# Patient Record
Sex: Female | Born: 1955
Health system: Southern US, Community
[De-identification: ages and names within clinical notes are randomized; demographics above are authoritative.]

## PROBLEM LIST (undated history)

## (undated) DIAGNOSIS — I10 Essential (primary) hypertension: Secondary | ICD-10-CM

## (undated) DIAGNOSIS — J029 Acute pharyngitis, unspecified: Secondary | ICD-10-CM

## (undated) DIAGNOSIS — M810 Age-related osteoporosis without current pathological fracture: Secondary | ICD-10-CM

## (undated) DIAGNOSIS — E049 Nontoxic goiter, unspecified: Secondary | ICD-10-CM

## (undated) DIAGNOSIS — R350 Frequency of micturition: Secondary | ICD-10-CM

## (undated) DIAGNOSIS — K922 Gastrointestinal hemorrhage, unspecified: Secondary | ICD-10-CM

## (undated) DIAGNOSIS — R5383 Other fatigue: Secondary | ICD-10-CM

## (undated) DIAGNOSIS — Z5189 Encounter for other specified aftercare: Secondary | ICD-10-CM

## (undated) DIAGNOSIS — N952 Postmenopausal atrophic vaginitis: Principal | ICD-10-CM

## (undated) DIAGNOSIS — M1990 Unspecified osteoarthritis, unspecified site: Secondary | ICD-10-CM

## (undated) DIAGNOSIS — N760 Acute vaginitis: Secondary | ICD-10-CM

## (undated) DIAGNOSIS — K5790 Diverticulosis of intestine, part unspecified, without perforation or abscess without bleeding: Secondary | ICD-10-CM

## (undated) DIAGNOSIS — K8020 Calculus of gallbladder without cholecystitis without obstruction: Secondary | ICD-10-CM

## (undated) DIAGNOSIS — R5381 Other malaise: Secondary | ICD-10-CM

## (undated) DIAGNOSIS — B019 Varicella without complication: Secondary | ICD-10-CM

## (undated) DIAGNOSIS — K469 Unspecified abdominal hernia without obstruction or gangrene: Secondary | ICD-10-CM

## (undated) DIAGNOSIS — N8110 Cystocele, unspecified: Secondary | ICD-10-CM

## (undated) DIAGNOSIS — R011 Cardiac murmur, unspecified: Secondary | ICD-10-CM

## (undated) DIAGNOSIS — E785 Hyperlipidemia, unspecified: Principal | ICD-10-CM

## (undated) DIAGNOSIS — F529 Unspecified sexual dysfunction not due to a substance or known physiological condition: Secondary | ICD-10-CM

## (undated) DIAGNOSIS — T7840XA Allergy, unspecified, initial encounter: Secondary | ICD-10-CM

## (undated) DIAGNOSIS — S32040A Wedge compression fracture of fourth lumbar vertebra, initial encounter for closed fracture: Secondary | ICD-10-CM

## (undated) DIAGNOSIS — R05 Cough: Secondary | ICD-10-CM

## (undated) HISTORY — DX: Calculus of gallbladder without cholecystitis without obstruction: K80.20

## (undated) HISTORY — DX: Varicella without complication: B01.9

## (undated) HISTORY — DX: Acute vaginitis: N76.0

## (undated) HISTORY — DX: Diverticulosis of intestine, part unspecified, without perforation or abscess without bleeding: K57.90

## (undated) HISTORY — DX: Unspecified osteoarthritis, unspecified site: M19.90

## (undated) HISTORY — DX: Unspecified sexual dysfunction not due to a substance or known physiological condition: F52.9

## (undated) HISTORY — DX: Acute pharyngitis, unspecified: J02.9

## (undated) HISTORY — DX: Other fatigue: R53.83

## (undated) HISTORY — PX: OTHER SURGICAL HISTORY: SHX169

## (undated) HISTORY — DX: Essential (primary) hypertension: I10

## (undated) HISTORY — DX: Cardiac murmur, unspecified: R01.1

## (undated) HISTORY — DX: Gastrointestinal hemorrhage, unspecified: K92.2

## (undated) HISTORY — PX: HERNIA REPAIR: SHX51

## (undated) HISTORY — DX: Hyperlipidemia, unspecified: E78.5

## (undated) HISTORY — PX: VARICOSE VEIN SURGERY: SHX832

## (undated) HISTORY — DX: Nontoxic goiter, unspecified: E04.9

## (undated) HISTORY — PX: COLONOSCOPY: SHX174

## (undated) HISTORY — PX: BREAST EXCISIONAL BIOPSY: SUR124

## (undated) HISTORY — DX: Cough: R05

## (undated) HISTORY — DX: Other malaise: R53.81

## (undated) HISTORY — PX: BIOPSY THYROID: PRO38

## (undated) HISTORY — DX: Allergy, unspecified, initial encounter: T78.40XA

## (undated) HISTORY — PX: BREAST BIOPSY: SHX20

## (undated) HISTORY — PX: TONSILLECTOMY: SHX5217

## (undated) HISTORY — DX: Unspecified abdominal hernia without obstruction or gangrene: K46.9

## (undated) HISTORY — DX: Cystocele, unspecified: N81.10

## (undated) HISTORY — DX: Encounter for other specified aftercare: Z51.89

## (undated) HISTORY — DX: Wedge compression fracture of fourth lumbar vertebra, initial encounter for closed fracture: S32.040A

## (undated) HISTORY — DX: Age-related osteoporosis without current pathological fracture: M81.0

## (undated) HISTORY — DX: Frequency of micturition: R35.0

## (undated) HISTORY — DX: Postmenopausal atrophic vaginitis: N95.2

---

## 1986-07-12 HISTORY — PX: VAGINAL HYSTERECTOMY: SUR661

## 1996-07-12 HISTORY — PX: CHOLECYSTECTOMY: SHX55

## 1997-07-12 HISTORY — PX: ERCP: SHX60

## 1997-07-12 HISTORY — PX: ESOPHAGOGASTRODUODENOSCOPY: SHX1529

## 2002-12-04 ENCOUNTER — Encounter: Payer: Self-pay | Admitting: Family Medicine

## 2002-12-04 ENCOUNTER — Encounter: Admission: RE | Admit: 2002-12-04 | Discharge: 2002-12-04 | Payer: Self-pay | Admitting: Family Medicine

## 2003-12-04 ENCOUNTER — Other Ambulatory Visit: Admission: RE | Admit: 2003-12-04 | Discharge: 2003-12-04 | Payer: Self-pay | Admitting: Family Medicine

## 2004-12-16 ENCOUNTER — Ambulatory Visit (HOSPITAL_COMMUNITY): Admission: RE | Admit: 2004-12-16 | Discharge: 2004-12-16 | Payer: Self-pay | Admitting: Family Medicine

## 2005-12-14 ENCOUNTER — Encounter: Admission: RE | Admit: 2005-12-14 | Discharge: 2006-01-05 | Payer: Self-pay | Admitting: Family Medicine

## 2006-12-14 ENCOUNTER — Other Ambulatory Visit: Admission: RE | Admit: 2006-12-14 | Discharge: 2006-12-14 | Payer: Self-pay | Admitting: Family Medicine

## 2006-12-21 ENCOUNTER — Ambulatory Visit (HOSPITAL_COMMUNITY): Admission: RE | Admit: 2006-12-21 | Discharge: 2006-12-21 | Payer: Self-pay | Admitting: Family Medicine

## 2008-01-04 ENCOUNTER — Ambulatory Visit (HOSPITAL_COMMUNITY): Admission: RE | Admit: 2008-01-04 | Discharge: 2008-01-04 | Payer: Self-pay | Admitting: Gastroenterology

## 2009-05-09 ENCOUNTER — Ambulatory Visit (HOSPITAL_COMMUNITY): Admission: RE | Admit: 2009-05-09 | Discharge: 2009-05-09 | Payer: Self-pay | Admitting: Family Medicine

## 2011-01-25 ENCOUNTER — Ambulatory Visit (HOSPITAL_BASED_OUTPATIENT_CLINIC_OR_DEPARTMENT_OTHER)
Admission: RE | Admit: 2011-01-25 | Discharge: 2011-01-25 | Disposition: A | Discharge status: Home / Self Care | Payer: 59 | Source: Ambulatory Visit | Attending: Family Medicine | Admitting: Family Medicine

## 2011-01-25 ENCOUNTER — Other Ambulatory Visit: Payer: Self-pay | Admitting: Family Medicine

## 2011-01-25 ENCOUNTER — Ambulatory Visit (INDEPENDENT_AMBULATORY_CARE_PROVIDER_SITE_OTHER): Payer: 59 | Admitting: Family Medicine

## 2011-01-25 ENCOUNTER — Encounter: Payer: Self-pay | Admitting: Family Medicine

## 2011-01-25 DIAGNOSIS — M25569 Pain in unspecified knee: Secondary | ICD-10-CM | POA: Insufficient documentation

## 2011-01-25 DIAGNOSIS — K5730 Diverticulosis of large intestine without perforation or abscess without bleeding: Secondary | ICD-10-CM

## 2011-01-25 DIAGNOSIS — F659 Paraphilia, unspecified: Secondary | ICD-10-CM

## 2011-01-25 DIAGNOSIS — B059 Measles without complication: Secondary | ICD-10-CM | POA: Insufficient documentation

## 2011-01-25 DIAGNOSIS — N952 Postmenopausal atrophic vaginitis: Secondary | ICD-10-CM | POA: Insufficient documentation

## 2011-01-25 DIAGNOSIS — M25562 Pain in left knee: Secondary | ICD-10-CM

## 2011-01-25 DIAGNOSIS — M1710 Unilateral primary osteoarthritis, unspecified knee: Secondary | ICD-10-CM

## 2011-01-25 DIAGNOSIS — B269 Mumps without complication: Secondary | ICD-10-CM | POA: Insufficient documentation

## 2011-01-25 DIAGNOSIS — E049 Nontoxic goiter, unspecified: Secondary | ICD-10-CM | POA: Insufficient documentation

## 2011-01-25 DIAGNOSIS — I10 Essential (primary) hypertension: Secondary | ICD-10-CM | POA: Insufficient documentation

## 2011-01-25 DIAGNOSIS — Z889 Allergy status to unspecified drugs, medicaments and biological substances status: Secondary | ICD-10-CM

## 2011-01-25 DIAGNOSIS — F529 Unspecified sexual dysfunction not due to a substance or known physiological condition: Secondary | ICD-10-CM

## 2011-01-25 DIAGNOSIS — Z9109 Other allergy status, other than to drugs and biological substances: Secondary | ICD-10-CM

## 2011-01-25 DIAGNOSIS — IMO0002 Reserved for concepts with insufficient information to code with codable children: Secondary | ICD-10-CM | POA: Insufficient documentation

## 2011-01-25 DIAGNOSIS — M1990 Unspecified osteoarthritis, unspecified site: Secondary | ICD-10-CM

## 2011-01-25 DIAGNOSIS — B019 Varicella without complication: Secondary | ICD-10-CM | POA: Insufficient documentation

## 2011-01-25 DIAGNOSIS — S86919A Strain of unspecified muscle(s) and tendon(s) at lower leg level, unspecified leg, initial encounter: Secondary | ICD-10-CM

## 2011-01-25 DIAGNOSIS — M129 Arthropathy, unspecified: Secondary | ICD-10-CM

## 2011-01-25 DIAGNOSIS — K5790 Diverticulosis of intestine, part unspecified, without perforation or abscess without bleeding: Secondary | ICD-10-CM | POA: Insufficient documentation

## 2011-01-25 DIAGNOSIS — S838X9A Sprain of other specified parts of unspecified knee, initial encounter: Secondary | ICD-10-CM

## 2011-01-25 DIAGNOSIS — J302 Other seasonal allergic rhinitis: Secondary | ICD-10-CM | POA: Insufficient documentation

## 2011-01-25 DIAGNOSIS — Z0000 Encounter for general adult medical examination without abnormal findings: Secondary | ICD-10-CM | POA: Insufficient documentation

## 2011-01-25 DIAGNOSIS — R609 Edema, unspecified: Secondary | ICD-10-CM | POA: Insufficient documentation

## 2011-01-25 DIAGNOSIS — T7840XA Allergy, unspecified, initial encounter: Secondary | ICD-10-CM

## 2011-01-25 DIAGNOSIS — S86819A Strain of other muscle(s) and tendon(s) at lower leg level, unspecified leg, initial encounter: Secondary | ICD-10-CM

## 2011-01-25 DIAGNOSIS — M899 Disorder of bone, unspecified: Secondary | ICD-10-CM | POA: Insufficient documentation

## 2011-01-25 HISTORY — DX: Postmenopausal atrophic vaginitis: N95.2

## 2011-01-25 HISTORY — DX: Unspecified sexual dysfunction not due to a substance or known physiological condition: F52.9

## 2011-01-25 MED ORDER — CETIRIZINE HCL 10 MG PO TABS: 10.0000 mg | ORAL_TABLET | Freq: Every day | ORAL | Status: DC

## 2011-01-25 MED ORDER — CHLORTHALIDONE 25 MG PO TABS: 25.0000 mg | ORAL_TABLET | Freq: Every day | ORAL | Status: DC

## 2011-01-25 MED ORDER — MONTELUKAST SODIUM 10 MG PO TABS: 10.0000 mg | ORAL_TABLET | Freq: Every day | ORAL | Status: DC | PRN

## 2011-01-25 NOTE — Progress Notes (Signed)
Ann Barton [id number] [birth year] 01/25/2011      Progress Note New Patient  Subjective  Chief Complaint  Chief Complaint  Patient presents with  . Establish Care    new patient    HPI  Patient is a 55 year old Caucasian female in today to establish care. 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 She is concerned about some left knee pain which has been present for about a week and a half. She denies any trauma or falls. The anterior swelling and some lateral pain. She has a long history of crepitus in that knee and difficulty with stairs but symptoms have recently worsened. She denies any redness or warmth. Does have history of documented arthritis in the left hip and low back by x-ray roughly 3 years ago. Reports he has white coat syndrome blood pressures have been elevated in the past at the doctors when she checks her pressures upwards 120s to 130s over 70s to 80s. She'll home with her husband and 2 cats. Her other concerns are low libido she is presently using Biest with 0.625/testosterone 1mg /progesterone 50mg  cream pv qhs compounded by her local pharmacy but is not finding it helpful. 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 No recent illness, fevers, chills, CP, SOB, palp, GI or GU c/o. Has been wearing a brace to help with her knee pain this week and that has been marginally helpful  Past Medical History  Diagnosis Date  . Chicken pox as a child  . Mumps as a child  . Measles as a child  . Arthritis     left hip, low back  . Strain of knee o7/06/12    left  . Allergy     seasonal  . Hypertension   . Female sexual dysfunction 01/25/2011    Past Surgical History  Procedure Date  . Cholecystectomy 1998  . Tonsillectomy as a child  . Laser surgery on vericose veins     b/l  . In grown toenails removed     partial, b/l  . Hernia repair     right, inguinal  . Abdominal hysterectomy  1988    partial still has ovaries, uterine prolapse    Family History  Problem Relation Age of Onset  . Atrial fibrillation Mother   . Obesity Mother   . Diabetes Mother 72    Type 2  . Arthritis Mother     in right knee  . Heart disease Mother     atrial fibrillation, CHF  . Hypertension Father   . Ulcers Father   . Diabetes Maternal Grandmother     type 2?  . Heart attack Maternal Grandmother   . Hearing loss Maternal Grandmother     MI  . Parkinsonism Maternal Grandfather   . Stroke Paternal Grandmother     History   Social History  . Marital Status: Married    Spouse Name: N/A    Number of Children: N/A  . Years of Education: N/A   Occupational History  . Not on file.   Social History Main Topics  . Smoking status: Former Smoker -- 1.0 packs/day    Types: Cigarettes    Quit date: 07/12/1976  . Smokeless tobacco: Never Used  . Alcohol Use: Yes     occasionally  . Drug Use: No  . Sexually Active: Yes -- Female partner(s)   Other Topics Concern  .  Not on file   Social History Narrative  . No narrative on file    No current outpatient prescriptions on file prior to visit.    Allergies  Allergen Reactions  . Lisinopril Cough    Review of Systems  Review of Systems  Constitutional: Negative for fever, chills and malaise/fatigue.  HENT: Negative for hearing loss, nosebleeds and congestion.   Eyes: Negative for discharge.  Respiratory: Negative for cough, sputum production, shortness of breath and wheezing.   Cardiovascular: Negative for chest pain, palpitations and leg swelling.  Gastrointestinal: Negative for heartburn, nausea, vomiting, abdominal pain, diarrhea, constipation and blood in stool.  Genitourinary: Negative for dysuria, urgency, frequency and hematuria.  Musculoskeletal: Positive for back pain and joint pain. Negative for myalgias and falls.       Left knee pain and swelling x 1 1/2 weeks. Left hip and low back pain intermittently    Skin: Negative for rash.  Neurological: Negative for dizziness, tremors, sensory change, focal weakness, loss of consciousness, weakness and headaches.  Endo/Heme/Allergies: Negative for polydipsia. Does not bruise/bleed easily.  Psychiatric/Behavioral: Negative for depression and suicidal ideas. The patient is not nervous/anxious and does not have insomnia.     Objective  BP 143/95  Pulse 95  Temp(Src) 97.7 F (36.5 C) (Oral)  Ht 5' 7.5" (1.715 m)  Wt 176 lb 1.9 oz (79.888 kg)  BMI 27.18 kg/m2  SpO2 97%  Physical Exam  Physical Exam  Constitutional: She is oriented to person, place, and time and well-developed, well-nourished, and in no distress. No distress.  HENT:  Head: Normocephalic and atraumatic.  Right Ear: External ear normal.  Left Ear: External ear normal.  Nose: Nose normal.  Mouth/Throat: Oropharynx is clear and moist. No oropharyngeal exudate.  Eyes: Conjunctivae are normal. Pupils are equal, round, and reactive to light. Right eye exhibits no discharge. Left eye exhibits no discharge. No scleral icterus.  Neck: Normal range of motion. Neck supple. No thyromegaly present.  Cardiovascular: Normal rate, regular rhythm, normal heart sounds and intact distal pulses.   No murmur heard. Pulmonary/Chest: Effort normal and breath sounds normal. No respiratory distress. She has no wheezes. She has no rales.  Abdominal: Soft. Bowel sounds are normal. She exhibits no distension and no mass. There is no tenderness.  Musculoskeletal: Normal range of motion. She exhibits no edema and no tenderness.       Mild swelling lateral anterior knee, mild tender with palp over medial meniscus. No ligamentous laxity or crepitus noted.  Lymphadenopathy:    She has no cervical adenopathy.  Neurological: She is alert and oriented to person, place, and time. She has normal reflexes. No cranial nerve deficit. Coordination normal.  Skin: Skin is warm and dry. No rash noted. She is not  diaphoretic.  Psychiatric: Mood, memory and affect normal.       Assessment & Plan  Arthritis Symptoms manageable, encouraged ongoing physical activity and add fish oil caps, encouraged Ibuprofen infrequently and may use Tylenol prn for pain.  Allergic state May continue Cetirizine and Fluticasone prn, given a rx for Singulair prn 10mg  to try prn with savings card  Strain of knee Has been bothering her for roughly 1 1/2 weeks, mild swelling anteriolateral. No trauma. Consider arthritis vs bursitis vs meniscal trauma. Encouraged ice and Aspercreme tid and continue to brace, xray ordered, call if symptoms worsen  Hypertension Changed from HCTZ to Chlorthalidone 25mg  po daily to see if it helps her SE  Female sexual dysfunction Patient presently on  a compound of Estrogen, Progesterone and Testosterone pv qd, 5ml, compounded at her local pharmacy in [place] and she reports it is not helping. We will change her antihypertensive and she is asked to start an exercise regimen and consider some couples counseling if no improvements will readdress at next visit.  Preventative health care Did colonoscopy at 56 yo, repeat in 10 years, Does pelvic exams every 3 years, and does MGMs every 1-2 years, does annual labs with her job, copies scanned in to record today, all wnl  Diverticulosis Encouraged hi fiber diet, good fluid intake and exercise

## 2011-01-25 NOTE — Assessment & Plan Note (Signed)
Encouraged hi fiber diet, good fluid intake and exercise

## 2011-01-25 NOTE — Assessment & Plan Note (Signed)
Symptoms manageable, encouraged ongoing physical activity and add fish oil caps, encouraged Ibuprofen infrequently and may use Tylenol prn for pain.

## 2011-01-25 NOTE — Patient Instructions (Signed)

## 2011-01-25 NOTE — Assessment & Plan Note (Signed)
May continue Cetirizine and Fluticasone prn, given a rx for Singulair prn 10mg  to try prn with savings card

## 2011-01-25 NOTE — Assessment & Plan Note (Signed)
Changed from HCTZ to Chlorthalidone 25mg  po daily to see if it helps her SE

## 2011-01-25 NOTE — Assessment & Plan Note (Signed)
Has been bothering her for roughly 1 1/2 weeks, mild swelling anteriolateral. No trauma. Consider arthritis vs bursitis vs meniscal trauma. Encouraged ice and Aspercreme tid and continue to brace, xray ordered, call if symptoms worsen

## 2011-01-25 NOTE — Assessment & Plan Note (Signed)
Patient presently on a compound of Estrogen, Progesterone and Testosterone pv qd, 5ml, compounded at her local pharmacy in [place] and she reports it is not helping. We will change her antihypertensive and she is asked to start an exercise regimen and consider some couples counseling if no improvements will readdress at next visit.

## 2011-01-25 NOTE — Assessment & Plan Note (Signed)
Did colonoscopy at 55 yo, repeat in 10 years, Does pelvic exams every 3 years, and does MGMs every 1-2 years, does annual labs with her job, copies scanned in to record today, all wnl

## 2011-02-12 ENCOUNTER — Encounter: Payer: Self-pay | Admitting: Family Medicine

## 2011-02-22 ENCOUNTER — Other Ambulatory Visit: Payer: Self-pay | Admitting: Family Medicine

## 2011-02-22 DIAGNOSIS — I10 Essential (primary) hypertension: Secondary | ICD-10-CM

## 2011-02-22 NOTE — Telephone Encounter (Signed)
Patient needs clonidine refill, is interested in switching to the slow release, please contact patient

## 2011-02-22 NOTE — Telephone Encounter (Signed)
Pt states she is using for HTN.  She states that it was discussed at her last visit.  Please advise.

## 2011-02-22 NOTE — Telephone Encounter (Signed)
I have only met with this patient once and she did not mention this med. What does she take it for, what is the strength, how many times a day does she take it and how long has she been on it so I can decide what or if to switch her to.

## 2011-02-22 NOTE — Telephone Encounter (Addendum)
I have attempted to contact this patient by phone with the following results: left message to return my call on answering machine (home).  Per Barbara Cower, at [facility], pt has been receiving on a monthly basis for quite a while.  Last fill dates are: 6/27, 5/28, 4/30, 4/4, 3/6, 2/6, and 1/4.  Dosage is 0.1 mg po bid.

## 2011-02-23 MED ORDER — CLONIDINE HCL 0.1 MG PO TABS: 0.1000 mg | ORAL_TABLET | Freq: Two times a day (BID) | ORAL | Status: DC

## 2011-02-23 NOTE — Telephone Encounter (Signed)
Pt prefers tablets.  RX sent to pharm with 2 refills per Dr. Abner Greenspan.

## 2011-02-23 NOTE — Telephone Encounter (Signed)
Got it, missed it in the [facility] somehow. So the extended release that works well in primary care is actually a patch. She would start with the 0.1 mg transdermal patch applied topically once a week. So Disp: #4 with 2 rf and she should let us check her pressure in roughly 2 weeks to make sure this is the optimal dose for her. It is a brand name, so copay will be higher

## 2011-03-05 ENCOUNTER — Encounter: Payer: Self-pay | Admitting: Family Medicine

## 2011-03-05 ENCOUNTER — Ambulatory Visit (INDEPENDENT_AMBULATORY_CARE_PROVIDER_SITE_OTHER): Payer: 59 | Admitting: Family Medicine

## 2011-03-05 ENCOUNTER — Telehealth: Payer: Self-pay

## 2011-03-05 VITALS — BP 132/86 | HR 89 | Temp 98.3°F | Ht 67.5 in | Wt 177.8 lb

## 2011-03-05 DIAGNOSIS — J029 Acute pharyngitis, unspecified: Secondary | ICD-10-CM

## 2011-03-05 DIAGNOSIS — I10 Essential (primary) hypertension: Secondary | ICD-10-CM

## 2011-03-05 LAB — POCT RAPID STREP A (OFFICE): Rapid Strep A Screen: NEGATIVE

## 2011-03-05 MED ORDER — AMOXICILLIN 500 MG PO CAPS: 500.0000 mg | ORAL_CAPSULE | Freq: Three times a day (TID) | ORAL | Status: DC

## 2011-03-05 MED ORDER — LIDOCAINE VISCOUS 2 % MT SOLN: 5.0000 mL | OROMUCOSAL | Status: AC | PRN

## 2011-03-05 NOTE — Patient Instructions (Signed)
Pharyngitis (Viral and Bacterial) Pharyngitis is soreness (inflammation) or infection of the pharynx. It is also called a sore throat. CAUSES Most sore throats are caused by viruses and are part of a cold. However, some sore throats are caused by strep and other bacteria. Sore throats can also be caused by post nasal drip from draining sinuses, allergies and sometimes from sleeping with an open mouth. Infectious sore throats can be spread from person to person by coughing, sneezing and sharing cups or eating utensils. TREATMENT Sore throats that are viral usually last 3-4 days. Viral illness will get better without medications (antibiotics). Strep throat and other bacterial infections will usually begin to get better about 24-48 hours after you begin to take antibiotics. HOME CARE INSTRUCTIONS  If the caregiver feels there is a bacterial infection or if there is a positive strep test, they will prescribe an antibiotic. The full course of antibiotics must be taken!! If the full course of antibiotic is not taken, you or your child may become ill again. If you or your child has strep throat and do not finish all of the medication, serious heart or kidney diseases may develop.   Drink enough water and fluids to keep your urine clear or pale yellow.   Only take over-the-counter or prescription medicines for pain, discomfort or fever as directed by your caregiver.   Get lots of rest.   Gargle with salt water ( tsp. of salt in a glass of water) as often as every 1-2 hours as you need for comfort.   Hard candies may soothe the throat if individual is not at risk for choking. Throat sprays or lozenges may also be used.  SEEK MEDICAL CARE IF:  Large, tender lumps in the neck develop.   A rash develops.   Green, yellow-brown or bloody sputum is coughed up.   You or your child has an oral temperature above 102 F (38.9 C).   Your baby is older than 3 months with a rectal temperature of 100.5 F  (38.1 C) or higher for more than 1 day.  SEEK IMMEDIATE MEDICAL CARE IF:  A stiff neck develops.   You or your child are drooling or unable to swallow liquids.   You or your child are vomiting, unable to keep medications or liquids down.   You or your child has severe pain, unrelieved with recommended medications.   You or your child are having difficulty breathing (not due to stuffy nose).   You or your child are unable to fully open your mouth.   You or your child develop redness, swelling, or severe pain anywhere on the neck.   You or your child has an oral temperature above 102 F (38.9 C), not controlled by medicine.   Your baby is older than 3 months with a rectal temperature of 102 F (38.9 C) or higher.   Your baby is 3 months old or younger with a rectal temperature of 100.4 F (38 C) or higher.  MAKE SURE YOU:   Understand these instructions.   Will watch your condition.   Will get help right away if you are not doing well or get worse.  Document Released: 06/28/2005 Document Re-Released: 12/16/2009 [employer] Patient Information 2011 [employer]. 

## 2011-03-05 NOTE — Telephone Encounter (Signed)
OPENED ON ACCIDENT

## 2011-03-07 LAB — CULTURE, GROUP A STREP

## 2011-03-08 ENCOUNTER — Emergency Department (HOSPITAL_COMMUNITY)
Admission: EM | Admit: 2011-03-08 | Discharge: 2011-03-08 | Disposition: A | Discharge status: Home / Self Care | Payer: 59 | Attending: Emergency Medicine | Admitting: Emergency Medicine

## 2011-03-08 DIAGNOSIS — J029 Acute pharyngitis, unspecified: Secondary | ICD-10-CM | POA: Insufficient documentation

## 2011-03-08 DIAGNOSIS — I10 Essential (primary) hypertension: Secondary | ICD-10-CM | POA: Insufficient documentation

## 2011-03-08 DIAGNOSIS — D72819 Decreased white blood cell count, unspecified: Secondary | ICD-10-CM | POA: Insufficient documentation

## 2011-03-08 LAB — CBC
HCT: 36.8 % (ref 36.0–46.0)
Hemoglobin: 12.8 g/dL (ref 12.0–15.0)
MCH: 29.1 pg (ref 26.0–34.0)
MCHC: 34.8 g/dL (ref 30.0–36.0)
MCV: 83.6 fL (ref 78.0–100.0)

## 2011-03-08 LAB — DIFFERENTIAL
Basophils Absolute: 0 10*3/uL (ref 0.0–0.1)
Basophils Relative: 0 % (ref 0–1)
Lymphocytes Relative: 30 % (ref 12–46)
Lymphs Abs: 0.6 10*3/uL — ABNORMAL LOW (ref 0.7–4.0)
Neutro Abs: 0.8 10*3/uL — ABNORMAL LOW (ref 1.7–7.7)
Neutrophils Relative %: 34 % — ABNORMAL LOW (ref 43–77)
Promyelocytes Absolute: 0 %
nRBC: 0 /100 WBC

## 2011-03-08 LAB — COMPREHENSIVE METABOLIC PANEL
BUN: 11 mg/dL (ref 6–23)
CO2: 28 mEq/L (ref 19–32)
Calcium: 9.3 mg/dL (ref 8.4–10.5)
Chloride: 99 mEq/L (ref 96–112)
Creatinine, Ser: 0.6 mg/dL (ref 0.50–1.10)
GFR calc Af Amer: >60 mL/min (ref >60)
GFR calc non Af Amer: >60 mL/min (ref >60)
Glucose, Bld: 120 mg/dL — ABNORMAL HIGH (ref 70–99)
Total Bilirubin: 0.3 mg/dL (ref 0.3–1.2)

## 2011-03-08 LAB — RAPID STREP SCREEN (MED CTR MEBANE ONLY): Streptococcus, Group A Screen (Direct): NEGATIVE

## 2011-03-09 ENCOUNTER — Encounter: Payer: Self-pay | Admitting: Family Medicine

## 2011-03-09 DIAGNOSIS — J029 Acute pharyngitis, unspecified: Secondary | ICD-10-CM | POA: Insufficient documentation

## 2011-03-09 HISTORY — DX: Acute pharyngitis, unspecified: J02.9

## 2011-03-09 LAB — PATHOLOGIST SMEAR REVIEW

## 2011-03-09 NOTE — Assessment & Plan Note (Signed)
Patient in with 4 days of sudden onset sore throat, HA, nausea and anorexia. Pain with swallowing is worsening. Strep test is negative but due to severity of symptoms will  Send for confirmation and treat with antibiotics, fluids and rest, given work note for next 24 hours.

## 2011-03-09 NOTE — Progress Notes (Signed)
Ann Barton [id number] [birth year] 03/09/2011      Progress Note-Follow Up  Subjective  Chief Complaint  Chief Complaint  Patient presents with  . Sore Throat    X 4 days (had a little nausea and diarrhea on Tuesday)    HPI  Patient is a 55 year old Caucasian female who works as a Engineer, civil (consulting) in the pediatric unit. She's been struggling with 4 days now of worsening sore throat. She describes some mild nausea, anorexia headache and malaise. She had one episode of loose stool but that resolved. She did have some low-grade fevers and chills the beginning that appears to be improving as well. The pain is severe enough when she swallows for her to have decreased her by mouth intake. No chest congestion, chest pain, shortness of breath palpitations, GU complaints are noted at today's visit. Her past medical history standpoint she has had a tonsillectomy in the past and does struggle with allergic rhinitis but only takes her medications intermittently  Past Medical History  Diagnosis Date  . Chicken pox as a child  . Mumps as a child  . Measles as a child  . Arthritis     left hip, low back  . Strain of knee o7/06/12    left  . Allergy     seasonal  . Hypertension   . 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 Goiter   . Pharyngitis, acute 03/09/2011    Past Surgical History  Procedure Date  . Cholecystectomy 1998  . Tonsillectomy as a child  . Laser surgery on vericose veins     b/l  . In grown toenails removed     partial, b/l  . Hernia repair     right, inguinal  . Abdominal hysterectomy 1988    partial still has ovaries, uterine prolapse  . Biopsy thyroid     FNA, benign  . Breast biopsy     FNA, benign    Family History  Problem Relation Age of Onset  . Atrial fibrillation Mother   . Obesity Mother   . Diabetes Mother 36    Type 2  . Arthritis Mother     in right knee  . Heart disease Mother     atrial fibrillation, CHF  . Hypertension Father   . Ulcers Father   .  Diabetes Maternal Grandmother     type 2?  . Heart attack Maternal Grandmother   . Hearing loss Maternal Grandmother     MI  . Parkinsonism Maternal Grandfather   . Stroke Paternal Grandmother     History   Social History  . Marital Status: Married    Spouse Name: N/A    Number of Children: N/A  . Years of Education: N/A   Occupational History  . Not on file.   Social History Main Topics  . Smoking status: Former Smoker -- 1.0 packs/day    Types: Cigarettes    Quit date: 07/12/1976  . Smokeless tobacco: Never Used  . Alcohol Use: Yes     occasionally  . Drug Use: No  . Sexually Active: Yes -- Female partner(s)   Other Topics Concern  . Not on file   Social History Narrative  . No narrative on file    Current Outpatient Prescriptions on File Prior to Visit  Medication Sig Dispense Refill  . Biotin 5000 MCG CAPS Take 1 capsule by mouth daily.        Marland Kitchen CALCIUM-MAGNESIUM-VITAMIN D PO Take 2 tablets by mouth daily.        Marland Kitchen  chlorthalidone (HYGROTON) 25 MG tablet Take 1 tablet (25 mg total) by mouth daily.  30 tablet  3  . cloNIDine (CATAPRES) 0.1 MG tablet Take 1 tablet (0.1 mg total) by mouth 2 (two) times daily.  60 tablet  2  . cetirizine (ZYRTEC) 10 MG tablet Take 1 tablet (10 mg total) by mouth daily.  30 tablet  0  . fluticasone (FLONASE) 50 MCG/ACT nasal spray Place 1 spray into the nose daily as needed.        . Progesterone 50 MG SUPP Place vaginally.          Allergies  Allergen Reactions  . Lisinopril Cough    Review of Systems  Review of Systems  Constitutional: Positive for fever, chills and malaise/fatigue.  HENT: Positive for sore throat. Negative for congestion.   Eyes: Negative for discharge.  Respiratory: Negative for shortness of breath.   Cardiovascular: Negative for chest pain, palpitations and leg swelling.  Gastrointestinal: Positive for nausea and diarrhea. Negative for vomiting and abdominal pain.  Genitourinary: Negative for dysuria.    Musculoskeletal: Negative for falls.  Skin: Negative for rash.  Neurological: Negative for loss of consciousness and headaches.  Endo/Heme/Allergies: Negative for polydipsia.  Psychiatric/Behavioral: Negative for depression and suicidal ideas. The patient is not nervous/anxious and does not have insomnia.     Objective  BP 132/86  Pulse 89  Temp(Src) 98.3 F (36.8 C) (Oral)  Ht 5' 7.5" (1.715 m)  Wt 177 lb 12.8 oz (80.65 kg)  BMI 27.44 kg/m2  SpO2 98%  Physical Exam  Physical Exam  Constitutional: She is oriented to person, place, and time and well-developed, well-nourished, and in no distress. No distress.  HENT:  Head: Normocephalic and atraumatic.  Right Ear: External ear normal.  Left Ear: External ear normal.  Nose: Nose normal.  Mouth/Throat: No oropharyngeal exudate.       Oropharynx ertheatous and edematous, no open ulcerous or pustular lesions  Eyes: Conjunctivae are normal.  Neck: Neck supple. No thyromegaly present.  Cardiovascular: Normal rate, regular rhythm and normal heart sounds.   No murmur heard. Pulmonary/Chest: Effort normal and breath sounds normal. She has no wheezes.  Abdominal: She exhibits no distension and no mass.  Musculoskeletal: She exhibits no edema.  Lymphadenopathy:    She has cervical adenopathy.  Neurological: She is alert and oriented to person, place, and time.  Skin: Skin is warm and dry. No rash noted. She is not diaphoretic.  Psychiatric: Memory, affect and judgment normal.    No results found for this basename: TSH   Lab Results  Component Value Date   WBC 2.1* 03/08/2011   HGB 12.8 03/08/2011   HCT 36.8 03/08/2011   MCV 83.6 03/08/2011   PLT 160 03/08/2011   Lab Results  Component Value Date   CREATININE 0.60 03/08/2011   BUN 11 03/08/2011   NA 138 03/08/2011   K 3.3* 03/08/2011   CL 99 03/08/2011   CO2 28 03/08/2011   Lab Results  Component Value Date   ALT 38* 03/08/2011   AST 24 03/08/2011   ALKPHOS 84 03/08/2011    BILITOT 0.3 03/08/2011     Assessment & Plan  Hypertension Adequately controlled despite acute illness, no change in therapy at today's visit  Pharyngitis, acute Patient in with 4 days of sudden onset sore throat, HA, nausea and anorexia. Pain with swallowing is worsening. Strep test is negative but due to severity of symptoms will  Send for confirmation and treat with antibiotics,  fluids and rest, given work note for next 24 hours.

## 2011-03-09 NOTE — Assessment & Plan Note (Signed)
Adequately controlled despite acute illness, no change in therapy at today's visit

## 2011-03-10 ENCOUNTER — Encounter: Payer: Self-pay | Admitting: Family Medicine

## 2011-03-10 ENCOUNTER — Ambulatory Visit (INDEPENDENT_AMBULATORY_CARE_PROVIDER_SITE_OTHER): Payer: 59 | Admitting: Family Medicine

## 2011-03-10 VITALS — BP 118/88 | HR 101 | Temp 97.9°F | Ht 67.5 in | Wt 172.8 lb

## 2011-03-10 DIAGNOSIS — E860 Dehydration: Secondary | ICD-10-CM

## 2011-03-10 DIAGNOSIS — I10 Essential (primary) hypertension: Secondary | ICD-10-CM

## 2011-03-10 DIAGNOSIS — J029 Acute pharyngitis, unspecified: Secondary | ICD-10-CM

## 2011-03-10 DIAGNOSIS — E876 Hypokalemia: Secondary | ICD-10-CM

## 2011-03-10 DIAGNOSIS — D709 Neutropenia, unspecified: Secondary | ICD-10-CM | POA: Insufficient documentation

## 2011-03-10 MED ORDER — METHYLPREDNISOLONE 4 MG PO KIT: PACK | ORAL | Status: AC

## 2011-03-10 NOTE — Assessment & Plan Note (Signed)
Encouraged to go home and add a Gatorade beverage to her routine and call if any concerns

## 2011-03-10 NOTE — Assessment & Plan Note (Signed)
Pharyngitis persists. Patient started with symptoms on 8/21 and she continues to suffer. She continues to have fevers and chills sore throat. At her last visit we started antibiotics but her strep culture was negative so she stopped it. 2 days ago she noted up in the ER due to severe pain. She was given some Dilaudid which gave her a headache but then she was also given Percocet which has been helping. Lab work did show a decreased WBC. At this point she needs to stay out of work longer than his catheter out until 03/17/2011. She is to increase rest and fluids if symptoms worsen again she will start a Medrol Dosepak to restart her amoxicillin and notify our office.

## 2011-03-10 NOTE — Patient Instructions (Signed)
Dehydration Dehydration is the reduction of water and fluid from the body to a level below that required for proper functioning. CAUSES Dehydration occurs when there is excessive fluid loss from the body or when loss of normal fluids is not adequately replaced.  Loss of fluids occurs in vomiting, diarrhea, excessive sweating, excessive urine output, or excessive loss of fluid from the lungs (as occurs in fever or in patients on a ventilator).   Inadequate fluid replacement occurs with nausea or decreased appetite due to illness, sore throat, or mouth pain.  SYMPTOMS Mild dehydration  Thirst (infants and young children may not be able to tell you they are thirsty).   Dry lips.   Slightly dry mouth membranes.  Moderate dehydration  Very dry mouth membranes.   Sunken eyes.   Sunken soft spot (fontanelle) on infant's head.   Skin does not bounce back quickly when lightly pinched and released.   Decreased urine production.   Decreased tear production.  Severe dehydration  Rapid, weak pulse (more than 100 beats per minute at rest).   Cold hands and feet.   Loss of ability to sweat in spite of heat and temperature.   Rapid breathing.   Blue lips.   Confusion, lethargy, difficult to arouse.   Minimal urine production.   No tears.  DIAGNOSIS Your caregiver will diagnose dehydration based on your symptoms and your exam. Blood and urine tests will help confirm the diagnosis. The diagnostic evaluation should also identify the cause of dehydration. PREVENTION The body depends on a proper balance of fluid and salts (electrolytes) for normal function. Adequate fluid intake in the presence of illness or other stresses (such as extreme exercise) is important.  TREATMENT  Mild dehydration is safe to self-treat for most ages as long as it does not worsen. Contact your caregiver for even mild dehydration in infants and the elderly.   In teenagers and adults with moderate  dehydration, careful home treatment (as outlined below) can be safe. Phone contact with a caregiver is advised. Children under 66 years of age with moderate dehydration should see a caregiver.   If you or your child is severely dehydrated, go to a hospital for treatment. Intravenous (IV) fluids will quickly reverse dehydration and are often lifesaving in young children, infants, and elderly persons.  HOME CARE INSTRUCTIONS Small amounts of fluids should be taken frequently. Large amounts at one time may not be tolerated. Plain water may be harmful in infants and the elderly. Oral rehydration solutions (ORS) are available at pharmacies and grocery stores. ORS replaces water and important electrolytes in proper proportions. Sports drinks are not as effective as ORS and may be harmful because the sugar can make diarrhea worse.  As a general guideline for children, replace any new fluid losses from diarrhea and/or vomiting with ORS as follows:   If your child weighs 22 pounds or under (10 kg or less), give 60-120 mL (1/4-1/2 cup or 2-4 ounces) of ORS for each diarrheal stool or vomiting episode.   If your child weighs more than 22 pounds (more than 10 kg), give 120-240 mL (1/2-1 cup or 4-8 ounces) of ORS for each diarrheal stool or vomiting episode.  If your child is vomiting, it may be helpful to give the above ORS replacement in 5 mL (1 teaspoon) amounts every 5 minutes and increase as tolerated.   While correcting for dehydration, children should eat normally. However, foods high in sugar should be avoided because they may worsen diarrhea. Large  amounts of carbonated soft drinks, juice, gelatin desserts, and other highly sugared drinks should be avoided.   After correction of dehydration, other liquids that are appealing to the child may be added. Children should drink small amounts of fluids frequently and fluids should be increased as tolerated. Children should drink enough fluids to keep urine  clear or pale yellow.  Adults should eat normally while drinking more fluids than usual. Drink small amounts of fluids frequently and increase the amount as tolerated. Drink enough fluids to keep urine clear or pale yellow. Broths, weak decaffeinated tea, lemon-lime soft drinks (allowed to go flat), and ORS replace fluids and electrolytes.  Avoid:  Carbonated drinks.  Juice.   Extremely hot or cold fluids.   Caffeine drinks.   Fatty, greasy foods.   Alcohol.  Tobacco.   Too much intake of anything at one time.   Gelatin desserts.    Probiotics are active cultures of beneficial bacteria. They may lessen the amount and number of diarrheal stools in adults. Probiotics can be found in yogurt with active cultures and in supplements.   Wash your hands well to avoid spreading germs (bacteria) and viruses.   Antidiarrheal medicines are not recommended for infants and children.   Only take over-the-counter or prescription medicines for pain, discomfort, or fever as directed by your caregiver. Do not give aspirin to children.   For adults with dehydration, ask your caregiver if you should continue all prescribed and over-the-counter medicines.   If your caregiver has given you a follow-up appointment, it is very important to keep that appointment. Not keeping the appointment could result in a lasting (chronic) or permanent injury and disability. If there is any problem keeping the appointment, you must call to reschedule.  SEEK IMMEDIATE MEDICAL CARE IF:  You are unable to keep fluids down or other symptoms become worse despite treatment.   Vomiting or diarrhea develops and becomes persistent.   There is vomiting of blood or green matter (bile).   There is blood in the stool or the stools are black and tarry.   There is no urine output in 6 to 8 hours or there is only a small amount of very dark urine.   Abdominal pain develops, increases, or localizes.   You or your child has an  oral temperature above 104, not controlled by medicine.   Your baby is older than 3 months with a rectal temperature of 102.65F (38.9 C) or higher.   Your baby is 15 months old or younger with a rectal temperature of 100.4 F (38 C) or higher.   You develop excessive weakness, dizziness, fainting, or extreme thirst.   You develop a rash, stiff neck, severe headache, or you become irritable, sleepy, or difficult to awaken.  MAKE SURE YOU:  Understand these instructions.   Will watch your condition.   Will get help right away if you are not doing well or get worse.  Document Released: 06/28/2005 Document Re-Released: 09/22/2009 [facility] Patient Information 2011 [place], [place].

## 2011-03-10 NOTE — Assessment & Plan Note (Signed)
Patient with poor by mouth intake and concentrated urine on report. Clinically dehydrated with dry mucous membranes. She's agreed to go home and push by mouth fluids. He did receive some IV fluids in the ER and felt somewhat better after that. Encouraged to start some Gatorade and ginger ale and take a sip of either one every 5 minutes or until she begins to improve symptomatically.

## 2011-03-10 NOTE — Assessment & Plan Note (Signed)
Likely secondary to her viral pharyngitis, will need repeat CBC after recovery

## 2011-03-10 NOTE — Progress Notes (Signed)
Ann Barton [id number] Sep 25, 1955 03/10/2011      Progress Note-Follow Up  Subjective  Chief Complaint  Chief Complaint  Patient presents with  . Follow-up    1 week follow up- pt also was seen at ER on Monday- given IV fluids    HPI  Patient is pain-old Caucasian female who is in today for followup on pharyngitis. She has been ill now since 03/02/2011 with sore throat, fevers, chills, malaise, myalgias, anorexia. She was seen on 8/24 here in the office and her strep culture was negative. She took a few days of amoxicillin but when the culture confirmation came back negative she stopped it. Unfortunately her symptoms have persisted and she began to have such severe pain and difficulty eating that on 8/27 she presented to the: ER. Laboratory did reveal a drop in her WBCs and a drop in her potassium. She was given IV fluids and IV Dilaudid. The Dilaudid gave her a headache but the fluid seemed to help her symptoms improve slightly. She denies chest pain, wheezing or shortness of breath. She denies persistent headache. He notes very minimal nasal congestion. No ear pain. No nausea, vomiting, diarrhea. She is scheduled to go back to do a shift to [facility] this coming weekend and is not recovered thus far. She brings in FMLA forms to be completed.  Review of Systems  Constitutional: Positive for fever, chills and malaise/fatigue.  HENT: Positive for sore throat. Negative for congestion.   Eyes: Negative for discharge.  Respiratory: Positive for cough. Negative for shortness of breath.   Cardiovascular: Negative for chest pain, palpitations and leg swelling.  Gastrointestinal: Negative for nausea, abdominal pain and diarrhea.  Genitourinary: Negative for dysuria.  Musculoskeletal: Positive for myalgias. Negative for falls.  Skin: Negative for rash.  Neurological: Positive for weakness. Negative for loss of consciousness and headaches.  Endo/Heme/Allergies: Negative for  polydipsia.  Psychiatric/Behavioral: Negative for depression and suicidal ideas. The patient is not nervous/anxious and does not have insomnia.     Past Medical History  Diagnosis Date  . Chicken pox as a child  . Mumps as a child  . Measles as a child  . Arthritis     left hip, low back  . Strain of knee o7/06/12    left  . Allergy     seasonal  . Hypertension   . 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 Goiter   . Pharyngitis, acute 03/09/2011    Past Surgical History  Procedure Date  . Cholecystectomy 1998  . Tonsillectomy as a child  . Laser surgery on vericose veins     b/l  . In grown toenails removed     partial, b/l  . Hernia repair     right, inguinal  . Abdominal hysterectomy 1988    partial still has ovaries, uterine prolapse  . Biopsy thyroid     FNA, benign  . Breast biopsy     FNA, benign    Family History  Problem Relation Age of Onset  . Atrial fibrillation Mother   . Obesity Mother   . Diabetes Mother 76    Type 2  . Arthritis Mother     in right knee  . Heart disease Mother     atrial fibrillation, CHF  . Hypertension Father   . Ulcers Father   . Diabetes Maternal Grandmother     type 2?  . Heart attack Maternal Grandmother   . Hearing loss Maternal Grandmother  MI  . Parkinsonism Maternal Grandfather   . Stroke Paternal Grandmother     History   Social History  . Marital Status: Married    Spouse Name: N/A    Number of Children: N/A  . Years of Education: N/A   Occupational History  . Not on file.   Social History Main Topics  . Smoking status: Former Smoker -- 1.0 packs/day    Types: Cigarettes    Quit date: 07/12/1976  . Smokeless tobacco: Never Used  . Alcohol Use: Yes     occasionally  . Drug Use: No  . Sexually Active: Yes -- Female partner(s)   Other Topics Concern  . Not on file   Social History Narrative  . No narrative on file    Current Outpatient Prescriptions on File Prior to Visit  Medication Sig  Dispense Refill  . Biotin 5000 MCG CAPS Take 1 capsule by mouth daily.        Marland Kitchen CALCIUM-MAGNESIUM-VITAMIN D PO Take 2 tablets by mouth daily.        . cetirizine (ZYRTEC) 10 MG tablet Take 1 tablet (10 mg total) by mouth daily.  30 tablet  0  . chlorthalidone (HYGROTON) 25 MG tablet Take 1 tablet (25 mg total) by mouth daily.  30 tablet  3  . cloNIDine (CATAPRES) 0.1 MG tablet Take 1 tablet (0.1 mg total) by mouth 2 (two) times daily.  60 tablet  2  . fluticasone (FLONASE) 50 MCG/ACT nasal spray Place 1 spray into the nose daily as needed.        Marland Kitchen KRILL OIL PO Take 1 tablet by mouth daily.        Marland Kitchen lidocaine (XYLOCAINE) 2 % solution Take 5 mLs by mouth every 4 (four) hours as needed for pain (pain, swish and spit).  100 mL  1  . Multiple Vitamin (MULTIVITAMIN) tablet Take 1 tablet by mouth daily.        . Progesterone 50 MG SUPP Place vaginally.          Allergies  Allergen Reactions  . Lisinopril Cough    Review of Systems   Objective  BP 118/88  Pulse 101  Temp(Src) 97.9 F (36.6 C) (Oral)  Ht 5' 7.5" (1.715 m)  Wt 172 lb 12.8 oz (78.382 kg)  BMI 26.66 kg/m2  SpO2 96%  Physical Exam  Physical Exam  Constitutional: She is oriented to person, place, and time and well-developed, well-nourished, and in no distress. No distress.  HENT:  Head: Normocephalic and atraumatic.       Dry mucus membranes and erythema in oropharynx present  Eyes: Conjunctivae are normal.  Neck: Neck supple. No thyromegaly present.  Cardiovascular: Normal rate, regular rhythm and normal heart sounds.   No murmur heard. Pulmonary/Chest: Effort normal and breath sounds normal. She has no wheezes.  Abdominal: She exhibits no distension and no mass.  Musculoskeletal: She exhibits no edema.  Lymphadenopathy:    She has cervical adenopathy.  Neurological: She is alert and oriented to person, place, and time.  Skin: Skin is warm and dry. No rash noted. She is not diaphoretic.  Psychiatric: Memory,  affect and judgment normal.    No results found for this basename: TSH   Lab Results  Component Value Date   WBC 2.1* 03/08/2011   HGB 12.8 03/08/2011   HCT 36.8 03/08/2011   MCV 83.6 03/08/2011   PLT 160 03/08/2011   Lab Results  Component Value Date   CREATININE 0.60  03/08/2011   BUN 11 03/08/2011   NA 138 03/08/2011   K 3.3* 03/08/2011   CL 99 03/08/2011   CO2 28 03/08/2011   Lab Results  Component Value Date   ALT 38* 03/08/2011   AST 24 03/08/2011   ALKPHOS 84 03/08/2011   BILITOT 0.3 03/08/2011     Assessment & Plan  Pharyngitis, acute Pharyngitis persists. Patient started with symptoms on 8/21 and she continues to suffer. She continues to have fevers and chills sore throat. At her last visit we started antibiotics but her strep culture was negative so she stopped it. 2 days ago she noted up in the ER due to severe pain. She was given some Dilaudid which gave her a headache but then she was also given Percocet which has been helping. Lab work did show a decreased WBC. At this point she needs to stay out of work longer than his catheter out until 03/17/2011. She is to increase rest and fluids if symptoms worsen again she will start a Medrol Dosepak to restart her amoxicillin and notify our office.  Hypertension Not taking her BP meds at this time due to difficulty swallowing. She is advised it is acceptable to hold chlorthalidone will she's not drinking well but she should restart the clonidine due to the problems we see with gout hypertension. She is in agreement.  Dehydration Patient with poor by mouth intake and concentrated urine on report. Clinically dehydrated with dry mucous membranes. She's agreed to go home and push by mouth fluids. He did receive some IV fluids in the ER and felt somewhat better after that. Encouraged to start some Gatorade and ginger ale and take a sip of either one every 5 minutes or until she begins to improve symptomatically.  Hypokalemia Encouraged to  go home and add a Gatorade beverage to her routine and call if any concerns  Neutropenia Likely secondary to her viral pharyngitis, will need repeat CBC after recovery

## 2011-03-10 NOTE — Assessment & Plan Note (Signed)
Not taking her BP meds at this time due to difficulty swallowing. She is advised it is acceptable to hold chlorthalidone will she's not drinking well but she should restart the clonidine due to the problems we see with gout hypertension. She is in agreement.

## 2011-04-21 ENCOUNTER — Other Ambulatory Visit: Payer: Self-pay

## 2011-04-21 DIAGNOSIS — I10 Essential (primary) hypertension: Secondary | ICD-10-CM

## 2011-04-21 MED ORDER — MONTELUKAST SODIUM 10 MG PO TABS: 10.0000 mg | ORAL_TABLET | Freq: Every day | ORAL | Status: DC

## 2011-04-21 MED ORDER — CLONIDINE HCL 0.1 MG PO TABS: 0.1000 mg | ORAL_TABLET | Freq: Two times a day (BID) | ORAL | Status: DC

## 2011-04-27 ENCOUNTER — Encounter: Payer: Self-pay | Admitting: Family Medicine

## 2011-04-27 ENCOUNTER — Other Ambulatory Visit (HOSPITAL_COMMUNITY)
Admission: RE | Admit: 2011-04-27 | Discharge: 2011-04-27 | Disposition: A | Discharge status: Home / Self Care | Payer: 59 | Source: Ambulatory Visit | Attending: Family Medicine | Admitting: Family Medicine

## 2011-04-27 ENCOUNTER — Ambulatory Visit (INDEPENDENT_AMBULATORY_CARE_PROVIDER_SITE_OTHER): Payer: 59 | Admitting: Family Medicine

## 2011-04-27 DIAGNOSIS — E876 Hypokalemia: Secondary | ICD-10-CM

## 2011-04-27 DIAGNOSIS — E049 Nontoxic goiter, unspecified: Secondary | ICD-10-CM

## 2011-04-27 DIAGNOSIS — F659 Paraphilia, unspecified: Secondary | ICD-10-CM

## 2011-04-27 DIAGNOSIS — I10 Essential (primary) hypertension: Secondary | ICD-10-CM

## 2011-04-27 DIAGNOSIS — Z113 Encounter for screening for infections with a predominantly sexual mode of transmission: Secondary | ICD-10-CM | POA: Insufficient documentation

## 2011-04-27 DIAGNOSIS — E860 Dehydration: Secondary | ICD-10-CM

## 2011-04-27 DIAGNOSIS — N760 Acute vaginitis: Secondary | ICD-10-CM

## 2011-04-27 DIAGNOSIS — F529 Unspecified sexual dysfunction not due to a substance or known physiological condition: Secondary | ICD-10-CM

## 2011-04-27 DIAGNOSIS — R350 Frequency of micturition: Secondary | ICD-10-CM

## 2011-04-27 DIAGNOSIS — Z0000 Encounter for general adult medical examination without abnormal findings: Secondary | ICD-10-CM

## 2011-04-27 DIAGNOSIS — T7840XA Allergy, unspecified, initial encounter: Secondary | ICD-10-CM

## 2011-04-27 DIAGNOSIS — Z01419 Encounter for gynecological examination (general) (routine) without abnormal findings: Secondary | ICD-10-CM | POA: Insufficient documentation

## 2011-04-27 HISTORY — DX: Frequency of micturition: R35.0

## 2011-04-27 HISTORY — DX: Acute vaginitis: N76.0

## 2011-04-27 LAB — POCT URINALYSIS DIPSTICK
Bilirubin, UA: NEGATIVE
Ketones, UA: NEGATIVE
Protein, UA: NEGATIVE
pH, UA: 7.5

## 2011-04-27 LAB — CBC
HCT: 39.8 % (ref 36.0–46.0)
MCV: 87.9 fL (ref 78.0–100.0)
Platelets: 327 10*3/uL (ref 150–400)
RBC: 4.53 MIL/uL (ref 3.87–5.11)
RDW: 12.8 % (ref 11.5–15.5)
WBC: 5.3 10*3/uL (ref 4.0–10.5)

## 2011-04-27 LAB — RENAL FUNCTION PANEL
BUN: 17 mg/dL (ref 6–23)
Creatinine, Ser: 0.8 mg/dL (ref 0.4–1.2)
GFR: 77.76 mL/min (ref >60.00)
Glucose, Bld: 92 mg/dL (ref 70–99)
Sodium: 137 mEq/L (ref 135–145)

## 2011-04-27 LAB — HEPATIC FUNCTION PANEL
ALT: 14 U/L (ref 0–35)
AST: 20 U/L (ref 0–37)
Albumin: 4.4 g/dL (ref 3.5–5.2)
Total Bilirubin: 0.4 mg/dL (ref 0.3–1.2)

## 2011-04-27 LAB — LIPID PANEL
HDL: 39.9 mg/dL (ref >39.00)
Triglycerides: 138 mg/dL (ref 0.0–149.0)
VLDL: 27.6 mg/dL (ref 0.0–40.0)

## 2011-04-27 MED ORDER — CLONIDINE HCL 0.1 MG PO TABS: 0.1000 mg | ORAL_TABLET | Freq: Every day | ORAL | Status: DC

## 2011-04-27 MED ORDER — CHLORTHALIDONE 25 MG PO TABS: 25.0000 mg | ORAL_TABLET | Freq: Every day | ORAL | Status: DC

## 2011-04-27 NOTE — Assessment & Plan Note (Signed)
POCT urine is suspicious will send for culture

## 2011-04-27 NOTE — Assessment & Plan Note (Signed)
Is following with [facility] medical now in [place] who is supplementing her hormones now. Does feel her energy  And sex drive are improved.

## 2011-04-27 NOTE — Assessment & Plan Note (Signed)
Adequately controlled on current meds. No changes, continue chlorthalidone in am and Clonodine qhs. Minimize sodium

## 2011-04-27 NOTE — Assessment & Plan Note (Signed)
She has noted some recent pinkish discharge off and on for the past several months. The timing does coincide with her hormone initiation. Pap was taken today

## 2011-04-27 NOTE — Assessment & Plan Note (Signed)
No recent flares 

## 2011-04-27 NOTE — Assessment & Plan Note (Signed)
Well controlled on repeat. Continue Chlorthalidone 25 mg daily and she has changed her Clonidine to 0.1 mg po qhs, when she was taking the am dose she was feeling very tired and seeing bp around 90/60.

## 2011-04-27 NOTE — Assessment & Plan Note (Deleted)
Is following with [facility] medical now in [place] who is supplementing her hormones now. Does feel her energy  And sex drive are improved. 

## 2011-04-27 NOTE — Progress Notes (Signed)
Ann Barton [id number] 12-05-1955 04/27/2011      Progress Note New Patient  Subjective  Chief Complaint  Chief Complaint  Patient presents with  . Follow-up    3 month follow up    HPI  Patient is a 55 yo female in today with multiple concerns, she needs a check up and labs for her job. She is also noting some recent pinkish vaginal discharge. Sometimes brownish. Off and on for several months no pain or lesions associated. No fevers, chills. Symptoms does correlate with the initiation of hormones and increased sexual activity with her husband. No fevers, chills, abdominal or back pain. Does have some urinary denies hematuria or dysuria. No chest pain, palpitations, shortness of breath.  Past Medical History  Diagnosis Date  . Chicken pox as a child  . Mumps as a child  . Measles as a child  . Arthritis     left hip, low back  . Strain of knee o7/06/12    left  . Allergy     seasonal  . Hypertension   . 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 Goiter   . Pharyngitis, acute 03/09/2011  . Hypokalemia 03/10/2011  . Neutropenia 03/10/2011  . Urinary frequency 04/27/2011  . Vaginitis 04/27/2011    Past Surgical History  Procedure Date  . Cholecystectomy 1998  . Tonsillectomy as a child  . Laser surgery on vericose veins     b/l  . In grown toenails removed     partial, b/l  . Hernia repair     right, inguinal  . Abdominal hysterectomy 1988    partial still has ovaries, uterine prolapse  . Biopsy thyroid     FNA, benign  . Breast biopsy     FNA, benign    Family History  Problem Relation Age of Onset  . Atrial fibrillation Mother   . Obesity Mother   . Diabetes Mother 58    Type 2  . Arthritis Mother     in right knee  . Heart disease Mother     atrial fibrillation, CHF  . Hernia Mother     never recovered from repair  . Hypertension Father   . Ulcers Father   . Diabetes Maternal Grandmother     type 2?  . Heart attack Maternal Grandmother   . Hearing  loss Maternal Grandmother     MI  . Parkinsonism Maternal Grandfather   . Stroke Paternal Grandmother     History   Social History  . Marital Status: Married    Spouse Name: N/A    Number of Children: N/A  . Years of Education: N/A   Occupational History  . Not on file.   Social History Main Topics  . Smoking status: Former Smoker -- 1.0 packs/day    Types: Cigarettes    Quit date: 07/12/1976  . Smokeless tobacco: Never Used  . Alcohol Use: Yes     occasionally  . Drug Use: No  . Sexually Active: Yes -- Female partner(s)   Other Topics Concern  . Not on file   Social History Narrative  . No narrative on file    Current Outpatient Prescriptions on File Prior to Visit  Medication Sig Dispense Refill  . Biotin 5000 MCG CAPS Take 1 capsule by mouth daily.        Marland Kitchen CALCIUM-MAGNESIUM-VITAMIN D PO Take 2 tablets by mouth daily.        . cetirizine (ZYRTEC) 10 MG tablet Take 1 tablet (  10 mg total) by mouth daily.  30 tablet  0  . fluticasone (FLONASE) 50 MCG/ACT nasal spray Place 1 spray into the nose daily as needed.        Marland Kitchen KRILL OIL PO Take 1 tablet by mouth daily.        . montelukast (SINGULAIR) 10 MG tablet Take 1 tablet (10 mg total) by mouth daily.  30 tablet  2  . Multiple Vitamin (MULTIVITAMIN) tablet Take 1 tablet by mouth daily.        Marland Kitchen oxyCODONE-acetaminophen (PERCOCET) 5-325 MG per tablet Take 1 tablet by mouth every 6 (six) hours as needed.        . Progesterone 50 MG SUPP Place vaginally.          Allergies  Allergen Reactions  . Lisinopril Cough    Review of Systems  Review of Systems  Constitutional: Negative for fever, chills and malaise/fatigue.  HENT: Negative for hearing loss, nosebleeds and congestion.   Eyes: Negative for discharge.  Respiratory: Negative for cough, sputum production, shortness of breath and wheezing.   Cardiovascular: Negative for chest pain, palpitations and leg swelling.  Gastrointestinal: Negative for heartburn, nausea,  vomiting, abdominal pain, diarrhea, constipation and blood in stool.  Genitourinary: Positive for frequency. Negative for dysuria, urgency and hematuria.  Musculoskeletal: Negative for myalgias, back pain and falls.  Skin: Negative for rash.  Neurological: Negative for dizziness, tremors, sensory change, focal weakness, loss of consciousness, weakness and headaches.  Endo/Heme/Allergies: Negative for polydipsia. Does not bruise/bleed easily.  Psychiatric/Behavioral: Negative for depression and suicidal ideas. The patient is not nervous/anxious and does not have insomnia.     Objective  BP 124/88  Pulse 91  Temp(Src) 97.6 F (36.4 C) (Oral)  Ht 5' 7.5" (1.715 m)  Wt 176 lb 12.8 oz (80.196 kg)  BMI 27.28 kg/m2  SpO2 100%  Physical Exam  Physical Exam  Constitutional: She is oriented to person, place, and time and well-developed, well-nourished, and in no distress. No distress.  HENT:  Head: Normocephalic and atraumatic.  Right Ear: External ear normal.  Left Ear: External ear normal.  Nose: Nose normal.  Mouth/Throat: Oropharynx is clear and moist. No oropharyngeal exudate.  Eyes: Conjunctivae are normal. Pupils are equal, round, and reactive to light. Right eye exhibits no discharge. Left eye exhibits no discharge. No scleral icterus.  Neck: Normal range of motion. Neck supple. No thyromegaly present.  Cardiovascular: Normal rate, regular rhythm, normal heart sounds and intact distal pulses.   No murmur heard. Pulmonary/Chest: Effort normal and breath sounds normal. No respiratory distress. She has no wheezes. She has no rales.  Abdominal: Soft. Bowel sounds are normal. She exhibits no distension and no mass. There is no tenderness.  Genitourinary: Vaginal discharge found.       Cervix surgically absent. Mucosa is erythematous and speckled. Whitish vaginal discharge noted today  Musculoskeletal: Normal range of motion. She exhibits no edema and no tenderness.  Lymphadenopathy:      She has no cervical adenopathy.  Neurological: She is alert and oriented to person, place, and time. She has normal reflexes. No cranial nerve deficit. Coordination normal.  Skin: Skin is warm and dry. No rash noted. She is not diaphoretic.  Psychiatric: Mood, memory and affect normal.       Assessment & Plan  Hypertension Well controlled on repeat. Continue Chlorthalidone 25 mg daily and she has changed her Clonidine to 0.1 mg po qhs, when she was taking the am dose she was  feeling very tired and seeing bp around 90/60.  Dehydration Mild again, encouraged increased fluid intake  Female sexual dysfunction Is following with [facility] medical now in [place] who is supplementing her hormones now. Does feel her energy  And sex drive are improved.  Hypokalemia Adequately controlled on current meds. No changes, continue chlorthalidone in am and Clonodine qhs. Minimize sodium  Allergic state No recent flares.   Urinary frequency POCT urine is suspicious will send for culture  Vaginitis She has noted some recent pinkish discharge off and on for the past several months. The timing does coincide with her hormone initiation. Pap was taken today

## 2011-04-27 NOTE — Assessment & Plan Note (Signed)
Mild again, encouraged increased fluid intake

## 2011-04-27 NOTE — Patient Instructions (Signed)

## 2011-04-30 LAB — URINE CULTURE

## 2011-05-03 MED ORDER — CIPROFLOXACIN HCL 500 MG PO TABS: 500.0000 mg | ORAL_TABLET | Freq: Two times a day (BID) | ORAL | Status: DC

## 2011-05-03 NOTE — Progress Notes (Signed)
Addended by: Court Joy on: 05/03/2011 03:18 PM   Modules accepted: Orders

## 2011-09-03 ENCOUNTER — Other Ambulatory Visit: Payer: Self-pay | Admitting: Family Medicine

## 2011-09-03 DIAGNOSIS — Z1231 Encounter for screening mammogram for malignant neoplasm of breast: Secondary | ICD-10-CM

## 2011-10-06 ENCOUNTER — Ambulatory Visit (HOSPITAL_COMMUNITY)
Admission: RE | Admit: 2011-10-06 | Discharge: 2011-10-06 | Disposition: A | Discharge status: Home / Self Care | Payer: 59 | Source: Ambulatory Visit | Attending: Family Medicine | Admitting: Family Medicine

## 2011-10-06 DIAGNOSIS — Z1231 Encounter for screening mammogram for malignant neoplasm of breast: Secondary | ICD-10-CM | POA: Insufficient documentation

## 2012-04-19 ENCOUNTER — Ambulatory Visit
Admission: RE | Admit: 2012-04-19 | Discharge: 2012-04-19 | Disposition: A | Discharge status: Home / Self Care | Payer: 59 | Source: Ambulatory Visit | Attending: Family Medicine | Admitting: Family Medicine

## 2012-04-19 ENCOUNTER — Encounter: Payer: Self-pay | Admitting: Family Medicine

## 2012-04-19 ENCOUNTER — Ambulatory Visit (INDEPENDENT_AMBULATORY_CARE_PROVIDER_SITE_OTHER): Payer: 59 | Admitting: Family Medicine

## 2012-04-19 VITALS — BP 133/83 | HR 98 | Temp 98.6°F | Ht 67.5 in | Wt 182.1 lb

## 2012-04-19 DIAGNOSIS — N952 Postmenopausal atrophic vaginitis: Secondary | ICD-10-CM

## 2012-04-19 DIAGNOSIS — N760 Acute vaginitis: Secondary | ICD-10-CM

## 2012-04-19 DIAGNOSIS — K5730 Diverticulosis of large intestine without perforation or abscess without bleeding: Secondary | ICD-10-CM

## 2012-04-19 DIAGNOSIS — R05 Cough: Secondary | ICD-10-CM

## 2012-04-19 DIAGNOSIS — D709 Neutropenia, unspecified: Secondary | ICD-10-CM

## 2012-04-19 DIAGNOSIS — Z0000 Encounter for general adult medical examination without abnormal findings: Secondary | ICD-10-CM

## 2012-04-19 DIAGNOSIS — I10 Essential (primary) hypertension: Secondary | ICD-10-CM

## 2012-04-19 DIAGNOSIS — R059 Cough, unspecified: Secondary | ICD-10-CM

## 2012-04-19 DIAGNOSIS — K5790 Diverticulosis of intestine, part unspecified, without perforation or abscess without bleeding: Secondary | ICD-10-CM

## 2012-04-19 DIAGNOSIS — T7840XA Allergy, unspecified, initial encounter: Secondary | ICD-10-CM

## 2012-04-19 DIAGNOSIS — E049 Nontoxic goiter, unspecified: Secondary | ICD-10-CM

## 2012-04-19 HISTORY — DX: Cough, unspecified: R05.9

## 2012-04-19 LAB — RENAL FUNCTION PANEL
Albumin: 4 g/dL (ref 3.5–5.2)
BUN: 18 mg/dL (ref 6–23)
CO2: 28 mEq/L (ref 19–32)
Calcium: 9.5 mg/dL (ref 8.4–10.5)
Chloride: 105 mEq/L (ref 96–112)
Creatinine, Ser: 0.7 mg/dL (ref 0.4–1.2)
GFR: 86 mL/min (ref >60.00)

## 2012-04-19 LAB — CBC
HCT: 40.1 % (ref 36.0–46.0)
Hemoglobin: 13.2 g/dL (ref 12.0–15.0)
Platelets: 245 10*3/uL (ref 150.0–400.0)
RBC: 4.5 Mil/uL (ref 3.87–5.11)
WBC: 6.2 10*3/uL (ref 4.5–10.5)

## 2012-04-19 LAB — HEPATIC FUNCTION PANEL
ALT: 14 U/L (ref 0–35)
AST: 18 U/L (ref 0–37)
Albumin: 4 g/dL (ref 3.5–5.2)
Alkaline Phosphatase: 48 U/L (ref 39–117)
Total Protein: 7.4 g/dL (ref 6.0–8.3)

## 2012-04-19 LAB — TSH: TSH: 0.98 u[IU]/mL (ref 0.35–5.50)

## 2012-04-19 MED ORDER — SALINE NASAL SPRAY 0.65 % NA SOLN: 2.0000 | NASAL | Status: DC | PRN

## 2012-04-19 MED ORDER — ESTROGENS, CONJUGATED 0.625 MG/GM VA CREA: TOPICAL_CREAM | Freq: Every day | VAGINAL | Status: DC

## 2012-04-19 NOTE — Assessment & Plan Note (Signed)
Normalized with last blood draw will repeat cbc today

## 2012-04-19 NOTE — Assessment & Plan Note (Signed)
Encouraged a daily probiotic

## 2012-04-19 NOTE — Assessment & Plan Note (Signed)
Persistent and worse in the morning but can occur all day and present for over 6 months, will proceed to CXR and patient offered referral to ENT for further consideration but she declines at this time. Will treat allergies somewhat more aggressively for now

## 2012-04-19 NOTE — Assessment & Plan Note (Signed)
May try temporarily increasing the Claritin to bid and adding nasal saline spray bid

## 2012-04-19 NOTE — Assessment & Plan Note (Signed)
Asymptomatic except for some gaseousness. Start a probiotic daily

## 2012-04-19 NOTE — Assessment & Plan Note (Signed)
Doing well, had flu shot 04/11/12. Had Tdap at work in last 10 years. MGM 3/13. Encouraged heart healthy diet and exercise, check annual labs

## 2012-04-19 NOTE — Patient Instructions (Addendum)
Atrophic Vaginitis Atrophic vaginitis is a problem of low levels of estrogen in women. This problem can happen at any age. It is most common in women who have gone through menopause ("the change").  HOW WILL I KNOW IF I HAVE THIS PROBLEM? You may have:  Trouble with peeing (urinating), such as:  Going to the bathroom often.  A hard time holding your pee until you reach a bathroom.  Leaking pee.  Having pain when you pee.  Itching or a burning feeling.  Vaginal bleeding and spotting.  Pain during sex.  Dryness of the vagina.  A yellow, bad-smelling fluid (discharge) coming from the vagina. HOW WILL MY DOCTOR CHECK FOR THIS PROBLEM?  During your exam, your doctor will likely find the problem.  If there is a vaginal fluid, it may be checked for infection. HOW WILL THIS PROBLEM BE TREATED? Keep the vulvar skin as clean as possible. Moisturizers and lubricants can help with some of the symptoms. Estrogen replacement can help. There are 2 ways to take estrogen:  Systemic estrogen gets estrogen to your whole body. It takes many weeks or months before the symptoms get better.  You take an estrogen pill.  You use a skin patch. This is a patch that you put on your skin.  If you still have your uterus, your doctor may ask you to take a hormone. Talk to your doctor about the right medicine for you.  Estrogen cream.  This puts estrogen only at the part of your body where you apply it. The cream is put into the vagina or put on the vulvar skin. For some women, estrogen cream works faster than pills or the patch. CAN ALL WOMEN WITH THIS PROBLEM USE ESTROGEN? No. Women with certain types of cancer, liver problems, or problems with blood clots should not take estrogen. Your doctor can help you decide the best treatment for your symptoms. Document Released: 12/15/2007 Document Revised: 09/20/2011 Document Reviewed: 12/15/2007 [facility] Patient Information 2013 [place],  [place].   Digestive Health by Sch[employer], daily

## 2012-04-19 NOTE — Assessment & Plan Note (Signed)
Patient started on Prempro cream vaginally every other day and then titrate down to lowest dose that is helpful. Pap at next visit

## 2012-04-19 NOTE — Assessment & Plan Note (Signed)
Adequately controlled at this time, no changes

## 2012-04-19 NOTE — Progress Notes (Signed)
Patient ID: Ann Barton, female   DOB: 1955-10-24, 56 y.o.   MRN: [id number] SHOSHANAH DAPPER [id number] [birth year] 04/19/2012      Progress Note New Patient  Subjective  Chief Complaint  Chief Complaint  Patient presents with  . Annual Exam    physical    HPI  56 year old Caucasian female who is in today for an annual exam. She offers several concerns. One is that she's having some irritation and discharge in her vaginal region again. She notes when she stopped hormone the mucosa seemed distended she's had more breakdown and some scant tannish discharge. Dyspareunia is also noted. She stopped hormones in March of 2013. Is complaining of a persistent cough. She does have a sense of loss of allergies and nasal congestion. Claritin helps but all other medications including Singulair Zyrtec cause throat she is hesitant to try [facility]. She recently had a recurrence of her onychomycosis and course of Lamisil in an honors all but unfortunately she had diarrhea with this. She reports they've been checking her liver functions and they have been normal. She denies chest pain, palpitations, shortness of breath, GU complaints at this time. She does complain of some feeling gaseous and occasionally feeling constipated having a small bump noted  periumbilically he which resolves. Nontender   Past Medical History  Diagnosis Date  . Chicken pox as a child  . Mumps as a child  . Measles as a child  . Arthritis     left hip, low back  . Strain of knee o7/06/12    left  . Allergy     seasonal  . Hypertension   . 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 Goiter   . Pharyngitis, acute 03/09/2011  . Hypokalemia 03/10/2011  . Neutropenia 03/10/2011  . Urinary frequency 04/27/2011  . Vaginitis 04/27/2011  . Cough 04/19/2012  . Atrophic vaginitis 01/25/2011    Past Surgical History  Procedure Date  . Cholecystectomy 1998  . Tonsillectomy as a child  . Laser surgery on vericose veins     b/l  . In grown  toenails removed     partial, b/l  . Hernia repair     right, inguinal  . Abdominal hysterectomy 1988    partial still has ovaries, uterine prolapse  . Biopsy thyroid     FNA, benign  . Breast biopsy     FNA, benign    Family History  Problem Relation Age of Onset  . Atrial fibrillation Mother   . Obesity Mother   . Diabetes Mother 2    Type 2  . Arthritis Mother     in right knee  . Heart disease Mother     atrial fibrillation, CHF  . Hernia Mother     never recovered from repair  . Hypertension Father   . Ulcers Father   . Diabetes Maternal Grandmother     type 2?  . Heart attack Maternal Grandmother   . Hearing loss Maternal Grandmother     MI  . Parkinsonism Maternal Grandfather   . Stroke Paternal Grandmother     History   Social History  . Marital Status: Married    Spouse Name: N/A    Number of Children: N/A  . Years of Education: N/A   Occupational History  . Not on file.   Social History Main Topics  . Smoking status: Former Smoker -- 1.0 packs/day    Types: Cigarettes    Quit date: 07/12/1976  . Smokeless tobacco: Never  Used  . Alcohol Use: Yes     occasionally  . Drug Use: No  . Sexually Active: Yes -- Female partner(s)   Other Topics Concern  . Not on file   Social History Narrative  . No narrative on file    Current Outpatient Prescriptions on File Prior to Visit  Medication Sig Dispense Refill  . Biotin 5000 MCG CAPS Take 1 capsule by mouth daily.        . chlorthalidone (HYGROTON) 25 MG tablet Take 1 tablet (25 mg total) by mouth daily.  90 tablet  3  . cloNIDine (CATAPRES) 0.1 MG tablet Take 1 tablet (0.1 mg total) by mouth at bedtime.  90 tablet  3  . loratadine (CLARITIN) 10 MG tablet Take 10 mg by mouth daily.      . Multiple Vitamin (MULTIVITAMIN) tablet Take 1 tablet by mouth daily.        . sodium chloride (AYR) 0.65 % nasal spray Place 2 sprays into the nose as needed for congestion (bid and prn).  30 mL  12    Allergies    Allergen Reactions  . Lisinopril Cough    Review of Systems  Review of Systems  Constitutional: Negative for fever, chills and malaise/fatigue.  HENT: Positive for congestion. Negative for hearing loss and nosebleeds.   Eyes: Negative for discharge.  Respiratory: Positive for cough. Negative for sputum production, shortness of breath and wheezing.   Cardiovascular: Negative for chest pain, palpitations and leg swelling.  Gastrointestinal: Negative for heartburn, nausea, vomiting, abdominal pain, diarrhea, constipation and blood in stool.       Patient reports feeling gassy, sometimes feels a knot in her periumbilical region but it comes and goes and is not tender  Genitourinary: Negative for dysuria, urgency, frequency and hematuria.  Musculoskeletal: Negative for myalgias, back pain and falls.  Skin: Negative for rash.  Neurological: Negative for dizziness, tremors, sensory change, focal weakness, loss of consciousness, weakness and headaches.  Endo/Heme/Allergies: Negative for polydipsia. Does not bruise/bleed easily.  Psychiatric/Behavioral: Negative for depression and suicidal ideas. The patient is not nervous/anxious and does not have insomnia.     Objective  BP 133/83  Pulse 98  Temp 98.6 F (37 C) (Temporal)  Ht 5' 7.5" (1.715 m)  Wt 182 lb 1.9 oz (82.609 kg)  BMI 28.10 kg/m2  SpO2 98%  Physical Exam  Physical Exam  Constitutional: She is oriented to person, place, and time and well-developed, well-nourished, and in no distress. No distress.  HENT:  Head: Normocephalic and atraumatic.  Eyes: Conjunctivae normal are normal.  Neck: Neck supple. No thyromegaly present.  Cardiovascular: Normal rate, regular rhythm and normal heart sounds.   No murmur heard. Pulmonary/Chest: Effort normal and breath sounds normal. She has no wheezes.  Abdominal: She exhibits no distension and no mass.  Musculoskeletal: She exhibits no edema.  Lymphadenopathy:    She has no cervical  adenopathy.  Neurological: She is alert and oriented to person, place, and time.  Skin: Skin is warm and dry. No rash noted. She is not diaphoretic.  Psychiatric: Memory, affect and judgment normal.       Assessment & Plan  Cough Persistent and worse in the morning but can occur all day and present for over 6 months, will proceed to CXR and patient offered referral to ENT for further consideration but she declines at this time. Will treat allergies somewhat more aggressively for now  Allergic state May try temporarily increasing the Claritin to bid  and adding nasal saline spray bid  Hypertension Adequately controlled at this time, no changes  Neutropenia Normalized with last blood draw will repeat cbc today  Atrophic vaginitis Patient started on Prempro cream vaginally every other day and then titrate down to lowest dose that is helpful. Pap at next visit  Vaginitis Encouraged a daily probiotic  Diverticulosis Asymptomatic except for some gaseousness. Start a probiotic daily  Preventative health care Doing well, had flu shot 04/11/12. Had Tdap at work in last 10 years. MGM 3/13. Encouraged heart healthy diet and exercise, check annual labs

## 2012-04-20 ENCOUNTER — Other Ambulatory Visit: Payer: Self-pay | Admitting: Family Medicine

## 2013-05-01 ENCOUNTER — Ambulatory Visit: Payer: Self-pay | Admitting: Family Medicine

## 2013-05-14 ENCOUNTER — Other Ambulatory Visit: Payer: Self-pay | Admitting: Family Medicine

## 2013-05-14 ENCOUNTER — Ambulatory Visit (INDEPENDENT_AMBULATORY_CARE_PROVIDER_SITE_OTHER): Payer: 59 | Admitting: Family Medicine

## 2013-05-14 ENCOUNTER — Encounter: Payer: Self-pay | Admitting: Family Medicine

## 2013-05-14 VITALS — BP 132/88 | HR 82 | Temp 97.8°F | Ht 67.5 in | Wt 168.0 lb

## 2013-05-14 DIAGNOSIS — M1990 Unspecified osteoarthritis, unspecified site: Secondary | ICD-10-CM

## 2013-05-14 DIAGNOSIS — R32 Unspecified urinary incontinence: Secondary | ICD-10-CM

## 2013-05-14 DIAGNOSIS — K5790 Diverticulosis of intestine, part unspecified, without perforation or abscess without bleeding: Secondary | ICD-10-CM

## 2013-05-14 DIAGNOSIS — N8111 Cystocele, midline: Secondary | ICD-10-CM

## 2013-05-14 DIAGNOSIS — K5730 Diverticulosis of large intestine without perforation or abscess without bleeding: Secondary | ICD-10-CM

## 2013-05-14 DIAGNOSIS — Z23 Encounter for immunization: Secondary | ICD-10-CM

## 2013-05-14 DIAGNOSIS — M129 Arthropathy, unspecified: Secondary | ICD-10-CM

## 2013-05-14 DIAGNOSIS — Z0000 Encounter for general adult medical examination without abnormal findings: Secondary | ICD-10-CM

## 2013-05-14 DIAGNOSIS — I10 Essential (primary) hypertension: Secondary | ICD-10-CM

## 2013-05-14 DIAGNOSIS — N8110 Cystocele, unspecified: Secondary | ICD-10-CM

## 2013-05-14 LAB — CBC
Hemoglobin: 13.5 g/dL (ref 12.0–15.0)
MCHC: 35.6 g/dL (ref 30.0–36.0)
MCV: 84.2 fL (ref 78.0–100.0)
Platelets: 238 10*3/uL (ref 150–400)
RDW: 13.5 % (ref 11.5–15.5)

## 2013-05-14 LAB — LIPID PANEL
Cholesterol: 176 mg/dL (ref 0–200)
HDL: 47 mg/dL (ref >39)
Triglycerides: 171 mg/dL — ABNORMAL HIGH (ref <150)
VLDL: 34 mg/dL (ref 0–40)

## 2013-05-14 LAB — HEPATIC FUNCTION PANEL
ALT: 30 U/L (ref 0–35)
Bilirubin, Direct: 0.1 mg/dL (ref 0.0–0.3)
Indirect Bilirubin: 0.2 mg/dL (ref 0.0–0.9)
Total Bilirubin: 0.3 mg/dL (ref 0.3–1.2)

## 2013-05-14 LAB — RENAL FUNCTION PANEL
Calcium: 9.9 mg/dL (ref 8.4–10.5)
Phosphorus: 4.1 mg/dL (ref 2.3–4.6)
Potassium: 4.1 mEq/L (ref 3.5–5.3)
Sodium: 140 mEq/L (ref 135–145)

## 2013-05-14 LAB — TSH: TSH: 1.218 u[IU]/mL (ref 0.350–4.500)

## 2013-05-14 MED ORDER — CHLORTHALIDONE 25 MG PO TABS: 12.5000 mg | ORAL_TABLET | Freq: Every day | ORAL | Status: DC

## 2013-05-14 MED ORDER — MELOXICAM 7.5 MG PO TABS: 7.5000 mg | ORAL_TABLET | Freq: Two times a day (BID) | ORAL | Status: DC | PRN

## 2013-05-14 NOTE — Patient Instructions (Signed)
Prolapse  Prolapse means the falling down, bulging, dropping, or drooping of a body part. Organs that commonly prolapse include the rectum, small intestine, bladder, urethra, vagina (birth canal), uterus (womb), and cervix. Prolapse occurs when the ligaments and muscle tissue around the rectum, bladder, and uterus are damaged or weakened.  CAUSES  This happens especially with:  Childbirth. Some women feel pelvic pressure or have trouble holding their urine right after childbirth, because of stretching and tearing of pelvic tissues. This generally gets better with time and the feeling usually goes away, but it may return with aging.  Chronic heavy lifting.  Aging.  Menopause, with loss of estrogen production weakening the pelvic ligaments and muscles.  Past pelvic surgery.  Obesity.  Chronic constipation.  Chronic cough. Prolapse may affect a single organ, or several organs may prolapse at the same time. The front wall of the vagina holds up the bladder. The back wall holds up part of the lower intestine, or rectum. The uterus fills a spot in the middle. All these organs can be involved when the ligaments and muscles around the vagina relax too much. This often gets worse when women stop producing estrogen (menopause). SYMPTOMS  Uncontrolled loss of urine (incontinence) with cough, sneeze, straining, and exercise.  More force may be required to have a bowel movement, due to trapping of the stool.  When part of an organ bulges through the opening of the vagina, there is sometimes a feeling of heaviness or pressure. It may feel as though something is falling out. This sensation increases with coughing or bearing down.  If the organs protrude through the opening of the vagina and rub against the clothing, there may be soreness, ulcers, infection, pain, and bleeding.  Lower back pain.  Pushing in the upper or lower part of the vagina, to pass urine or have a bowel movement.  Problems  having sexual intercourse.  Being unable to insert a tampon or applicator. DIAGNOSIS  Usually, a physical exam is all that is needed to identify the problem. During the examination, you may be asked to cough and strain while lying down, sitting up, and standing up. Your caregiver will determine if more testing is required, such as bladder function tests. Some diagnoses are:  Cystocele: Bulging and falling of the bladder into the top of the vagina.  Rectocele: Part of the rectum bulging into the vagina.  Prolapse of the uterus: The uterus falls or drops into the vagina.  Enterocele: Bulging of the top of the vagina, after a hysterectomy (uterus removal), with the small intestine bulging into the vagina. A hernia in the top of the vagina.  Urethrocele: The urethra (urine carrying tube) bulging into the vagina. TREATMENT  In most cases, prolapse needs to be treated only if it produces symptoms. If the symptoms are interfering with your usual daily or sexual activities, treatment may be necessary. The following are some measures that may be used to treat prolapse.  Estrogen may help elderly women with mild prolapse.  Kegel exercises may help mild cases of prolapse, by strengthening and tightening the muscles of the pelvic floor.  Pessaries are used in women who choose not to, or are unable to, have surgery. A pessary is a doughnut-shaped piece of plastic or rubber that is put into the vagina to keep the organs in place. This device must be fitted by your caregiver. Your caregiver will also explain how to care for yourself with the pessary. If it works well for you,   this may be the only treatment required.  Surgery is often the only form of treatment for more severe prolapses. There are different types of surgery available. You should discuss what the best procedure is for you. If the uterus is prolapsed, it may be removed (hysterectomy) as part of the surgical treatment. Your caregiver will  discuss the risks and benefits with you.  Uterine-vaginal suspension (surgery to hold up the organs) may be used, especially if you want to maintain your fertility. No form of treatment is guaranteed to correct the prolapse or relieve the symptoms. HOME CARE INSTRUCTIONS   Wear a sanitary pad or absorbent product if you have incontinence of urine.  Avoid heavy lifting and straining with exercise and work.  Take over-the-counter pain medicine for minor discomfort.  Try taking estrogen or using estrogen vaginal cream.  Try Kegel exercises or use a pessary, before deciding to have surgery.  Do Kegel exercises after having a baby. SEEK MEDICAL CARE IF:   Your symptoms interfere with your daily activities.  You need medicine to help with the discomfort.  You need to be fitted with a pessary.  You notice bleeding from the vagina.  You think you have ulcers or you notice ulcers on the cervix.  You have an oral temperature above 102 F (38.9 C).  You develop pain or blood with urination.  You have bleeding with a bowel movement.  The symptoms are interfering with your sex life.  You have urinary incontinence that interferes with your daily activities.  You lose urine with sexual intercourse.  You have a chronic cough.  You have chronic constipation. Document Released: 01/02/2003 Document Revised: 09/20/2011 Document Reviewed: 07/13/2009 [employer] Patient Information 2014 [employer].  

## 2013-05-15 LAB — URINALYSIS
Bilirubin Urine: NEGATIVE
Nitrite: NEGATIVE
Protein, ur: NEGATIVE mg/dL
Specific Gravity, Urine: <1.005 — ABNORMAL LOW (ref 1.005–1.030)
Urobilinogen, UA: 0.2 mg/dL (ref 0.0–1.0)

## 2013-05-15 LAB — CYTOLOGY, FLUID (SOLSTAS)

## 2013-05-15 LAB — URINE CULTURE
Colony Count: NO GROWTH
Organism ID, Bacteria: NO GROWTH

## 2013-05-16 ENCOUNTER — Encounter: Payer: Self-pay | Admitting: Family Medicine

## 2013-05-16 ENCOUNTER — Telehealth: Payer: Self-pay | Admitting: *Deleted

## 2013-05-16 ENCOUNTER — Telehealth: Payer: Self-pay

## 2013-05-16 DIAGNOSIS — N8110 Cystocele, unspecified: Secondary | ICD-10-CM

## 2013-05-16 HISTORY — DX: Cystocele, unspecified: N81.10

## 2013-05-16 MED ORDER — NITROFURANTOIN MONOHYD MACRO 100 MG PO CAPS: 100.0000 mg | ORAL_CAPSULE | Freq: Two times a day (BID) | ORAL | Status: DC

## 2013-05-16 NOTE — Assessment & Plan Note (Signed)
Patient has had a hysterectomy years ago and is now having bladder prolapse episodes. It always reduces but then recurs and she can feel it at the introitus, is uncomfortable at tiems. Has some urinary frequency, urgency and some occasional vagina irritation. Will refer for consideration of surgical correction

## 2013-05-16 NOTE — Progress Notes (Signed)
Patient ID: Ann Barton, female   DOB: 01-24-1956, 57 y.o.   MRN: [id number] Ann Barton [id number] 11/20/1955 05/16/2013      Progress Note-Follow Up  Subjective  Chief Complaint  Chief Complaint  Patient presents with  . bladder prolapse    pt states she can feel her bladder coming out  . Hand Pain    left hand? arthritis or nerve  . Injections    prevnar and tdap    HPI  Patient is a 57 year old Caucasian female who is in today for annual exam. Her biggest complaint is bladder prolapse. She feels her bladder when she wipes greatly and her pressure down in her pelvis for. She has trouble with urinary frequency and urgency. Has some hesitancy at times. Denies dysuria or hematuria but does note some vaginal irritation at times also complaining of persistent trouble with swelling and discomfort in her knees worse with walking prolonged distances. He is complaining finally of some pain in her left hand. Most notably in her left thumb and worse with use. She is left-handed. No redness or warmth. Constipation continues to come and go although it has improved somewhat.  Past Medical History  Diagnosis Date  . Chicken pox as a child  . Mumps as a child  . Measles as a child  . Arthritis     left hip, low back  . Strain of knee o7/06/12    left  . Allergy     seasonal  . Hypertension   . 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 Goiter   . Pharyngitis, acute 03/09/2011  . Hypokalemia 03/10/2011  . Neutropenia 03/10/2011  . Urinary frequency 04/27/2011  . Vaginitis 04/27/2011  . Cough 04/19/2012  . Atrophic vaginitis 01/25/2011  . Bladder prolapse, female, acquired 05/16/2013    Past Surgical History  Procedure Laterality Date  . Cholecystectomy  1998  . Tonsillectomy  as a child  . Laser surgery on vericose veins      b/l  . In grown toenails removed      partial, b/l  . Hernia repair      right, inguinal  . Abdominal hysterectomy  1988    partial still has ovaries, uterine  prolapse  . Biopsy thyroid      FNA, benign  . Breast biopsy      FNA, benign    Family History  Problem Relation Age of Onset  . Atrial fibrillation Mother   . Obesity Mother   . Diabetes Mother 3    Type 2  . Arthritis Mother     in right knee  . Heart disease Mother     atrial fibrillation, CHF  . Hernia Mother     never recovered from repair  . Hypertension Father   . Ulcers Father   . Pulmonary fibrosis Father   . Diabetes Maternal Grandmother     type 2?  . Heart attack Maternal Grandmother   . Hearing loss Maternal Grandmother     MI  . Parkinsonism Maternal Grandfather   . Stroke Paternal Grandmother     History   Social History  . Marital Status: Married    Spouse Name: N/A    Number of Children: N/A  . Years of Education: N/A   Occupational History  . Not on file.   Social History Main Topics  . Smoking status: Former Smoker -- 1.00 packs/day    Types: Cigarettes    Quit date: 07/12/1976  . Smokeless tobacco: Never  Used  . Alcohol Use: Yes     Comment: occasionally  . Drug Use: No  . Sexual Activity: Yes    Partners: Male   Other Topics Concern  . Not on file   Social History Narrative  . No narrative on file    Current Outpatient Prescriptions on File Prior to Visit  Medication Sig Dispense Refill  . Biotin 5000 MCG CAPS Take 0.5 capsules by mouth daily.       . Cholecalciferol (VITAMIN D3) 5000 UNITS CAPS Take 0.5 capsules by mouth daily.       . Multiple Vitamin (MULTIVITAMIN) tablet Take 1 tablet by mouth daily.        . sodium chloride (AYR) 0.65 % nasal spray Place 2 sprays into the nose as needed for congestion (bid and prn).  30 mL  12   No current facility-administered medications on file prior to visit.    Allergies  Allergen Reactions  . Lisinopril Cough    Review of Systems  Review of Systems  Constitutional: Negative for fever, chills and malaise/fatigue.  HENT: Negative for congestion, hearing loss and nosebleeds.    Eyes: Negative for discharge.  Respiratory: Negative for cough, sputum production, shortness of breath and wheezing.   Cardiovascular: Negative for chest pain, palpitations and leg swelling.  Gastrointestinal: Positive for constipation. Negative for heartburn, nausea, vomiting, abdominal pain, diarrhea and blood in stool.  Genitourinary: Positive for urgency and frequency. Negative for dysuria and hematuria.  Musculoskeletal: Positive for joint pain. Negative for back pain, falls and myalgias.       Left thumb pain, b/l knee, red and swollen at times.   Skin: Negative for rash.  Neurological: Negative for dizziness, tremors, sensory change, focal weakness, loss of consciousness, weakness and headaches.  Endo/Heme/Allergies: Negative for polydipsia. Does not bruise/bleed easily.  Psychiatric/Behavioral: Negative for depression and suicidal ideas. The patient is not nervous/anxious and does not have insomnia.     Objective  BP 132/88  Pulse 82  Temp(Src) 97.8 F (36.6 C) (Oral)  Ht 5' 7.5" (1.715 m)  Wt 168 lb 0.6 oz (76.222 kg)  BMI 25.92 kg/m2  SpO2 97%  Physical Exam  Physical Exam  Constitutional: She is oriented to person, place, and time and well-developed, well-nourished, and in no distress. No distress.  HENT:  Head: Normocephalic and atraumatic.  Eyes: Conjunctivae are normal.  Neck: Neck supple. No thyromegaly present.  Cardiovascular: Normal rate, regular rhythm and normal heart sounds.   No murmur heard. Pulmonary/Chest: Effort normal and breath sounds normal. She has no wheezes.  Abdominal: She exhibits no distension and no mass.  Musculoskeletal: She exhibits no edema.  Lymphadenopathy:    She has no cervical adenopathy.  Neurological: She is alert and oriented to person, place, and time.  Skin: Skin is warm and dry. No rash noted. She is not diaphoretic.  Psychiatric: Memory, affect and judgment normal.    Lab Results  Component Value Date   TSH 1.218  05/14/2013   Lab Results  Component Value Date   WBC 6.5 05/14/2013   HGB 13.5 05/14/2013   HCT 37.9 05/14/2013   MCV 84.2 05/14/2013   PLT 238 05/14/2013   Lab Results  Component Value Date   CREATININE 0.76 05/14/2013   BUN 16 05/14/2013   NA 140 05/14/2013   K 4.1 05/14/2013   CL 100 05/14/2013   CO2 32 05/14/2013   Lab Results  Component Value Date   ALT 30 05/14/2013   AST  27 05/14/2013   ALKPHOS 54 05/14/2013   BILITOT 0.3 05/14/2013   Lab Results  Component Value Date   CHOL 176 05/14/2013   Lab Results  Component Value Date   HDL 47 05/14/2013   Lab Results  Component Value Date   LDLCALC 95 05/14/2013   Lab Results  Component Value Date   TRIG 171* 05/14/2013   Lab Results  Component Value Date   CHOLHDL 3.7 05/14/2013   Assessment & Plan  Hypertension Well controlled, no changes  Arthritis Increased pain in left thumb recently, no injury, pain is sharp and comes and goes. Has been worsening over last year. Encouraged Aspercreme and krill oil caps. Consider referral to ortho if no improvement. Consider tendonitis making this worse. Is fitted for a splint  Bladder prolapse, female, acquired Patient has had a hysterectomy years ago and is now having bladder prolapse episodes. It always reduces but then recurs and she can feel it at the introitus, is uncomfortable at tiems. Has some urinary frequency, urgency and some occasional vagina irritation. Will refer for consideration of surgical correction  Diverticulosis Encouraged probiotics and fiber.  Preventative health care Denies any trouble with ADLs, doing well. Given Tdap and Pneumonia vaccination today. Encouraged heart healthy diet and regular exercise. Reviewed annual labs

## 2013-05-16 NOTE — Assessment & Plan Note (Addendum)
Denies any trouble with ADLs, doing well. Given Tdap and Pneumonia vaccination today. Encouraged heart healthy diet and regular exercise. Reviewed annual labs

## 2013-05-16 NOTE — Telephone Encounter (Signed)
Error

## 2013-05-16 NOTE — Telephone Encounter (Signed)
Macrobid rx sent into pharmacy.

## 2013-05-16 NOTE — Assessment & Plan Note (Signed)
Well controlled, no changes 

## 2013-05-16 NOTE — Assessment & Plan Note (Addendum)
Increased pain in left thumb recently, no injury, pain is sharp and comes and goes. Has been worsening over last year. Encouraged Aspercreme and krill oil caps. Consider referral to ortho if no improvement. Consider tendonitis making this worse. Is fitted for a splint

## 2013-05-16 NOTE — Assessment & Plan Note (Signed)
Encouraged probiotics and fiber.

## 2013-05-17 ENCOUNTER — Other Ambulatory Visit: Payer: Self-pay

## 2013-05-21 ENCOUNTER — Telehealth: Payer: Self-pay

## 2013-05-21 NOTE — Telephone Encounter (Signed)
Left voice mail for patient informing her of the results. And instructed patient to call back if she had any questions or concerns.

## 2013-05-21 NOTE — Telephone Encounter (Signed)
Message copied by Eulis Manly on Mon May 21, 2013  2:53 PM ------      Message from: Danise Edge A      Created: Tue May 15, 2013  7:02 PM       Notify neg ------

## 2013-07-12 HISTORY — PX: NASAL SEPTUM SURGERY: SHX37

## 2013-08-23 ENCOUNTER — Encounter: Payer: Self-pay | Admitting: Family Medicine

## 2013-08-23 ENCOUNTER — Ambulatory Visit (INDEPENDENT_AMBULATORY_CARE_PROVIDER_SITE_OTHER): Payer: 59 | Admitting: Family Medicine

## 2013-08-23 ENCOUNTER — Telehealth: Payer: Self-pay | Admitting: *Deleted

## 2013-08-23 VITALS — BP 142/100 | HR 92 | Temp 97.5°F | Ht 67.5 in | Wt 171.1 lb

## 2013-08-23 DIAGNOSIS — N8110 Cystocele, unspecified: Secondary | ICD-10-CM

## 2013-08-23 DIAGNOSIS — T7840XA Allergy, unspecified, initial encounter: Secondary | ICD-10-CM

## 2013-08-23 DIAGNOSIS — E785 Hyperlipidemia, unspecified: Secondary | ICD-10-CM

## 2013-08-23 DIAGNOSIS — R5381 Other malaise: Secondary | ICD-10-CM

## 2013-08-23 DIAGNOSIS — Z79899 Other long term (current) drug therapy: Secondary | ICD-10-CM

## 2013-08-23 DIAGNOSIS — N760 Acute vaginitis: Secondary | ICD-10-CM

## 2013-08-23 DIAGNOSIS — Z0000 Encounter for general adult medical examination without abnormal findings: Secondary | ICD-10-CM

## 2013-08-23 DIAGNOSIS — I10 Essential (primary) hypertension: Secondary | ICD-10-CM

## 2013-08-23 DIAGNOSIS — M129 Arthropathy, unspecified: Secondary | ICD-10-CM

## 2013-08-23 DIAGNOSIS — R5383 Other fatigue: Secondary | ICD-10-CM

## 2013-08-23 DIAGNOSIS — R0681 Apnea, not elsewhere classified: Secondary | ICD-10-CM

## 2013-08-23 DIAGNOSIS — E7800 Pure hypercholesterolemia, unspecified: Secondary | ICD-10-CM | POA: Insufficient documentation

## 2013-08-23 DIAGNOSIS — M1990 Unspecified osteoarthritis, unspecified site: Secondary | ICD-10-CM

## 2013-08-23 HISTORY — DX: Hyperlipidemia, unspecified: E78.5

## 2013-08-23 LAB — BASIC METABOLIC PANEL
BUN: 20 mg/dL (ref 6–23)
CALCIUM: 9.3 mg/dL (ref 8.4–10.5)
CO2: 31 mEq/L (ref 19–32)
CREATININE: 0.71 mg/dL (ref 0.50–1.10)
Chloride: 104 mEq/L (ref 96–112)
GLUCOSE: 85 mg/dL (ref 70–99)
Potassium: 3.9 mEq/L (ref 3.5–5.3)
Sodium: 142 mEq/L (ref 135–145)

## 2013-08-23 LAB — CBC WITH DIFFERENTIAL/PLATELET
BASOS PCT: 0 % (ref 0–1)
Basophils Absolute: 0 10*3/uL (ref 0.0–0.1)
Eosinophils Absolute: 0.3 10*3/uL (ref 0.0–0.7)
Eosinophils Relative: 6 % — ABNORMAL HIGH (ref 0–5)
HCT: 37.4 % (ref 36.0–46.0)
Hemoglobin: 12.6 g/dL (ref 12.0–15.0)
LYMPHS ABS: 1.5 10*3/uL (ref 0.7–4.0)
Lymphocytes Relative: 30 % (ref 12–46)
MCH: 29.9 pg (ref 26.0–34.0)
MCHC: 33.7 g/dL (ref 30.0–36.0)
MCV: 88.6 fL (ref 78.0–100.0)
Monocytes Absolute: 0.3 10*3/uL (ref 0.1–1.0)
Monocytes Relative: 6 % (ref 3–12)
NEUTROS PCT: 58 % (ref 43–77)
Neutro Abs: 2.8 10*3/uL (ref 1.7–7.7)
PLATELETS: 259 10*3/uL (ref 150–400)
RBC: 4.22 MIL/uL (ref 3.87–5.11)
RDW: 13.2 % (ref 11.5–15.5)
WBC: 4.8 10*3/uL (ref 4.0–10.5)

## 2013-08-23 LAB — LIPID PANEL
Cholesterol: 146 mg/dL (ref 0–200)
HDL: 52 mg/dL (ref >39)
LDL Cholesterol: 74 mg/dL (ref 0–99)
TRIGLYCERIDES: 99 mg/dL (ref <150)
Total CHOL/HDL Ratio: 2.8 Ratio
VLDL: 20 mg/dL (ref 0–40)

## 2013-08-23 MED ORDER — MELOXICAM 7.5 MG PO TABS: 7.5000 mg | ORAL_TABLET | Freq: Two times a day (BID) | ORAL | Status: DC | PRN

## 2013-08-23 MED ORDER — CHLORTHALIDONE 25 MG PO TABS: 25.0000 mg | ORAL_TABLET | Freq: Every day | ORAL | Status: DC

## 2013-08-23 NOTE — Progress Notes (Signed)
Subjective:     Patient ID: Ann Barton, female   DOB: 26-Sep-1955, 58 y.o.   MRN: [id number]  CC: Pt is here for annual physical exam.   HPI  1. Pt reports Chronic nasal congestion, post nasal drip, productive cough in mornings, ears frequently feel stuffed x a many years. Flonase/Nasonex are bothersome to her throat. She has tried  Human resources officer alternated with Claritin daily in morning in past has not been helpful. ENT in [place], 2010, for only 1-2 visits.  CT scan of sinuses was unremarkable.   2. Pt expressed concerns about her sleep, she finds herself snoring and waking herself up. Her husband notices she breathes abnormally at night in recent weeks. Never had a sleep study before. Fatigue up to 1 year. Wakes up with headaches in mornings 2-3 times per week. Ibuprofen or caffeine can relieve headache.   3. Urology referral 06/2013 - for bladder prolapse. Was recommended Urodynamics but couldn't afford it. Sees gyn next week.   Review of Systems  Constitutional: Positive for fatigue.  HENT: Positive for congestion, postnasal drip and sinus pressure.   Respiratory: Negative for cough, chest tightness and shortness of breath.   Cardiovascular: Negative for chest pain and palpitations.  Gastrointestinal: Negative for abdominal pain, diarrhea and constipation.  Neurological: Positive for headaches.   Current Outpatient Prescriptions on File Prior to Visit  Medication Sig Dispense Refill  . Biotin 5000 MCG CAPS Take 1 capsule by mouth daily.       . Cholecalciferol (VITAMIN D3) 5000 UNITS CAPS Take 1 capsule by mouth daily.       . Coenzyme Q10 (COQ-10) 200 MG CAPS Take 1 tablet by mouth daily.       . diphenhydrAMINE (BENADRYL) 25 mg capsule Take 25 mg by mouth as needed for itching or sleep.      Marland Kitchen ibuprofen (ADVIL,MOTRIN) 200 MG tablet Take 200 mg by mouth every 6 (six) hours as needed for pain.      . meloxicam (MOBIC) 7.5 MG tablet Take 1 tablet (7.5 mg total) by mouth 2 (two) times  daily as needed for pain.  60 tablet  2  . Multiple Vitamin (MULTIVITAMIN) tablet Take 1 tablet by mouth daily.        . nitrofurantoin, macrocrystal-monohydrate, (MACROBID) 100 MG capsule Take 1 capsule (100 mg total) by mouth 2 (two) times daily.  9 capsule  0  . Probiotic Product (PROBIOTIC DAILY PO) Take 1 tablet by mouth daily.       . sodium chloride (AYR) 0.65 % nasal spray Place 2 sprays into the nose as needed for congestion (bid and prn).  30 mL  12   No current facility-administered medications on file prior to visit.   Past Medical History  Diagnosis Date  . Chicken pox as a child  . Mumps as a child  . Measles as a child  . Arthritis     left hip, low back  . Strain of knee o7/06/12    left  . Allergy     seasonal  . Hypertension   . 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 Goiter   . Pharyngitis, acute 03/09/2011  . Hypokalemia 03/10/2011  . Neutropenia 03/10/2011  . Urinary frequency 04/27/2011  . Vaginitis 04/27/2011  . Cough 04/19/2012  . Atrophic vaginitis 01/25/2011  . Bladder prolapse, female, acquired 05/16/2013  . Other and unspecified hyperlipidemia 08/23/2013        Objective:   Physical Exam  Vitals reviewed. Constitutional: She  is oriented to person, place, and time. She appears well-developed and well-nourished. No distress.  HENT:  Head: Normocephalic and atraumatic.  Pulmonary/Chest: Effort normal. No respiratory distress.  Neurological: She is alert and oriented to person, place, and time.  Skin: No rash noted.  Psychiatric: Her behavior is normal. Judgment normal.   Filed Vitals:   08/23/13 1449  BP: 142/100  Pulse: 92  Temp: 97.5 F (36.4 C)       Assessment:      1. HTN 2. Hyperlipidemia 3. Arthritis left hand 4. Bladder prolapse 5. Chronic sinusitis       Plan:      1. HTN- 2. Hyperlipidemia-  3. Arthritis left hand- continue mobic 4. Bladder prolapse  5. Chronic sinusitis - referral to ENT  Further Assessment & Plan  will be addressed by Dr. Charlett Blake.  02.12.15  Ann Lashawndra Lampkins, PA-S     Patient is seen, interviewed and examined with student, agree with documentation  Vaginitis Seeing GYN next week  Allergic state Chronic sinus pressure despite treatment. Will refer to allergist for further consideration.  Other malaise and fatigue Patient notes snoring, fatigue, restless sleep, elevated BP and am HA. Is set up for home sleep study to evaluate  Other and unspecified hyperlipidemia Avoid trans fats, increase exercise. Consider krill oil caps  Hypertension Improved control on recheck no changes  Bladder prolapse, female, acquired Has been seen by urology and had urodynamic studies.   Preventative health care Encouraged heart healthy diet, regular exercise, and regular exercise. Fasting labs reviewed

## 2013-08-23 NOTE — Patient Instructions (Signed)
DASH Diet  The DASH diet stands for "Dietary Approaches to Stop Hypertension." It is a healthy eating plan that has been shown to reduce high blood pressure (hypertension) in as little as 14 days, while also possibly providing other significant health benefits. These other health benefits include reducing the risk of breast cancer after menopause and reducing the risk of type 2 diabetes, heart disease, colon cancer, and stroke. Health benefits also include weight loss and slowing kidney failure in patients with chronic kidney disease.   DIET GUIDELINES  · Limit salt (sodium). Your diet should contain less than 1500 mg of sodium daily.  · Limit refined or processed carbohydrates. Your diet should include mostly whole grains. Desserts and added sugars should be used sparingly.  · Include small amounts of heart-healthy fats. These types of fats include nuts, oils, and tub margarine. Limit saturated and trans fats. These fats have been shown to be harmful in the body.  CHOOSING FOODS   The following food groups are based on a 2000 calorie diet. See your Registered Dietitian for individual calorie needs.  Grains and Grain Products (6 to 8 servings daily)  · Eat More Often: Whole-wheat bread, brown rice, whole-grain or wheat pasta, quinoa, popcorn without added fat or salt (air popped).  · Eat Less Often: White bread, white pasta, white rice, cornbread.  Vegetables (4 to 5 servings daily)  · Eat More Often: Fresh, frozen, and canned vegetables. Vegetables may be raw, steamed, roasted, or grilled with a minimal amount of fat.  · Eat Less Often/Avoid: Creamed or fried vegetables. Vegetables in a cheese sauce.  Fruit (4 to 5 servings daily)  · Eat More Often: All fresh, canned (in natural juice), or frozen fruits. Dried fruits without added sugar. One hundred percent fruit juice (½ cup [237 mL] daily).  · Eat Less Often: Dried fruits with added sugar. Canned fruit in light or heavy syrup.  Lean Meats, Fish, and Poultry (2  servings or less daily. One serving is 3 to 4 oz [85-114 g]).  · Eat More Often: Ninety percent or leaner ground beef, tenderloin, sirloin. Round cuts of beef, chicken breast, turkey breast. All fish. Grill, bake, or broil your meat. Nothing should be fried.  · Eat Less Often/Avoid: Fatty cuts of meat, turkey, or chicken leg, thigh, or wing. Fried cuts of meat or fish.  Dairy (2 to 3 servings)  · Eat More Often: Low-fat or fat-free milk, low-fat plain or light yogurt, reduced-fat or part-skim cheese.  · Eat Less Often/Avoid: Milk (whole, 2%). Whole milk yogurt. Full-fat cheeses.  Nuts, Seeds, and Legumes (4 to 5 servings per week)  · Eat More Often: All without added salt.  · Eat Less Often/Avoid: Salted nuts and seeds, canned beans with added salt.  Fats and Sweets (limited)  · Eat More Often: Vegetable oils, tub margarines without trans fats, sugar-free gelatin. Mayonnaise and salad dressings.  · Eat Less Often/Avoid: Coconut oils, palm oils, butter, stick margarine, cream, half and half, cookies, candy, pie.  FOR MORE INFORMATION  The Dash Diet Eating Plan: www.dashdiet.org  Document Released: 06/17/2011 Document Revised: 09/20/2011 Document Reviewed: 06/17/2011  Exit[employer]® Patient Information ©2014 [employer].

## 2013-08-23 NOTE — Telephone Encounter (Signed)
Orders to lab/SLS 

## 2013-08-23 NOTE — Progress Notes (Signed)
Pre visit review using our clinic review tool, if applicable. No additional management support is needed unless otherwise documented below in the visit note. 

## 2013-08-24 ENCOUNTER — Telehealth: Payer: Self-pay | Admitting: Family Medicine

## 2013-08-24 LAB — URINALYSIS, ROUTINE W REFLEX MICROSCOPIC

## 2013-08-24 LAB — TSH: TSH: 1.834 u[IU]/mL (ref 0.350–4.500)

## 2013-08-24 NOTE — Telephone Encounter (Signed)
Relevant patient education assigned to patient using Emmi. ° °

## 2013-08-26 ENCOUNTER — Encounter: Payer: Self-pay | Admitting: Family Medicine

## 2013-08-26 DIAGNOSIS — R5383 Other fatigue: Secondary | ICD-10-CM

## 2013-08-26 DIAGNOSIS — R5381 Other malaise: Secondary | ICD-10-CM

## 2013-08-26 HISTORY — DX: Other fatigue: R53.83

## 2013-08-26 HISTORY — DX: Other malaise: R53.81

## 2013-08-26 NOTE — Assessment & Plan Note (Signed)
Improved control on recheck no changes

## 2013-08-26 NOTE — Assessment & Plan Note (Signed)
Seeing GYN next week

## 2013-08-26 NOTE — Assessment & Plan Note (Signed)
Has been seen by urology and had urodynamic studies.

## 2013-08-26 NOTE — Assessment & Plan Note (Signed)
Avoid trans fats, increase exercise. Consider krill oil caps

## 2013-08-26 NOTE — Assessment & Plan Note (Signed)
Encouraged heart healthy diet, regular exercise, and regular exercise. Fasting labs reviewed

## 2013-08-26 NOTE — Assessment & Plan Note (Signed)
Patient notes snoring, fatigue, restless sleep, elevated BP and am HA. Is set up for home sleep study to evaluate

## 2013-08-26 NOTE — Assessment & Plan Note (Signed)
Chronic sinus pressure despite treatment. Will refer to allergist for further consideration.

## 2013-08-31 ENCOUNTER — Ambulatory Visit (INDEPENDENT_AMBULATORY_CARE_PROVIDER_SITE_OTHER): Payer: 59 | Admitting: Gynecology

## 2013-08-31 ENCOUNTER — Encounter: Payer: Self-pay | Admitting: Gynecology

## 2013-08-31 VITALS — BP 142/90 | HR 100 | Resp 18 | Ht 68.5 in | Wt 172.2 lb

## 2013-08-31 DIAGNOSIS — IMO0002 Reserved for concepts with insufficient information to code with codable children: Secondary | ICD-10-CM

## 2013-08-31 DIAGNOSIS — N898 Other specified noninflammatory disorders of vagina: Secondary | ICD-10-CM

## 2013-08-31 DIAGNOSIS — N760 Acute vaginitis: Secondary | ICD-10-CM

## 2013-08-31 DIAGNOSIS — N8111 Cystocele, midline: Secondary | ICD-10-CM

## 2013-08-31 DIAGNOSIS — Z01419 Encounter for gynecological examination (general) (routine) without abnormal findings: Secondary | ICD-10-CM

## 2013-08-31 DIAGNOSIS — N952 Postmenopausal atrophic vaginitis: Secondary | ICD-10-CM

## 2013-08-31 LAB — POCT WET PREP (WET MOUNT): Clue Cells Wet Prep Whiff POC: NEGATIVE

## 2013-08-31 MED ORDER — ESTRADIOL 10 MCG VA TABS: 1.0000 | ORAL_TABLET | VAGINAL | Status: DC

## 2013-08-31 NOTE — Progress Notes (Signed)
58 y.o. Married Caucasian female   G2P2000 here for annual exam. Pt reports menses Hysterectomy.  She does report hot flashes, does have night sweats, does have vaginal dryness.  She is not using lubricants.  She does not report post-menopasual bleeding.  Pt is a NICU nurse.  Pt reports issues with bladder prolapse, worsens through out day.  Pt reports incomplete emptying, she may have some hesitancy.  Pt does not need to reposition to void.  She does not have recurrent UTI or kidney infection.  Reports dyspareunia.  No incontinence with coitus.  She does not need to wear pad with exercise, she does report vaginal drainage for a few months-no odor or color, wears pantishield for this, no need to change.  No change in drainage.  Occaisionaly will need to splint to dececate but no compression in vagina.    No LMP recorded. Patient has had a hysterectomy.          Sexually active: yes  The current method of family planning is none.    Exercising: yes  treadmill 2 - 3 x weekly Last pap: about 04/2011 Abnormal PAP: none Mammogram: 08/2011 BSE: no Colonoscopy: 2007 - normal DEXA: none Alcohol: yes, wine x 3 - 4 weekly Tobacco: no  Health Maintenance  Topic Date Due  . Colonoscopy  05/17/2005  . Mammogram  10/05/2013  . Influenza Vaccine  02/09/2014  . Pap Smear  04/26/2014  . Tetanus/tdap  05/15/2023    Family History  Problem Relation Age of Onset  . Atrial fibrillation Mother   . Obesity Mother   . Diabetes Mother 34    Type 2  . Arthritis Mother     in right knee  . Heart disease Mother     atrial fibrillation, CHF  . Hernia Mother     never recovered from repair  . Hypertension Father   . Ulcers Father   . Pulmonary fibrosis Father   . Diabetes Maternal Grandmother     type 2?  . Heart attack Maternal Grandmother   . Hearing loss Maternal Grandmother     MI  . Parkinsonism Maternal Grandfather   . Stroke Paternal Grandmother     Patient Active Problem List   Diagnosis  Date Noted  . Other malaise and fatigue 08/26/2013  . Other and unspecified hyperlipidemia 08/23/2013  . Bladder prolapse, female, acquired 05/16/2013  . Cough 04/19/2012  . Urinary frequency 04/27/2011  . Vaginitis 04/27/2011  . Hypokalemia 03/10/2011  . Neutropenia 03/10/2011  . Arthritis 01/25/2011  . Atrophic vaginitis 01/25/2011  . Preventative health care 01/25/2011  . Diverticulosis 01/25/2011  . Strain of knee   . Allergic state   . Hypertension   . Chicken pox   . Mumps   . Measles   . Goiter     Past Medical History  Diagnosis Date  . Chicken pox as a child  . Mumps as a child  . Measles as a child  . Arthritis     left hip, low back  . Strain of knee o7/06/12    left  . Allergy     seasonal  . Hypertension   . 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 Goiter   . Pharyngitis, acute 03/09/2011  . Hypokalemia 03/10/2011  . Neutropenia 03/10/2011  . Urinary frequency 04/27/2011  . Vaginitis 04/27/2011  . Cough 04/19/2012  . Atrophic vaginitis 01/25/2011  . Bladder prolapse, female, acquired 05/16/2013  . Other and unspecified hyperlipidemia 08/23/2013  . Other malaise  and fatigue 08/26/2013    Past Surgical History  Procedure Laterality Date  . Cholecystectomy  1998  . Tonsillectomy  as a child  . Laser surgery on vericose veins      b/l  . In grown toenails removed      partial, b/l  . Hernia repair      right, inguinal  . Abdominal hysterectomy  1988    partial still has ovaries, uterine prolapse  . Biopsy thyroid      FNA, benign  . Breast biopsy      FNA, benign    Allergies: Lisinopril  Current Outpatient Prescriptions  Medication Sig Dispense Refill  . Biotin 5000 MCG CAPS Take 1 capsule by mouth daily.       . chlorthalidone (HYGROTON) 25 MG tablet Take 1 tablet (25 mg total) by mouth daily.  90 tablet  1  . Cholecalciferol (VITAMIN D3) 5000 UNITS CAPS Take 1 capsule by mouth daily.       . Coenzyme Q10 (COQ-10) 200 MG CAPS Take 1 tablet by  mouth daily.       . diphenhydrAMINE (BENADRYL) 25 mg capsule Take 25 mg by mouth as needed for itching or sleep.      Marland Kitchen ibuprofen (ADVIL,MOTRIN) 200 MG tablet Take 200 mg by mouth every 6 (six) hours as needed for pain.      . meloxicam (MOBIC) 7.5 MG tablet Take 1 tablet (7.5 mg total) by mouth 2 (two) times daily as needed for pain.  180 tablet  1  . Multiple Vitamin (MULTIVITAMIN) tablet Take 1 tablet by mouth daily.        . Probiotic Product (PROBIOTIC DAILY PO) Take 1 tablet by mouth daily.       . sodium chloride (AYR) 0.65 % nasal spray Place 2 sprays into the nose as needed for congestion (bid and prn).  30 mL  12   No current facility-administered medications for this visit.    ROS: Pertinent items are noted in HPI.  Exam:    BP 142/90  Pulse 100  Resp 18  Ht 5' 8.5" (1.74 m)  Wt 172 lb 3.2 oz (78.109 kg)  BMI 25.80 kg/m2 Weight change: @WEIGHTCHANGE @ Last 3 height recordings:  Ht Readings from Last 3 Encounters:  08/31/13 5' 8.5" (1.74 m)  08/23/13 5' 7.5" (1.715 m)  05/14/13 5' 7.5" (1.715 m)   General appearance: alert, cooperative and appears stated age Head: Normocephalic, without obvious abnormality, atraumatic Neck: no adenopathy, no carotid bruit, no JVD, supple, symmetrical, trachea midline and thyroid not enlarged, symmetric, no tenderness/mass/nodules Lungs: clear to auscultation bilaterally Breasts: normal appearance, no masses or tenderness Heart: regular rate and rhythm, S1, S2 normal, no murmur, click, rub or gallop Abdomen: soft, non-tender; bowel sounds normal; no masses,  no organomegaly Extremities: extremities normal, atraumatic, no cyanosis or edema Skin: Skin color, texture, turgor normal. No rashes or lesions Lymph nodes: Cervical, supraclavicular, and axillary nodes normal. no inguinal nodes palpated Neurologic: Grossly normal   Pelvic: External genitalia:  no lesions              Urethra: normal appearing urethra with no masses, tenderness  or lesions              Bartholins and Skenes: normal                 Vagina:well supported cuff, moderate rugae, erythematous with white discharge noted.  Mild cystocele on supine exam with valsalva, pt examined upright  and cystocele to hymen with valsalva, minimal rectocele              Cervix: absent              Pap taken: no        Bimanual Exam:  Uterus:  absent                                      Adnexa:    no masses                                      Rectovaginal: Confirms                                      Anus:  normal sphincter tone, no lesions  A: well woman referred for cystocele management Atrophic aginitis     P: pt is not interested in invasive treatment, prefers pessary placement.  She is sexually active.  #2 ring with support placed, pt tolerated well, comfortable, cystocele supported, #3 placed but slipped out Will treat vaginitis with vagifem, instructed on use  Will rto when pessary in, pt will be instructed re-removal.  mammogram return annually or prn Discussed PAP guideline changes, importance of weight bearing exercises, calcium, vit D and balanced diet.  An After Visit Summary was printed and given to the patient.

## 2013-08-31 NOTE — Progress Notes (Deleted)
58 y.o. @MARITALSTS  @Caucasian  female   No obstetric history on file. here for annual exam. Pt reports menses {Actions; are/are not:16769} regular.  She {DOES_DOES IEP:32951} report hot flashes, {DOES_DOES OAC:16606} have night sweats, {DOES_DOES TKZ:60109} have vaginal dryness.  She {ACTION; IS/IS NAT:55732202} using lubricants, ***.  She {DOES_DOES RKY:70623} report post-menopasual bleeding  No LMP recorded. Patient has had a hysterectomy.          Sexually active: {yes no:314532}  The current method of family planning is {contraception:315051}.    Exercising: {yes no:314532}  {types:19826} Last pap: Abnormal PAP: Mammogram: BSE: Colonoscopy: DEXA: Alcohol: Tobacco:  Health Maintenance  Topic Date Due  . Colonoscopy  05/17/2005  . Mammogram  10/05/2013  . Influenza Vaccine  02/09/2014  . Pap Smear  04/26/2014  . Tetanus/tdap  05/15/2023    Family History  Problem Relation Age of Onset  . Atrial fibrillation Mother   . Obesity Mother   . Diabetes Mother 103    Type 2  . Arthritis Mother     in right knee  . Heart disease Mother     atrial fibrillation, CHF  . Hernia Mother     never recovered from repair  . Hypertension Father   . Ulcers Father   . Pulmonary fibrosis Father   . Diabetes Maternal Grandmother     type 2?  . Heart attack Maternal Grandmother   . Hearing loss Maternal Grandmother     MI  . Parkinsonism Maternal Grandfather   . Stroke Paternal Grandmother     Patient Active Problem List   Diagnosis Date Noted  . Other malaise and fatigue 08/26/2013  . Other and unspecified hyperlipidemia 08/23/2013  . Bladder prolapse, female, acquired 05/16/2013  . Cough 04/19/2012  . Urinary frequency 04/27/2011  . Vaginitis 04/27/2011  . Hypokalemia 03/10/2011  . Neutropenia 03/10/2011  . Arthritis 01/25/2011  . Atrophic vaginitis 01/25/2011  . Preventative health care 01/25/2011  . Diverticulosis 01/25/2011  . Strain of knee   . Allergic state   .  Hypertension   . Chicken pox   . Mumps   . Measles   . Goiter     Past Medical History  Diagnosis Date  . Chicken pox as a child  . Mumps as a child  . Measles as a child  . Arthritis     left hip, low back  . Strain of knee o7/06/12    left  . Allergy     seasonal  . Hypertension   . 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 Goiter   . Pharyngitis, acute 03/09/2011  . Hypokalemia 03/10/2011  . Neutropenia 03/10/2011  . Urinary frequency 04/27/2011  . Vaginitis 04/27/2011  . Cough 04/19/2012  . Atrophic vaginitis 01/25/2011  . Bladder prolapse, female, acquired 05/16/2013  . Other and unspecified hyperlipidemia 08/23/2013  . Other malaise and fatigue 08/26/2013    Past Surgical History  Procedure Laterality Date  . Cholecystectomy  1998  . Tonsillectomy  as a child  . Laser surgery on vericose veins      b/l  . In grown toenails removed      partial, b/l  . Hernia repair      right, inguinal  . Abdominal hysterectomy  1988    partial still has ovaries, uterine prolapse  . Biopsy thyroid      FNA, benign  . Breast biopsy      FNA, benign    Allergies: Lisinopril  Current Outpatient Prescriptions  Medication Sig Dispense  Refill  . Biotin 5000 MCG CAPS Take 1 capsule by mouth daily.       . chlorthalidone (HYGROTON) 25 MG tablet Take 1 tablet (25 mg total) by mouth daily.  90 tablet  1  . Cholecalciferol (VITAMIN D3) 5000 UNITS CAPS Take 1 capsule by mouth daily.       . Coenzyme Q10 (COQ-10) 200 MG CAPS Take 1 tablet by mouth daily.       . diphenhydrAMINE (BENADRYL) 25 mg capsule Take 25 mg by mouth as needed for itching or sleep.      Marland Kitchen. ibuprofen (ADVIL,MOTRIN) 200 MG tablet Take 200 mg by mouth every 6 (six) hours as needed for pain.      . meloxicam (MOBIC) 7.5 MG tablet Take 1 tablet (7.5 mg total) by mouth 2 (two) times daily as needed for pain.  180 tablet  1  . Multiple Vitamin (MULTIVITAMIN) tablet Take 1 tablet by mouth daily.        . nitrofurantoin,  macrocrystal-monohydrate, (MACROBID) 100 MG capsule Take 1 capsule (100 mg total) by mouth 2 (two) times daily.  9 capsule  0  . Probiotic Product (PROBIOTIC DAILY PO) Take 1 tablet by mouth daily.       . sodium chloride (AYR) 0.65 % nasal spray Place 2 sprays into the nose as needed for congestion (bid and prn).  30 mL  12   No current facility-administered medications for this visit.    ROS: {Ros - complete:30496}  Exam:    BP 142/90  Pulse 100  Resp 18  Ht 5' 8.5" (1.74 m)  Wt 172 lb 3.2 oz (78.109 kg)  BMI 25.80 kg/m2 Weight change: @WEIGHTCHANGE @ Last 3 height recordings:  Ht Readings from Last 3 Encounters:  08/31/13 5' 8.5" (1.74 m)  08/23/13 5' 7.5" (1.715 m)  05/14/13 5' 7.5" (1.715 m)   General appearance: alert, cooperative and appears stated age Head: Normocephalic, without obvious abnormality, atraumatic Neck: no adenopathy, no carotid bruit, no JVD, supple, symmetrical, trachea midline and thyroid not enlarged, symmetric, no tenderness/mass/nodules Lungs: clear to auscultation bilaterally Breasts: {breast exam:13139::"normal appearance, no masses or tenderness"} Heart: regular rate and rhythm, S1, S2 normal, no murmur, click, rub or gallop Abdomen: soft, non-tender; bowel sounds normal; no masses,  no organomegaly Extremities: extremities normal, atraumatic, no cyanosis or edema Skin: Skin color, texture, turgor normal. No rashes or lesions Lymph nodes: Cervical, supraclavicular, and axillary nodes normal. no inguinal nodes palpated Neurologic: Grossly normal   Pelvic: External genitalia:  {Exam; genitalia female:32129}              Urethra: {urethra:311719::"not indicated"}              Bartholins and Skenes: {EXAM; GYN EAVWU:98119}VULVA:14487}                 Vagina: {vagina:315903::"normal appearing vagina with normal color and discharge, no lesions"}              Cervix: {exam; gyn cervix:30847}              Pap taken: {yes no:314532}        Bimanual Exam:  Uterus:   {uterus:315905::"uterus is normal size, shape, consistency and nontender"}                                      Adnexa:    {adnexa:311645::"not indicated"}  Rectovaginal: {Rectovaginal:16320}                                      Anus:  {Exam; anus:16940}  A: {Gyn assessment:5268::"well woman"} Contraceptive management     P: {plan; gyn:5269::"mammogram","pap smear","return annually or prn"} Discussed PAP guideline changes, importance of weight bearing exercises, calcium, vit D and balanced diet.  An After Visit Summary was printed and given to the patient.

## 2013-09-05 ENCOUNTER — Telehealth: Payer: Self-pay | Admitting: *Deleted

## 2013-09-05 NOTE — Telephone Encounter (Addendum)
S/w patient scheduled her 09/19/13 @ 12:00  Routed to provider, encounter closed.

## 2013-09-05 NOTE — Telephone Encounter (Signed)
Patient's pessary has arrived, calling patient to schedule pessary insertion.  Left Message To Call Back

## 2013-09-11 ENCOUNTER — Telehealth: Payer: Self-pay | Admitting: Gynecology

## 2013-09-11 NOTE — Telephone Encounter (Signed)
Estradiol (VAGIFEM) 10 MCG TABS vaginal tablet  Place 1 tablet (10 mcg total) vaginally 2 (two) times a week. nightly for two weeks, then twice a week per vagina, Starting 08/31/2013, Until Discontinued, Normal, Last Dose: Not Recorded  Refills: 0 ordered Pharmacy: [facility] [place] is calling to verify some directions on medication

## 2013-09-11 NOTE — Telephone Encounter (Signed)
Advised pharmacist [facility] that orders are correct per order. Dispense 18 initially and then refill with 8.   Routing to provider for final review. Patient agreeable to disposition. Will close encounter

## 2013-09-19 ENCOUNTER — Ambulatory Visit (INDEPENDENT_AMBULATORY_CARE_PROVIDER_SITE_OTHER): Payer: 59 | Admitting: Gynecology

## 2013-09-19 VITALS — BP 140/88 | HR 80 | Resp 18 | Ht 68.5 in | Wt 172.0 lb

## 2013-09-19 DIAGNOSIS — N898 Other specified noninflammatory disorders of vagina: Secondary | ICD-10-CM

## 2013-09-19 DIAGNOSIS — IMO0002 Reserved for concepts with insufficient information to code with codable children: Secondary | ICD-10-CM

## 2013-09-19 DIAGNOSIS — N8111 Cystocele, midline: Secondary | ICD-10-CM

## 2013-09-19 MED ORDER — METRONIDAZOLE 500 MG PO TABS: 500.0000 mg | ORAL_TABLET | Freq: Two times a day (BID) | ORAL | Status: DC

## 2013-09-19 NOTE — Progress Notes (Addendum)
Subjective:     Patient ID: Ann Barton, female   DOB: [birth year], 58 y.o.   MRN: [id number]  HPI Comments: Pt here for pessary fitting.  She was started on vagifem 2/20 for atrophic vaginitis.  Pt states that she still feels the vaginal discharge is unchanged.  Otherwise without complaints. We received and reviewed her operative note-TVH with Mc Calls culdoplasty-no anterior bladder work    Review of Systems Per HPI    Objective:   Physical Exam  Nursing note and vitals reviewed. Constitutional: She is oriented to person, place, and time. She appears well-developed and well-nourished.  Neurological: She is alert and oriented to person, place, and time.  Skin: Skin is warm and dry.   Pelvic: External genitalia:  no lesions              Urethra:  normal appearing urethra with no masses, tenderness or lesions              Bartholins and Skenes: normal                 Vagina: normal appearing vagina with scant white discharge, no lesions, cystocele              Cervix: absent                                       WP done    Assessment:     Vaginal discharge Atrophic vaginitis Symptomatic cystocele     Plan:     WP unchanged wbc noted, now with mild whiff+, pt interested in treatment-flagyl sent in  #2 ring pessary with support, LOT:  [id number], #[id number] placed in vault.  Pt comfortable after placement.  Pt instructed to ADL's and to return to office atfer a few hours for recheck      Pt here follow up after pessary placed, no complaints

## 2013-10-03 ENCOUNTER — Encounter: Payer: Self-pay | Admitting: Internal Medicine

## 2013-10-03 ENCOUNTER — Other Ambulatory Visit: Payer: 59

## 2013-10-03 ENCOUNTER — Ambulatory Visit (INDEPENDENT_AMBULATORY_CARE_PROVIDER_SITE_OTHER): Payer: 59 | Admitting: Gynecology

## 2013-10-03 ENCOUNTER — Ambulatory Visit (INDEPENDENT_AMBULATORY_CARE_PROVIDER_SITE_OTHER): Payer: 59 | Admitting: Internal Medicine

## 2013-10-03 VITALS — BP 122/70 | HR 82 | Ht 68.5 in | Wt 172.8 lb

## 2013-10-03 VITALS — BP 142/90 | HR 64 | Resp 16 | Ht 68.5 in | Wt 172.0 lb

## 2013-10-03 DIAGNOSIS — N898 Other specified noninflammatory disorders of vagina: Secondary | ICD-10-CM

## 2013-10-03 DIAGNOSIS — N952 Postmenopausal atrophic vaginitis: Secondary | ICD-10-CM

## 2013-10-03 DIAGNOSIS — N8111 Cystocele, midline: Secondary | ICD-10-CM

## 2013-10-03 DIAGNOSIS — J302 Other seasonal allergic rhinitis: Secondary | ICD-10-CM

## 2013-10-03 DIAGNOSIS — J309 Allergic rhinitis, unspecified: Secondary | ICD-10-CM

## 2013-10-03 DIAGNOSIS — IMO0002 Reserved for concepts with insufficient information to code with codable children: Secondary | ICD-10-CM

## 2013-10-03 DIAGNOSIS — J3089 Other allergic rhinitis: Secondary | ICD-10-CM

## 2013-10-03 NOTE — Progress Notes (Signed)
10/03/13- 14 yoF former smoker referred courtesy of Dr Charlett Blake for allergy. Complains of nasal congestion, pressure in the ears, red eyes-worse in spring and fall. Ears pop and feel like fluid draining. Treated with nasal sprays which burned her throat. Allegra overdries.  Triggers include strong odors, seasonal pollens-spring and fall. Never allergy tested. Denies history of asthma. Environment: House-basement, central air conditioning, hardwood floors, oil heat, husband smokes outside, 2 cats, no mold. Family has similar problems.  Prior to Admission medications   Medication Sig Start Date End Date Taking? Authorizing Provider  Biotin 5000 MCG CAPS Take 1 capsule by mouth daily.    Yes Historical Provider, MD  chlorthalidone (HYGROTON) 25 MG tablet Take 1 tablet (25 mg total) by mouth daily. 08/23/13  Yes Mosie Lukes, MD  Cholecalciferol (VITAMIN D3) 5000 UNITS CAPS Take 1 capsule by mouth daily.    Yes Historical Provider, MD  Coenzyme Q10 (COQ-10) 200 MG CAPS Take 1 tablet by mouth daily.    Yes Historical Provider, MD  diphenhydrAMINE (BENADRYL) 25 mg capsule Take 25 mg by mouth as needed for itching or sleep.   Yes Historical Provider, MD  ibuprofen (ADVIL,MOTRIN) 200 MG tablet Take 200 mg by mouth every 6 (six) hours as needed for pain.   Yes Historical Provider, MD  meloxicam (MOBIC) 7.5 MG tablet Take 1 tablet (7.5 mg total) by mouth 2 (two) times daily as needed for pain. 08/23/13  Yes Mosie Lukes, MD  Multiple Vitamin (MULTIVITAMIN) tablet Take 1 tablet by mouth daily.     Yes Historical Provider, MD  Probiotic Product (PROBIOTIC DAILY PO) Take 1 tablet by mouth daily.    Yes Historical Provider, MD  sodium chloride (AYR) 0.65 % nasal spray Place 2 sprays into the nose as needed for congestion (bid and prn). 04/19/12  Yes Mosie Lukes, MD   Past Medical History  Diagnosis Date  . Chicken pox as a child  . Mumps as a child  . Measles as a child  . Arthritis     left hip, low back   . Allergy     seasonal  . Hypertension   . 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 Goiter   . Pharyngitis, acute 03/09/2011  . Neutropenia 03/10/2011  . Urinary frequency 04/27/2011  . Vaginitis 04/27/2011  . Cough 04/19/2012  . Atrophic vaginitis 01/25/2011  . Bladder prolapse, female, acquired 05/16/2013  . Other and unspecified hyperlipidemia 08/23/2013  . Other malaise and fatigue 08/26/2013   Past Surgical History  Procedure Laterality Date  . Cholecystectomy  1998  . Tonsillectomy  as a child  . Laser surgery on vericose veins      b/l  . In grown toenails removed      partial, b/l  . Hernia repair Right     right, inguinal  . Vaginal hysterectomy  1988    done for polapse  . Biopsy thyroid      FNA, benign  . Breast biopsy Left     FNA, benign   Family History  Problem Relation Age of Onset  . Atrial fibrillation Mother   . Obesity Mother   . Diabetes Mother 35    Type 2  . Arthritis Mother     in right knee  . Heart disease Mother     atrial fibrillation, CHF  . Hernia Mother     never recovered from repair  . Hypertension Father   . Ulcers Father   . Pulmonary fibrosis Father   .  Diabetes Maternal Grandmother     type 2?  . Heart attack Maternal Grandmother   . Hearing loss Maternal Grandmother     MI  . Parkinsonism Maternal Grandfather   . Stroke Paternal Grandmother    History   Social History  . Marital Status: Married    Spouse Name: N/A    Number of Children: 2  . Years of Education: N/A   Occupational History  . [employer]   Social History Main Topics  . Smoking status: Former Smoker -- 1.00 packs/day for 5 years    Types: Cigarettes    Quit date: 07/12/1976  . Smokeless tobacco: Never Used  . Alcohol Use: Yes     Comment: occasionally  . Drug Use: No  . Sexual Activity: Yes    Partners: Male     Comment: no dietary restrictions. but does minimize refined sugar   Other Topics Concern  . Not on file   Social History  Narrative  . No narrative on file   ROS-see HPI Constitutional:   No-   weight loss, night sweats, fevers, chills, fatigue, lassitude. HEENT:   No-  headaches, difficulty swallowing, tooth/dental problems, sore throat,       No-  sneezing, +itching, +ear ache, +nasal congestion, post nasal drip,  CV:  No-   chest pain, orthopnea, PND, swelling in lower extremities, anasarca,                                  dizziness, palpitations Resp: No-   shortness of breath with exertion or at rest.              No-   productive cough,  No non-productive cough,  No- coughing up of blood.              No-   change in color of mucus.  No- wheezing.   Skin: No-   rash or lesions. GI:  No-   heartburn, indigestion, abdominal pain, nausea, vomiting, diarrhea,                 change in bowel habits, loss of appetite GU: No-   dysuria, change in color of urine, no urgency or frequency.  No- flank pain. MS:  No-   joint pain or swelling.  No- decreased range of motion.  No- back pain. Neuro-     nothing unusual Psych:  No- change in mood or affect. No depression or anxiety.  No memory loss.  OBJ- Physical Exam General- Alert, Oriented, Affect-appropriate, Distress- none acute Skin- rash-none, lesions- none, excoriation- none Lymphadenopathy- none Head- atraumatic            Eyes- Gross vision intact, PERRLA, conjunctivae and secretions clear            Ears- Hearing, canals-normal            Nose- Clear, no-Septal dev, mucus, polyps, erosion, perforation             Throat- Mallampati II , mucosa clear , drainage- none, tonsils- atrophic Neck- flexible , trachea midline, no stridor , thyroid nl, carotid no bruit Chest - symmetrical excursion , unlabored           Heart/CV- RRR , no murmur , no gallop  , no rub, nl s1 s2                           -  JVD- none , edema- none, stasis changes- none, varices- none           Lung- clear to P&A, wheeze- none, cough- none , dullness-none, rub- none            Chest wall-  Abd- tender-no, distended-no, bowel sounds-present, HSM- no Br/ Gen/ Rectal- Not done, not indicated Extrem- cyanosis- none, clubbing, none, atrophy- none, strength- nl Neuro- grossly intact to observation

## 2013-10-03 NOTE — Patient Instructions (Addendum)
Order- lab- Allergy profile  Dx allergic rhinitis  Ok to try an otc nasal steroid spray like Nasacort or Flonase     1-2 puffs each nostril once daily at bedtime  Ok to use an otc antihistamine - perhaps Claritin/ loratadine    1 daily or every other day, as needed

## 2013-10-03 NOTE — Progress Notes (Signed)
Subjective:     Patient ID: Ann Barton, female   DOB: 04-Feb-1956, 58 y.o.   MRN: [id number]  HPI Comments: Pt here for 2w follow up after pessary placement. Pt reports that she is having pain with removal of the pessary which she does for sex and bowel movements.  Otherwise, she is very pleased regarding the cystocele support.  Pt reports still having some vaginal drainage, she took the antibiotics as prescribed, she is not on vaginal estrogen    Review of Systems  Gastrointestinal: Negative for constipation.  Genitourinary: Positive for vaginal discharge. Negative for vaginal bleeding, difficulty urinating, vaginal pain, pelvic pain and dyspareunia.  All other systems reviewed and are negative.       Objective:   Physical Exam  Nursing note and vitals reviewed. Constitutional: She is oriented to person, place, and time. She appears well-developed and well-nourished.  Neurological: She is alert and oriented to person, place, and time.   Pelvic exam:  VULVA: normal appearing vulva with no masses, tenderness or lesions,  VAGINA: atrophic, vaginal discharge - white, odorless, scant and thin, no excoriations or lesions,  CERVIX: surgically absent, no palpable internal organs.     Assessment:     Cystocele Vaginal discharge Pessary follow up     Plan:     Pessary appears to be working well, suggested pt try lubricants to help with removal or have spouse help-she is agreeable Pt treated, no affect, will try water and vinegar douche F/u 28m if she is unable to remove and clean on regular basis F/u prn

## 2013-10-04 LAB — ALLERGY FULL PROFILE
Allergen, D pternoyssinus,d7: <0.1 kU/L
Allergen,Goose feathers, e70: <0.1 kU/L
Alternaria Alternata: <0.1 kU/L
Aspergillus fumigatus, m3: <0.1 kU/L
Bahia Grass: <0.1 kU/L
Box Elder IgE: <0.1 kU/L
Cat Dander: <0.1 kU/L
Common Ragweed: <0.1 kU/L
D. farinae: <0.1 kU/L
Dog Dander: <0.1 kU/L
Fescue: <0.1 kU/L
Goldenrod: <0.1 kU/L
Helminthosporium halodes: <0.1 kU/L
House Dust Hollister: <0.1 kU/L
IgE (Immunoglobulin E), Serum: 2.6 IU/mL (ref 0.0–180.0)
Lamb's Quarters: <0.1 kU/L
Oak: <0.1 kU/L
Plantain: <0.1 kU/L
Stemphylium Botryosum: <0.1 kU/L
Sycamore Tree: <0.1 kU/L

## 2013-10-16 ENCOUNTER — Telehealth: Payer: Self-pay | Admitting: *Deleted

## 2013-10-16 NOTE — Telephone Encounter (Signed)
Left message for pt to return my call and let us know if she has had a formal sleep study other than the 2 home sleep studies as they were inconclusive due to poor lead connectivity. If she has not had a formal sleep study then that is what Dr Charlett Blake is currently recommending. See below recommendation per Dr Gwenette Greet.

## 2013-10-16 NOTE — Telephone Encounter (Signed)
Message copied by Ronny Flurry on Tue Oct 16, 2013  9:08 AM ------      Message from: Penni Homans A      Created: Mon Oct 15, 2013  5:11 PM       I cannot find any results of sleep studies in chart. Please check with patient to see who did the last sleep study so we can get a copy and then I can advise. Find out if she is having any concerning symptoms still and I can address when I return to office       ----- Message -----         From: Synthia Innocent         Sent: 10/15/2013   4:09 PM           To: Mosie Lukes, MD            Pt has already done at home sleep test 2x, has not heard any results. Requesting to speak with you. Thanks      ----- Message -----         From: Mosie Lukes, MD         Sent: 10/12/2013  10:22 AM           To: Synthia Innocent            So patient could not complete home study, see note below. See if patient is willing to proceed with an formal in house sleep study, if so I will order. Thanks      ----- Message -----         From: Kathee Delton, MD         Sent: 10/11/2013   2:36 PM           To: Mosie Lukes, MD            Erline Levine, this pt has had home sleep study twice, and both times has been unable to keep leads on or has had difficulty with leads slipping.  We are unable to get adequate data for accurate diagnosis.  We occasionally get a pt that has this issue.  If you are suspicious for osa, will need to have formal NPSG.  Sorry .            Lanny Hurst                   ------

## 2013-10-28 NOTE — Assessment & Plan Note (Addendum)
There may be mixed allergic and nonallergic rhinitis as indicated by her seasonal pattern and response to strong odors. Plan-allergy profile, environmental precautions, explained use of antihistamines, decongestants and nasal steroid sprays.

## 2013-11-14 NOTE — Telephone Encounter (Signed)
Pt has f/u with Dr Charlett Blake on 11/20/13. Discuss with pt at that time?Marland Kitchen

## 2013-11-15 ENCOUNTER — Ambulatory Visit: Payer: 59 | Admitting: Internal Medicine

## 2013-11-20 ENCOUNTER — Ambulatory Visit: Payer: 59 | Admitting: Family Medicine

## 2013-12-04 ENCOUNTER — Ambulatory Visit (INDEPENDENT_AMBULATORY_CARE_PROVIDER_SITE_OTHER): Payer: 59 | Admitting: Family Medicine

## 2013-12-04 ENCOUNTER — Encounter: Payer: Self-pay | Admitting: Family Medicine

## 2013-12-04 VITALS — BP 142/80 | HR 84 | Temp 97.7°F | Ht 68.8 in | Wt 172.1 lb

## 2013-12-04 DIAGNOSIS — G4730 Sleep apnea, unspecified: Secondary | ICD-10-CM

## 2013-12-04 DIAGNOSIS — R5383 Other fatigue: Secondary | ICD-10-CM

## 2013-12-04 DIAGNOSIS — R350 Frequency of micturition: Secondary | ICD-10-CM

## 2013-12-04 DIAGNOSIS — E785 Hyperlipidemia, unspecified: Secondary | ICD-10-CM

## 2013-12-04 DIAGNOSIS — N8110 Cystocele, unspecified: Secondary | ICD-10-CM

## 2013-12-04 DIAGNOSIS — R5381 Other malaise: Secondary | ICD-10-CM

## 2013-12-04 DIAGNOSIS — N8111 Cystocele, midline: Secondary | ICD-10-CM

## 2013-12-04 DIAGNOSIS — I10 Essential (primary) hypertension: Secondary | ICD-10-CM

## 2013-12-04 NOTE — Patient Instructions (Addendum)
Can consider calling insurance to see if they will pay for a Zostavax/shingles shot prior to age 58    Sleep Apnea  Sleep apnea is a sleep disorder characterized by abnormal pauses in breathing while you sleep. When your breathing pauses, the level of oxygen in your blood decreases. This causes you to move out of deep sleep and into light sleep. As a result, your quality of sleep is poor, and the system that carries your blood throughout your body (cardiovascular system) experiences stress. If sleep apnea remains untreated, the following conditions can develop:  High blood pressure (hypertension).  Coronary artery disease.  Inability to achieve or maintain an erection (impotence).  Impairment of your thought process (cognitive dysfunction). There are three types of sleep apnea: 1. Obstructive sleep apnea Pauses in breathing during sleep because of a blocked airway. 2. Central sleep apnea Pauses in breathing during sleep because the area of the brain that controls your breathing does not send the correct signals to the muscles that control breathing. 3. Mixed sleep apnea A combination of both obstructive and central sleep apnea. RISK FACTORS The following risk factors can increase your risk of developing sleep apnea:  Being overweight.  Smoking.  Having narrow passages in your nose and throat.  Being of older age.  Being female.  Alcohol use.  Sedative and tranquilizer use.  Ethnicity. Among individuals younger than 35 years, African Americans are at increased risk of sleep apnea. SYMPTOMS   Difficulty staying asleep.  Daytime sleepiness and fatigue.  Loss of energy.  Irritability.  Loud, heavy snoring.  Morning headaches.  Trouble concentrating.  Forgetfulness.  Decreased interest in sex. DIAGNOSIS  In order to diagnose sleep apnea, your caregiver will perform a physical examination. Your caregiver may suggest that you take a home sleep test. Your caregiver may  also recommend that you spend the night in a sleep lab. In the sleep lab, several monitors record information about your heart, lungs, and brain while you sleep. Your leg and arm movements and blood oxygen level are also recorded. TREATMENT The following actions may help to resolve mild sleep apnea:  Sleeping on your side.   Using a decongestant if you have nasal congestion.   Avoiding the use of depressants, including alcohol, sedatives, and narcotics.   Losing weight and modifying your diet if you are overweight. There also are devices and treatments to help open your airway:  Oral appliances. These are custom-made mouthpieces that shift your lower jaw forward and slightly open your bite. This opens your airway.  Devices that create positive airway pressure. This positive pressure "splints" your airway open to help you breathe better during sleep. The following devices create positive airway pressure:  Continuous positive airway pressure (CPAP) device. The CPAP device creates a continuous level of air pressure with an air pump. The air is delivered to your airway through a mask while you sleep. This continuous pressure keeps your airway open.  Nasal expiratory positive airway pressure (EPAP) device. The EPAP device creates positive air pressure as you exhale. The device consists of single-use valves, which are inserted into each nostril and held in place by adhesive. The valves create very little resistance when you inhale but create much more resistance when you exhale. That increased resistance creates the positive airway pressure. This positive pressure while you exhale keeps your airway open, making it easier to breath when you inhale again.  Bilevel positive airway pressure (BPAP) device. The BPAP device is used mainly in patients  with central sleep apnea. This device is similar to the CPAP device because it also uses an air pump to deliver continuous air pressure through a mask.  However, with the BPAP machine, the pressure is set at two different levels. The pressure when you exhale is lower than the pressure when you inhale.  Surgery. Typically, surgery is only done if you cannot comply with less invasive treatments or if the less invasive treatments do not improve your condition. Surgery involves removing excess tissue in your airway to create a wider passage way. Document Released: 06/18/2002 Document Revised: 10/23/2012 Document Reviewed: 11/04/2011 [facility] Patient Information 2014 [place].

## 2013-12-04 NOTE — Progress Notes (Signed)
Pre visit review using our clinic review tool, if applicable. No additional management support is needed unless otherwise documented below in the visit note. 

## 2013-12-05 ENCOUNTER — Other Ambulatory Visit: Payer: Self-pay | Admitting: Family Medicine

## 2013-12-05 DIAGNOSIS — G4730 Sleep apnea, unspecified: Secondary | ICD-10-CM

## 2013-12-09 ENCOUNTER — Encounter: Payer: Self-pay | Admitting: Family Medicine

## 2013-12-09 NOTE — Progress Notes (Signed)
Patient ID: Ann Barton, female   DOB: 22-Aug-1955, 58 y.o.   MRN: [id number] Ann Barton [id number] [birth year] 12/09/2013      Progress Note-Follow Up  Subjective  Chief Complaint  Chief Complaint  Patient presents with  . Follow-up    HPI  Patient is a 58 year old female in today for routine medical care. Is struggling with heavy fatigue, restless sleep and headaches. She has gotten a Pessary from GYN and it is helping her urinary symptoms significantly. She is able to clean and replace herself. No recent illness does noe persistent leeft hip pain. Well controlled, no changes to meds. Encouraged heart healthy diet such as the DASH diet and exercise as tolerated.   Past Medical History  Diagnosis Date  . Chicken pox as a child  . Mumps as a child  . Measles as a child  . Arthritis     left hip, low back  . Allergy     seasonal  . Hypertension   . 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 Goiter   . Pharyngitis, acute 03/09/2011  . Neutropenia 03/10/2011  . Urinary frequency 04/27/2011  . Vaginitis 04/27/2011  . Cough 04/19/2012  . Atrophic vaginitis 01/25/2011  . Bladder prolapse, female, acquired 05/16/2013  . Other and unspecified hyperlipidemia 08/23/2013  . Other malaise and fatigue 08/26/2013    Past Surgical History  Procedure Laterality Date  . Cholecystectomy  1998  . Tonsillectomy  as a child  . Laser surgery on vericose veins      b/l  . In grown toenails removed      partial, b/l  . Hernia repair Right     right, inguinal  . Vaginal hysterectomy  1988    done for polapse  . Biopsy thyroid      FNA, benign  . Breast biopsy Left     FNA, benign    Family History  Problem Relation Age of Onset  . Atrial fibrillation Mother   . Obesity Mother   . Diabetes Mother 72    Type 2  . Arthritis Mother     in right knee  . Heart disease Mother     atrial fibrillation, CHF  . Hernia Mother     never recovered from repair  . Hypertension Father   . Ulcers  Father   . Pulmonary fibrosis Father   . Diabetes Maternal Grandmother     type 2?  . Heart attack Maternal Grandmother   . Hearing loss Maternal Grandmother     MI  . Parkinsonism Maternal Grandfather   . Stroke Paternal Grandmother     History   Social History  . Marital Status: Married    Spouse Name: N/A    Number of Children: 2  . Years of Education: N/A   Occupational History  . [employer]   Social History Main Topics  . Smoking status: Former Smoker -- 1.00 packs/day for 5 years    Types: Cigarettes    Quit date: 07/12/1976  . Smokeless tobacco: Never Used  . Alcohol Use: Yes     Comment: occasionally  . Drug Use: No  . Sexual Activity: Yes    Partners: Male     Comment: no dietary restrictions. but does minimize refined sugar   Other Topics Concern  . Not on file   Social History Narrative  . No narrative on file    Current Outpatient Prescriptions on File Prior to Visit  Medication Sig Dispense  Refill  . Biotin 5000 MCG CAPS Take 1 capsule by mouth daily.       . chlorthalidone (HYGROTON) 25 MG tablet Take 1 tablet (25 mg total) by mouth daily.  90 tablet  1  . Cholecalciferol (VITAMIN D3) 5000 UNITS CAPS Take 1 capsule by mouth daily.       . Coenzyme Q10 (COQ-10) 200 MG CAPS Take 1 tablet by mouth daily.       . diphenhydrAMINE (BENADRYL) 25 mg capsule Take 25 mg by mouth as needed for itching or sleep.      Marland Kitchen ibuprofen (ADVIL,MOTRIN) 200 MG tablet Take 200 mg by mouth every 6 (six) hours as needed for pain.      . meloxicam (MOBIC) 7.5 MG tablet Take 1 tablet (7.5 mg total) by mouth 2 (two) times daily as needed for pain.  180 tablet  1  . Multiple Vitamin (MULTIVITAMIN) tablet Take 1 tablet by mouth daily.        . Probiotic Product (PROBIOTIC DAILY PO) Take 1 tablet by mouth daily.       . sodium chloride (AYR) 0.65 % nasal spray Place 2 sprays into the nose as needed for congestion (bid and prn).  30 mL  12   No current facility-administered  medications on file prior to visit.    Allergies  Allergen Reactions  . Lisinopril Cough    Review of Systems  Review of Systems  Constitutional: Positive for malaise/fatigue. Negative for fever.  HENT: Negative for congestion.   Eyes: Negative for discharge.  Respiratory: Positive for shortness of breath.   Cardiovascular: Negative for chest pain, palpitations and leg swelling.  Gastrointestinal: Negative for nausea, abdominal pain and diarrhea.  Genitourinary: Positive for frequency. Negative for dysuria, urgency and hematuria.  Musculoskeletal: Positive for joint pain. Negative for falls.       Left hip pain  Skin: Negative for rash.  Neurological: Negative for loss of consciousness and headaches.  Endo/Heme/Allergies: Negative for polydipsia.  Psychiatric/Behavioral: Negative for depression and suicidal ideas. The patient is not nervous/anxious and does not have insomnia.     Objective  BP 142/80  Pulse 84  Temp(Src) 97.7 F (36.5 C) (Oral)  Ht 5' 8.8" (1.748 m)  Wt 172 lb 1.3 oz (78.055 kg)  BMI 25.55 kg/m2  SpO2 96%  Physical Exam  Physical Exam  Constitutional: She is oriented to person, place, and time and well-developed, well-nourished, and in no distress. No distress.  HENT:  Head: Normocephalic and atraumatic.  Eyes: Conjunctivae are normal.  Neck: Neck supple. No thyromegaly present.  Cardiovascular: Normal rate, regular rhythm and normal heart sounds.   No murmur heard. Pulmonary/Chest: Effort normal and breath sounds normal. She has no wheezes.  Abdominal: She exhibits no distension and no mass.  Musculoskeletal: She exhibits no edema.  Lymphadenopathy:    She has no cervical adenopathy.  Neurological: She is alert and oriented to person, place, and time.  Skin: Skin is warm and dry. No rash noted. She is not diaphoretic.  Psychiatric: Memory, affect and judgment normal.    Lab Results  Component Value Date   TSH 1.834 08/23/2013   Lab Results   Component Value Date   WBC 4.8 08/23/2013   HGB 12.6 08/23/2013   HCT 37.4 08/23/2013   MCV 88.6 08/23/2013   PLT 259 08/23/2013   Lab Results  Component Value Date   CREATININE 0.71 08/23/2013   BUN 20 08/23/2013   NA 142 08/23/2013   K  3.9 08/23/2013   CL 104 08/23/2013   CO2 31 08/23/2013   Lab Results  Component Value Date   ALT 30 05/14/2013   AST 27 05/14/2013   ALKPHOS 54 05/14/2013   BILITOT 0.3 05/14/2013   Lab Results  Component Value Date   CHOL 146 08/23/2013   Lab Results  Component Value Date   HDL 52 08/23/2013   Lab Results  Component Value Date   LDLCALC 74 08/23/2013   Lab Results  Component Value Date   TRIG 99 08/23/2013   Lab Results  Component Value Date   CHOLHDL 2.8 08/23/2013     Assessment & Plan  Hypertension Well controlled, no changes to meds. Encouraged heart healthy diet such as the DASH diet and exercise as tolerated.   Bladder prolapse, female, acquired Tolerating Pessary and placing herself. No concerns, helping symptoms  Urinary frequency Pessary helping  Other and unspecified hyperlipidemia  encouraged heart healthy diet, avoid trans fats, minimize simple carbs and saturated fats. Increase exercise as tolerated.   Other malaise and fatigue Poor sleep with snoring and am HA. Will proceed with sleep study

## 2013-12-09 NOTE — Assessment & Plan Note (Signed)
Well controlled, no changes to meds. Encouraged heart healthy diet such as the DASH diet and exercise as tolerated.  °

## 2013-12-09 NOTE — Assessment & Plan Note (Signed)
Poor sleep with snoring and am HA. Will proceed with sleep study

## 2013-12-09 NOTE — Assessment & Plan Note (Signed)
encouraged heart healthy diet, avoid trans fats, minimize simple carbs and saturated fats. Increase exercise as tolerated 

## 2013-12-09 NOTE — Assessment & Plan Note (Signed)
Tolerating Pessary and placing herself. No concerns, helping symptoms

## 2013-12-09 NOTE — Assessment & Plan Note (Signed)
Pessary helping

## 2014-01-07 ENCOUNTER — Ambulatory Visit: Payer: 59 | Admitting: Gynecology

## 2014-04-11 ENCOUNTER — Ambulatory Visit: Payer: Self-pay | Admitting: Otolaryngology

## 2014-05-13 ENCOUNTER — Encounter: Payer: Self-pay | Admitting: Family Medicine

## 2014-06-04 ENCOUNTER — Encounter: Payer: Self-pay | Admitting: Family Medicine

## 2014-06-04 ENCOUNTER — Ambulatory Visit (INDEPENDENT_AMBULATORY_CARE_PROVIDER_SITE_OTHER): Payer: 59 | Admitting: Family Medicine

## 2014-06-04 VITALS — BP 144/80 | HR 75 | Temp 97.5°F | Ht 68.5 in | Wt 175.0 lb

## 2014-06-04 DIAGNOSIS — I10 Essential (primary) hypertension: Secondary | ICD-10-CM

## 2014-06-04 DIAGNOSIS — J3089 Other allergic rhinitis: Secondary | ICD-10-CM

## 2014-06-04 DIAGNOSIS — J302 Other seasonal allergic rhinitis: Secondary | ICD-10-CM

## 2014-06-04 DIAGNOSIS — M1990 Unspecified osteoarthritis, unspecified site: Secondary | ICD-10-CM

## 2014-06-04 DIAGNOSIS — J309 Allergic rhinitis, unspecified: Secondary | ICD-10-CM

## 2014-06-04 DIAGNOSIS — T7840XD Allergy, unspecified, subsequent encounter: Secondary | ICD-10-CM

## 2014-06-04 NOTE — Patient Instructions (Signed)
Curcumen/Turmeric Tylenol ES 500 twice a day for pain and Meloxicam only as needed  Minimize caffeine or salt  Arthritis, Nonspecific Arthritis is inflammation of a joint. This usually means pain, redness, warmth or swelling are present. One or more joints may be involved. There are a number of types of arthritis. Your caregiver may not be able to tell what type of arthritis you have right away. CAUSES  The most common cause of arthritis is the wear and tear on the joint (osteoarthritis). This causes damage to the cartilage, which can break down over time. The knees, hips, back and neck are most often affected by this type of arthritis. Other types of arthritis and common causes of joint pain include:  Sprains and other injuries near the joint. Sometimes minor sprains and injuries cause pain and swelling that develop hours later.  Rheumatoid arthritis. This affects hands, feet and knees. It usually affects both sides of your body at the same time. It is often associated with chronic ailments, fever, weight loss and general weakness.  Crystal arthritis. Gout and pseudo gout can cause occasional acute severe pain, redness and swelling in the foot, ankle, or knee.  Infectious arthritis. Bacteria can get into a joint through a break in overlying skin. This can cause infection of the joint. Bacteria and viruses can also spread through the blood and affect your joints.  Drug, infectious and allergy reactions. Sometimes joints can become mildly painful and slightly swollen with these types of illnesses. SYMPTOMS   Pain is the main symptom.  Your joint or joints can also be red, swollen and warm or hot to the touch.  You may have a fever with certain types of arthritis, or even feel overall ill.  The joint with arthritis will hurt with movement. Stiffness is present with some types of arthritis. DIAGNOSIS  Your caregiver will suspect arthritis based on your description of your symptoms and on  your exam. Testing may be needed to find the type of arthritis:  Blood and sometimes urine tests.  X-ray tests and sometimes CT or MRI scans.  Removal of fluid from the joint (arthrocentesis) is done to check for bacteria, crystals or other causes. Your caregiver (or a specialist) will numb the area over the joint with a local anesthetic, and use a needle to remove joint fluid for examination. This procedure is only minimally uncomfortable.  Even with these tests, your caregiver may not be able to tell what kind of arthritis you have. Consultation with a specialist (rheumatologist) may be helpful. TREATMENT  Your caregiver will discuss with you treatment specific to your type of arthritis. If the specific type cannot be determined, then the following general recommendations may apply. Treatment of severe joint pain includes:  Rest.  Elevation.  Anti-inflammatory medication (for example, ibuprofen) may be prescribed. Avoiding activities that cause increased pain.  Only take over-the-counter or prescription medicines for pain and discomfort as recommended by your caregiver.  Cold packs over an inflamed joint may be used for 10 to 15 minutes every hour. Hot packs sometimes feel better, but do not use overnight. Do not use hot packs if you are diabetic without your caregiver's permission.  A cortisone shot into arthritic joints may help reduce pain and swelling.  Any acute arthritis that gets worse over the next 1 to 2 days needs to be looked at to be sure there is no joint infection. Long-term arthritis treatment involves modifying activities and lifestyle to reduce joint stress jarring. This can  include weight loss. Also, exercise is needed to nourish the joint cartilage and remove waste. This helps keep the muscles around the joint strong. HOME CARE INSTRUCTIONS   Do not take aspirin to relieve pain if gout is suspected. This elevates uric acid levels.  Only take over-the-counter or  prescription medicines for pain, discomfort or fever as directed by your caregiver.  Rest the joint as much as possible.  If your joint is swollen, keep it elevated.  Use crutches if the painful joint is in your leg.  Drinking plenty of fluids may help for certain types of arthritis.  Follow your caregiver's dietary instructions.  Try low-impact exercise such as:  Swimming.  Water aerobics.  Biking.  Walking.  Morning stiffness is often relieved by a warm shower.  Put your joints through regular range-of-motion. SEEK MEDICAL CARE IF:   You do not feel better in 24 hours or are getting worse.  You have side effects to medications, or are not getting better with treatment. SEEK IMMEDIATE MEDICAL CARE IF:   You have a fever.  You develop severe joint pain, swelling or redness.  Many joints are involved and become painful and swollen.  There is severe back pain and/or leg weakness.  You have loss of bowel or bladder control. Document Released: 08/05/2004 Document Revised: 09/20/2011 Document Reviewed: 08/21/2008 [facility] Patient Information 2015 [place], [place]. This information is not intended to replace advice given to you by your health care provider. Make sure you discuss any questions you have with your health care provider.

## 2014-06-09 ENCOUNTER — Encounter: Payer: Self-pay | Admitting: Family Medicine

## 2014-06-09 NOTE — Assessment & Plan Note (Signed)
Hands respond to Meloxicam may continue take with food.

## 2014-06-09 NOTE — Progress Notes (Signed)
Ann Barton  [id number] 05-11-1956 06/09/2014      Progress Note-Follow Up  Subjective  Chief Complaint  Chief Complaint  Patient presents with  . Follow-up    6 month    HPI  Patient is a 58 y.o. female in today for routine medical care. Patient is in today for follow-up. She's recently had a nasal septoplasty and that has helped her breathing somewhat but she continues to have chronic congestion, postnasal drip, cough. No fevers or chills. No sore throat or ear pain. No other recent illness or acute concerns. Denies CP/palp/SOB/HA/congestion/fevers/GI or GU c/o. Taking meds as prescribed  Past Medical History  Diagnosis Date  . Chicken pox as a child  . Mumps as a child  . Measles as a child  . Arthritis     left hip, low back  . Allergy     seasonal  . Hypertension   . 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 Goiter   . Pharyngitis, acute 03/09/2011  . Neutropenia 03/10/2011  . Urinary frequency 04/27/2011  . Vaginitis 04/27/2011  . Cough 04/19/2012  . Atrophic vaginitis 01/25/2011  . Bladder prolapse, female, acquired 05/16/2013  . Other and unspecified hyperlipidemia 08/23/2013  . Other malaise and fatigue 08/26/2013    Past Surgical History  Procedure Laterality Date  . Cholecystectomy  1998  . Tonsillectomy  as a child  . Laser surgery on vericose veins      b/l  . In grown toenails removed      partial, b/l  . Hernia repair Right     right, inguinal  . Vaginal hysterectomy  1988    done for polapse  . Biopsy thyroid      FNA, benign  . Breast biopsy Left     FNA, benign  . Nasal septum surgery  2015    Family History  Problem Relation Age of Onset  . Atrial fibrillation Mother   . Obesity Mother   . Diabetes Mother 72    Type 2  . Arthritis Mother     in right knee  . Heart disease Mother     atrial fibrillation, CHF  . Hernia Mother     never recovered from repair  . Hypertension Father   . Ulcers Father   . Pulmonary fibrosis Father   .  Diabetes Maternal Grandmother     type 2?  . Heart attack Maternal Grandmother   . Hearing loss Maternal Grandmother     MI  . Parkinsonism Maternal Grandfather   . Stroke Paternal Grandmother     History   Social History  . Marital Status: Unknown    Spouse Name: N/A    Number of Children: 2  . Years of Education: N/A   Occupational History  . [employer]   Social History Main Topics  . Smoking status: Former Smoker -- 1.00 packs/day for 5 years    Types: Cigarettes    Quit date: 07/12/1976  . Smokeless tobacco: Never Used  . Alcohol Use: Yes     Comment: occasionally  . Drug Use: No  . Sexual Activity:    Partners: Male     Comment: no dietary restrictions. but does minimize refined sugar   Other Topics Concern  . Not on file   Social History Narrative    Current Outpatient Prescriptions on File Prior to Visit  Medication Sig Dispense Refill  . Biotin 5000 MCG CAPS Take 1 capsule by mouth daily.     Marland Kitchen  chlorthalidone (HYGROTON) 25 MG tablet Take 1 tablet (25 mg total) by mouth daily. 90 tablet 1  . Cholecalciferol (VITAMIN D3) 5000 UNITS CAPS Take 1 capsule by mouth daily.     . Coenzyme Q10 (COQ-10) 200 MG CAPS Take 1 tablet by mouth daily.     . diphenhydrAMINE (BENADRYL) 25 mg capsule Take 25 mg by mouth as needed for itching or sleep.    Marland Kitchen ibuprofen (ADVIL,MOTRIN) 200 MG tablet Take 200 mg by mouth every 6 (six) hours as needed for pain.    . meloxicam (MOBIC) 7.5 MG tablet Take 1 tablet (7.5 mg total) by mouth 2 (two) times daily as needed for pain. 180 tablet 1  . Multiple Vitamin (MULTIVITAMIN) tablet Take 1 tablet by mouth daily.      . Probiotic Product (PROBIOTIC DAILY PO) Take 1 tablet by mouth daily.     . sodium chloride (AYR) 0.65 % nasal spray Place 2 sprays into the nose as needed for congestion (bid and prn). 30 mL 12   No current facility-administered medications on file prior to visit.    Allergies  Allergen Reactions  . Lisinopril  Cough    Review of Systems  Review of Systems  Constitutional: Negative for fever and malaise/fatigue.  HENT: Positive for congestion.   Eyes: Negative for discharge.  Respiratory: Positive for cough. Negative for shortness of breath.   Cardiovascular: Negative for chest pain, palpitations and leg swelling.  Gastrointestinal: Negative for nausea, abdominal pain and diarrhea.  Genitourinary: Negative for dysuria.  Musculoskeletal: Negative for falls.  Skin: Negative for rash.  Neurological: Negative for loss of consciousness and headaches.  Endo/Heme/Allergies: Negative for polydipsia.  Psychiatric/Behavioral: Negative for depression and suicidal ideas. The patient is not nervous/anxious and does not have insomnia.     Objective  BP 144/80 mmHg  Pulse 75  Temp(Src) 97.5 F (36.4 C) (Oral)  Ht 5' 8.5" (1.74 m)  Wt 175 lb (79.379 kg)  BMI 26.22 kg/m2  SpO2 99%  Physical Exam  Physical Exam  Constitutional: She is oriented to person, place, and time and well-developed, well-nourished, and in no distress. No distress.  HENT:  Head: Normocephalic and atraumatic.  Eyes: Conjunctivae are normal.  Neck: Neck supple. No thyromegaly present.  Cardiovascular: Normal rate, regular rhythm and normal heart sounds.   No murmur heard. Pulmonary/Chest: Effort normal and breath sounds normal. She has no wheezes.  Abdominal: She exhibits no distension and no mass.  Musculoskeletal: She exhibits no edema.  Lymphadenopathy:    She has no cervical adenopathy.  Neurological: She is alert and oriented to person, place, and time.  Skin: Skin is warm and dry. No rash noted. She is not diaphoretic.  Psychiatric: Memory, affect and judgment normal.    Lab Results  Component Value Date   TSH 1.834 08/23/2013   Lab Results  Component Value Date   WBC 4.8 08/23/2013   HGB 12.6 08/23/2013   HCT 37.4 08/23/2013   MCV 88.6 08/23/2013   PLT 259 08/23/2013   Lab Results  Component Value  Date   CREATININE 0.71 08/23/2013   BUN 20 08/23/2013   NA 142 08/23/2013   K 3.9 08/23/2013   CL 104 08/23/2013   CO2 31 08/23/2013   Lab Results  Component Value Date   ALT 30 05/14/2013   AST 27 05/14/2013   ALKPHOS 54 05/14/2013   BILITOT 0.3 05/14/2013   Lab Results  Component Value Date   CHOL 146 08/23/2013   Lab  Results  Component Value Date   HDL 52 08/23/2013   Lab Results  Component Value Date   LDLCALC 74 08/23/2013   Lab Results  Component Value Date   TRIG 99 08/23/2013   Lab Results  Component Value Date   CHOLHDL 2.8 08/23/2013     Assessment & Plan  Hypertension Improved on recheck. encouraged DASH diet, minimize caffeine and obtain adequate sleep. Report concerning symptoms and follow up as directed and as needed  Seasonal and perennial allergic rhinitis Just had a septoplasty with Dr Kathyrn Sheriff of ENT in [facility] recently, this has been helpful  Arthritis Hands respond to Meloxicam may continue take with food.

## 2014-06-09 NOTE — Assessment & Plan Note (Signed)
Just had a septoplasty with Dr Kathyrn Sheriff of ENT in [facility] recently, this has been helpful

## 2014-06-09 NOTE — Assessment & Plan Note (Signed)
Improved on recheck. encouraged DASH diet, minimize caffeine and obtain adequate sleep. Report concerning symptoms and follow up as directed and as needed

## 2014-06-11 LAB — IGG FOOD PANEL
ALLERGEN EGG YOLK IGG: 16.3 ug/mL — AB (ref <2.0)
ALLERGEN PEANUT IGG: 1.5 ug/mL — AB (ref <0.15)
ALLERGEN WHEAT IGG: 5.26 ug/mL — AB (ref <0.15)
Allergen, Milk, IgG: 25.7 ug/mL — ABNORMAL HIGH (ref <0.15)
Beef, IgG: 7.8 ug/mL — ABNORMAL HIGH (ref <2.0)
Chicken, IgG: <0.15 ug/mL (ref <0.15)
Corn, IgG: 0.31 ug/mL — ABNORMAL HIGH (ref <0.15)
Egg white, IgG: 30.6 ug/mL — ABNORMAL HIGH (ref <2.0)

## 2014-06-12 ENCOUNTER — Encounter: Payer: Self-pay | Admitting: Family Medicine

## 2014-09-20 ENCOUNTER — Encounter: Payer: Self-pay | Admitting: Medical

## 2014-09-20 ENCOUNTER — Ambulatory Visit (INDEPENDENT_AMBULATORY_CARE_PROVIDER_SITE_OTHER): Payer: 59 | Admitting: Medical

## 2014-09-20 VITALS — BP 154/95 | HR 103 | Temp 98.2°F | Ht 68.5 in | Wt 165.4 lb

## 2014-09-20 DIAGNOSIS — J209 Acute bronchitis, unspecified: Secondary | ICD-10-CM

## 2014-09-20 DIAGNOSIS — R52 Pain, unspecified: Secondary | ICD-10-CM

## 2014-09-20 LAB — POCT INFLUENZA A/B
INFLUENZA B, POC: NEGATIVE
Influenza A, POC: NEGATIVE

## 2014-09-20 MED ORDER — BECLOMETHASONE DIPROPIONATE 40 MCG/ACT IN AERS: 2.0000 | INHALATION_SPRAY | Freq: Two times a day (BID) | RESPIRATORY_TRACT | Status: DC

## 2014-09-20 MED ORDER — BENZONATATE 200 MG PO CAPS: 200.0000 mg | ORAL_CAPSULE | Freq: Three times a day (TID) | ORAL | Status: DC | PRN

## 2014-09-20 MED ORDER — ALBUTEROL SULFATE HFA 108 (90 BASE) MCG/ACT IN AERS: 2.0000 | INHALATION_SPRAY | Freq: Four times a day (QID) | RESPIRATORY_TRACT | Status: DC | PRN

## 2014-09-20 MED ORDER — AZITHROMYCIN 250 MG PO TABS: ORAL_TABLET | ORAL | Status: DC

## 2014-09-20 MED ORDER — FLUTICASONE PROPIONATE 50 MCG/ACT NA SUSP: 2.0000 | Freq: Every day | NASAL | Status: DC

## 2014-09-20 NOTE — Patient Instructions (Signed)
Acute bronchitis Appear to have secondary bacterial  infection 6 days post flu onset. Flu test negative but likely false negative in light of clinical presentation and husband + flu test.  Tamiflu tx window missed. Will rx azithromycin antibiotic, benzonatate for cough, flonase spray, qvar inhaler and albuterol inhaler.  If symptoms persisting by early next week then consider cxr and cbc.  Follow up in 7 days or as needed.

## 2014-09-20 NOTE — Assessment & Plan Note (Signed)
Appear to have secondary bacterial  infection 6 days post flu onset. Flu test negative but likely false negative in light of clinical presentation and husband + flu test.  Tamiflu tx window missed. Will rx azithromycin antibiotic, benzonatate for cough, flonase spray, qvar inhaler and albuterol inhaler.  If symptoms persisting by early next week then consider cxr and cbc.  Follow up in 7 days or as needed.

## 2014-09-20 NOTE — Progress Notes (Signed)
Subjective:    Patient ID: Ann Barton, female    DOB: 03/14/1956, 59 y.o.   MRN: [id number]  HPI   Pt in with feeling sick since last Saturday. So 6 days of congestion, cough, ha, fatigued and wiped out. Pt husband pt husband flu +. On Saturday she had aches all over. Pt states aches are getting better. Energy not improving yet. Early in week fever and chill. Since yesterday no fever.   Pt has some productive cough. Some wheezing. No asthma. No smoking hx.  Pt missed 2 days already. She will miss until Monday. Total of 5 days.    Review of Systems  Constitutional: Positive for fever, chills and fatigue.  HENT: Positive for congestion. Negative for sinus pressure and sore throat.   Respiratory: Positive for cough and wheezing. Negative for chest tightness and shortness of breath.   Cardiovascular: Negative for chest pain and palpitations.  Gastrointestinal: Negative for abdominal pain.  Musculoskeletal: Positive for myalgias. Negative for back pain.  Neurological: Positive for headaches.  Hematological: Negative for adenopathy. Does not bruise/bleed easily.  Psychiatric/Behavioral: Negative for behavioral problems.   Past Medical History  Diagnosis Date  . Chicken pox as a child  . Mumps as a child  . Measles as a child  . Arthritis     left hip, low back  . Allergy     seasonal  . Hypertension   . 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 Goiter   . Pharyngitis, acute 03/09/2011  . Neutropenia 03/10/2011  . Urinary frequency 04/27/2011  . Vaginitis 04/27/2011  . Cough 04/19/2012  . Atrophic vaginitis 01/25/2011  . Bladder prolapse, female, acquired 05/16/2013  . Other and unspecified hyperlipidemia 08/23/2013  . Other malaise and fatigue 08/26/2013    History   Social History  . Marital Status: Unknown    Spouse Name: N/A  . Number of Children: 2  . Years of Education: N/A   Occupational History  . [employer]   Social History Main Topics  . Smoking status:  Former Smoker -- 1.00 packs/day for 5 years    Types: Cigarettes    Quit date: 07/12/1976  . Smokeless tobacco: Never Used  . Alcohol Use: Yes     Comment: occasionally  . Drug Use: No  . Sexual Activity:    Partners: Male     Comment: no dietary restrictions. but does minimize refined sugar   Other Topics Concern  . Not on file   Social History Narrative    Past Surgical History  Procedure Laterality Date  . Cholecystectomy  1998  . Tonsillectomy  as a child  . Laser surgery on vericose veins      b/l  . In grown toenails removed      partial, b/l  . Hernia repair Right     right, inguinal  . Vaginal hysterectomy  1988    done for polapse  . Biopsy thyroid      FNA, benign  . Breast biopsy Left     FNA, benign  . Nasal septum surgery  2015    Family History  Problem Relation Age of Onset  . Atrial fibrillation Mother   . Obesity Mother   . Diabetes Mother 46    Type 2  . Arthritis Mother     in right knee  . Heart disease Mother     atrial fibrillation, CHF  . Hernia Mother     never recovered from repair  . Hypertension  Father   . Ulcers Father   . Pulmonary fibrosis Father   . Diabetes Maternal Grandmother     type 2?  . Heart attack Maternal Grandmother   . Hearing loss Maternal Grandmother     MI  . Parkinsonism Maternal Grandfather   . Stroke Paternal Grandmother     Allergies  Allergen Reactions  . Lisinopril Cough    Current Outpatient Prescriptions on File Prior to Visit  Medication Sig Dispense Refill  . Biotin 5000 MCG CAPS Take 1 capsule by mouth daily.     . chlorthalidone (HYGROTON) 25 MG tablet Take 1 tablet (25 mg total) by mouth daily. 90 tablet 1  . Cholecalciferol (VITAMIN D3) 5000 UNITS CAPS Take 1 capsule by mouth daily.     . Coenzyme Q10 (COQ-10) 200 MG CAPS Take 1 tablet by mouth daily.     . diphenhydrAMINE (BENADRYL) 25 mg capsule Take 25 mg by mouth as needed for itching or sleep.    Marland Kitchen ibuprofen (ADVIL,MOTRIN) 200 MG  tablet Take 200 mg by mouth every 6 (six) hours as needed for pain.    . Multiple Vitamin (MULTIVITAMIN) tablet Take 1 tablet by mouth daily.      . Probiotic Product (PROBIOTIC DAILY PO) Take 1 tablet by mouth daily.     . sodium chloride (AYR) 0.65 % nasal spray Place 2 sprays into the nose as needed for congestion (bid and prn). 30 mL 12   No current facility-administered medications on file prior to visit.    BP 154/95 mmHg  Pulse 103  Temp(Src) 98.2 F (36.8 C) (Oral)  Ht 5' 8.5" (1.74 m)  Wt 165 lb 6.4 oz (75.025 kg)  BMI 24.78 kg/m2  SpO2 96%      Objective:   Physical Exam  General  Mental Status - Alert. General Appearance - Well groomed. Not in acute distress.  Skin Rashes- No Rashes.  HEENT Head- Normal. Ear Auditory Canal - Left- Normal. Right - Normal.Tympanic Membrane- Left- Normal. Right- Normal. Eye Sclera/Conjunctiva- Left- Normal. Right- Normal. Nose & Sinuses Nasal Mucosa- Left-  Not boggy or Congested. Right-  Not  boggy or Congested. Mouth & Throat Lips: Upper Lip- Normal: no dryness, cracking, pallor, cyanosis, or vesicular eruption. Lower Lip-Normal: no dryness, cracking, pallor, cyanosis or vesicular eruption. Buccal Mucosa- Bilateral- No Aphthous ulcers. Oropharynx- No Discharge or Erythema. +pnd Tonsils: Characteristics- Bilateral- No Erythema or Congestion. Size/Enlargement- Bilateral- No enlargement. Discharge- bilateral-None.  Neck Neck- Supple. No Masses.no lymphadenopathy.   Chest and Lung Exam Auscultation: Breath Sounds:- even and unlabored, but bilateral upper lobe rhonchi.  Cardiovascular Auscultation:Rythm- Regular, rate and rhythm. Murmurs & Other Heart Sounds:Ausculatation of the heart reveal- No Murmurs.  Lymphatic Head & Neck General Head & Neck Lymphatics: Bilateral: Description- No Localized lymphadenopathy.       Assessment & Plan:

## 2014-09-20 NOTE — Progress Notes (Signed)
Pre visit review using our clinic review tool, if applicable. No additional management support is needed unless otherwise documented below in the visit note. 

## 2014-09-23 ENCOUNTER — Telehealth: Payer: Self-pay | Admitting: *Deleted

## 2014-09-23 ENCOUNTER — Encounter: Payer: Self-pay | Admitting: *Deleted

## 2014-09-23 NOTE — Telephone Encounter (Signed)
Received FMLA paperwork via fax from [employer]. Sent pt a MyChart message inquiring about the reason and dates. Forms will be completed when info is received. JG//CMA

## 2014-09-24 NOTE — Telephone Encounter (Signed)
Pt responded what the need for FMLA was. Forms filled out and forwarded to [facility]. JG//CMA

## 2014-09-26 ENCOUNTER — Encounter: Payer: Self-pay | Admitting: Medical

## 2014-09-26 DIAGNOSIS — Z7689 Persons encountering health services in other specified circumstances: Secondary | ICD-10-CM

## 2014-10-01 NOTE — Telephone Encounter (Signed)
Work note

## 2014-10-01 NOTE — Telephone Encounter (Signed)
I signed her paper work today. Then got note stating she needs to have no restrictions. Her paperwork is presently in folder to be picked up. Will send message to [employer] asking her to check or for [facility] To check.

## 2014-10-01 NOTE — Telephone Encounter (Signed)
Patient is wanting to know if this has been faxed back to [employer]?   Patient states that he work note needs to state "no resctrictions". Needs new work note written

## 2014-10-01 NOTE — Telephone Encounter (Signed)
The forms are currently in Ann Barton's red folder

## 2014-10-02 NOTE — Telephone Encounter (Signed)
Forms completed. Sent pt a MyChart message informing her that forms have been faxed to [employer]. Copy sent for scanning. JG//CMA

## 2014-10-02 NOTE — Telephone Encounter (Signed)
Letter left at front desk. Left message on patients answering machine regarding this.

## 2014-11-19 ENCOUNTER — Telehealth: Payer: Self-pay | Admitting: Family Medicine

## 2014-11-19 NOTE — Telephone Encounter (Signed)
Pre Visit letter sent  °

## 2014-12-10 ENCOUNTER — Encounter: Payer: 59 | Admitting: Family Medicine

## 2016-01-20 DIAGNOSIS — J301 Allergic rhinitis due to pollen: Secondary | ICD-10-CM | POA: Diagnosis not present

## 2016-01-20 DIAGNOSIS — J329 Chronic sinusitis, unspecified: Secondary | ICD-10-CM | POA: Diagnosis not present

## 2016-03-17 DIAGNOSIS — H5203 Hypermetropia, bilateral: Secondary | ICD-10-CM | POA: Diagnosis not present

## 2016-09-01 ENCOUNTER — Ambulatory Visit (INDEPENDENT_AMBULATORY_CARE_PROVIDER_SITE_OTHER): Payer: 59 | Admitting: Podiatry

## 2016-09-01 ENCOUNTER — Encounter: Payer: Self-pay | Admitting: Podiatry

## 2016-09-01 DIAGNOSIS — M79675 Pain in left toe(s): Secondary | ICD-10-CM

## 2016-09-01 DIAGNOSIS — A499 Bacterial infection, unspecified: Secondary | ICD-10-CM | POA: Diagnosis not present

## 2016-09-01 DIAGNOSIS — L609 Nail disorder, unspecified: Secondary | ICD-10-CM | POA: Diagnosis not present

## 2016-09-01 DIAGNOSIS — L600 Ingrowing nail: Secondary | ICD-10-CM | POA: Diagnosis not present

## 2016-09-01 DIAGNOSIS — B351 Tinea unguium: Secondary | ICD-10-CM | POA: Diagnosis not present

## 2016-09-01 DIAGNOSIS — L603 Nail dystrophy: Secondary | ICD-10-CM | POA: Diagnosis not present

## 2016-09-01 NOTE — Progress Notes (Signed)
   Subjective:    Patient ID: Ann Barton, female    DOB: May 09, 1956, 61 y.o.   MRN: [id number]  HPI this patient presents to the office with chief complaint of an occasionally painful big toenail on her left foot. She says that 3-4 years ago. [facility] borders. She also had surgery for the removal of the nail borders of the right great toe which has healed. The left great toenail has become thick disfigured and discolored and the nail is unattached to the nailbed. She denies any drainage, but pain is noted on occasion. She presents the office today stating she is provided no self treatment nor sought any professional help. Patient presents for  treatment of this condition.    Review of Systems  HENT: Positive for tinnitus.        Sinus problems       Objective:   Physical Exam   Podiatric Exam: Vascular: dorsalis pedis and posterior tibial pulses are palpable bilateral. Capillary return is immediate. Temperature gradient is WNL. Skin turgor WNL  Sensorium: Normal Semmes Weinstein monofilament test. Normal tactile sensation bilaterally. Nail Exam: Pt has thick disfigured discolored nails with subungual debris noted  Left hallux.  Distal aspect nail plate is unattached from nail bed.    Medial and lateral borders right great toenail were surgically corrected.                                                                                 Ulcer Exam: There is no evidence of ulcer or pre-ulcerative changes or infection. Orthopedic Exam: Muscle tone and strength are WNL. No limitations in general ROM. No crepitus or effusions noted. Foot type and digits show no abnormalities. Bony prominences are unremarkable. Skin: No Porokeratosis. No infection or ulcer           Assessment & Plan:  Onychomycosis  Left hallux  IE  Debridement of nail left hallux.  Nail sample to be sent to the lab.  RTC 4 weeks. Discussed treatment options  with this patient.   Gardiner Barefoot DPM

## 2016-09-02 ENCOUNTER — Ambulatory Visit: Payer: 59 | Admitting: Podiatry

## 2017-01-18 DIAGNOSIS — G4733 Obstructive sleep apnea (adult) (pediatric): Secondary | ICD-10-CM | POA: Diagnosis not present

## 2017-01-18 DIAGNOSIS — H90A12 Conductive hearing loss, unilateral, left ear with restricted hearing on the contralateral side: Secondary | ICD-10-CM | POA: Diagnosis not present

## 2017-01-18 DIAGNOSIS — H6980 Other specified disorders of Eustachian tube, unspecified ear: Secondary | ICD-10-CM | POA: Diagnosis not present

## 2017-01-18 DIAGNOSIS — J301 Allergic rhinitis due to pollen: Secondary | ICD-10-CM | POA: Diagnosis not present

## 2017-01-18 DIAGNOSIS — J312 Chronic pharyngitis: Secondary | ICD-10-CM | POA: Diagnosis not present

## 2017-02-01 DIAGNOSIS — G4733 Obstructive sleep apnea (adult) (pediatric): Secondary | ICD-10-CM | POA: Diagnosis not present

## 2017-02-22 DIAGNOSIS — G4733 Obstructive sleep apnea (adult) (pediatric): Secondary | ICD-10-CM | POA: Diagnosis not present

## 2017-05-05 DIAGNOSIS — H5203 Hypermetropia, bilateral: Secondary | ICD-10-CM | POA: Diagnosis not present

## 2017-05-19 ENCOUNTER — Encounter: Payer: Self-pay | Admitting: Family Medicine

## 2017-05-19 ENCOUNTER — Ambulatory Visit (INDEPENDENT_AMBULATORY_CARE_PROVIDER_SITE_OTHER): Payer: 59 | Admitting: Family Medicine

## 2017-05-19 VITALS — BP 180/110 | HR 89 | Temp 98.3°F | Resp 18 | Ht 69.0 in | Wt 169.8 lb

## 2017-05-19 DIAGNOSIS — I10 Essential (primary) hypertension: Secondary | ICD-10-CM | POA: Diagnosis not present

## 2017-05-19 DIAGNOSIS — D709 Neutropenia, unspecified: Secondary | ICD-10-CM

## 2017-05-19 DIAGNOSIS — Z0000 Encounter for general adult medical examination without abnormal findings: Secondary | ICD-10-CM | POA: Diagnosis not present

## 2017-05-19 DIAGNOSIS — E7800 Pure hypercholesterolemia, unspecified: Secondary | ICD-10-CM | POA: Diagnosis not present

## 2017-05-19 DIAGNOSIS — E785 Hyperlipidemia, unspecified: Secondary | ICD-10-CM

## 2017-05-19 DIAGNOSIS — Z79899 Other long term (current) drug therapy: Secondary | ICD-10-CM

## 2017-05-19 DIAGNOSIS — Z91018 Allergy to other foods: Secondary | ICD-10-CM | POA: Insufficient documentation

## 2017-05-19 DIAGNOSIS — F5109 Other insomnia not due to a substance or known physiological condition: Secondary | ICD-10-CM

## 2017-05-19 LAB — COMPREHENSIVE METABOLIC PANEL
ALT: 13 U/L (ref 0–35)
AST: 16 U/L (ref 0–37)
Albumin: 4.2 g/dL (ref 3.5–5.2)
Alkaline Phosphatase: 59 U/L (ref 39–117)
BUN: 11 mg/dL (ref 6–23)
CHLORIDE: 104 meq/L (ref 96–112)
CO2: 32 mEq/L (ref 19–32)
Calcium: 9.7 mg/dL (ref 8.4–10.5)
Creatinine, Ser: 0.65 mg/dL (ref 0.40–1.20)
GFR: 98.16 mL/min (ref >60.00)
GLUCOSE: 101 mg/dL — AB (ref 70–99)
POTASSIUM: 3.7 meq/L (ref 3.5–5.1)
SODIUM: 141 meq/L (ref 135–145)
Total Bilirubin: 0.3 mg/dL (ref 0.2–1.2)
Total Protein: 7.2 g/dL (ref 6.0–8.3)

## 2017-05-19 LAB — CBC
HEMATOCRIT: 40.3 % (ref 36.0–46.0)
Hemoglobin: 13.3 g/dL (ref 12.0–15.0)
MCHC: 33.1 g/dL (ref 30.0–36.0)
MCV: 88.4 fl (ref 78.0–100.0)
Platelets: 227 10*3/uL (ref 150.0–400.0)
RBC: 4.55 Mil/uL (ref 3.87–5.11)
RDW: 13.4 % (ref 11.5–15.5)
WBC: 6.2 10*3/uL (ref 4.0–10.5)

## 2017-05-19 LAB — LIPID PANEL
CHOLESTEROL: 186 mg/dL (ref 0–200)
HDL: 43.8 mg/dL (ref >39.00)
LDL CALC: 103 mg/dL — AB (ref 0–99)
NonHDL: 142.02
Total CHOL/HDL Ratio: 4
Triglycerides: 195 mg/dL — ABNORMAL HIGH (ref 0.0–149.0)
VLDL: 39 mg/dL (ref 0.0–40.0)

## 2017-05-19 LAB — TSH: TSH: 0.87 u[IU]/mL (ref 0.35–4.50)

## 2017-05-19 MED ORDER — METOPROLOL SUCCINATE ER 50 MG PO TB24: 50.0000 mg | ORAL_TABLET | Freq: Every day | ORAL | 1 refills | Status: DC

## 2017-05-19 MED ORDER — ZOLPIDEM TARTRATE 10 MG PO TABS: 10.0000 mg | ORAL_TABLET | Freq: Every evening | ORAL | 1 refills | Status: DC | PRN

## 2017-05-19 MED FILL — ZOLPIDEM TARTRATE 10 MG TAB: 10 | 15 days supply | Qty: 15 | Fill #0

## 2017-05-19 MED FILL — METOPROLOL SUCC ER 50 MG TA: 50 | 30 days supply | Qty: 30 | Fill #0

## 2017-05-19 NOTE — Assessment & Plan Note (Signed)
Poorly controlled will alter medications, encouraged DASH diet, minimize caffeine and obtain adequate sleep. Report concerning symptoms and follow up as directed and as needed. Add Metoprolol XL 50 mg daily

## 2017-05-19 NOTE — Patient Instructions (Addendum)
Shingrix is the new shingles, no 2 shots over 2-6 months   Bring a copy of your advanced directives  Preventive Care 40-64 Years, Female Preventive care refers to lifestyle choices and visits with your health care provider that can promote health and wellness. What does preventive care include?  A yearly physical exam. This is also called an annual well check.  Dental exams once or twice a year.  Routine eye exams. Ask your health care provider how often you should have your eyes checked.  Personal lifestyle choices, including: ? Daily care of your teeth and gums. ? Regular physical activity. ? Eating a healthy diet. ? Avoiding tobacco and drug use. ? Limiting alcohol use. ? Practicing safe sex. ? Taking low-dose aspirin daily starting at age 75. ? Taking vitamin and mineral supplements as recommended by your health care provider. What happens during an annual well check? The services and screenings done by your health care provider during your annual well check will depend on your age, overall health, lifestyle risk factors, and family history of disease. Counseling Your health care provider may ask you questions about your:  Alcohol use.  Tobacco use.  Drug use.  Emotional well-being.  Home and relationship well-being.  Sexual activity.  Eating habits.  Work and work Statistician.  Method of birth control.  Menstrual cycle.  Pregnancy history.  Screening You may have the following tests or measurements:  Height, weight, and BMI.  Blood pressure.  Lipid and cholesterol levels. These may be checked every 5 years, or more frequently if you are over 65 years old.  Skin check.  Lung cancer screening. You may have this screening every year starting at age 27 if you have a 30-pack-year history of smoking and currently smoke or have quit within the past 15 years.  Fecal occult blood test (FOBT) of the stool. You may have this test every year starting at age  46.  Flexible sigmoidoscopy or colonoscopy. You may have a sigmoidoscopy every 5 years or a colonoscopy every 10 years starting at age 69.  Hepatitis C blood test.  Hepatitis B blood test.  Sexually transmitted disease (STD) testing.  Diabetes screening. This is done by checking your blood sugar (glucose) after you have not eaten for a while (fasting). You may have this done every 1-3 years.  Mammogram. This may be done every 1-2 years. Talk to your health care provider about when you should start having regular mammograms. This may depend on whether you have a family history of breast cancer.  BRCA-related cancer screening. This may be done if you have a family history of breast, ovarian, tubal, or peritoneal cancers.  Pelvic exam and Pap test. This may be done every 3 years starting at age 79. Starting at age 29, this may be done every 5 years if you have a Pap test in combination with an HPV test.  Bone density scan. This is done to screen for osteoporosis. You may have this scan if you are at high risk for osteoporosis.  Discuss your test results, treatment options, and if necessary, the need for more tests with your health care provider. Vaccines Your health care provider may recommend certain vaccines, such as:  Influenza vaccine. This is recommended every year.  Tetanus, diphtheria, and acellular pertussis (Tdap, Td) vaccine. You may need a Td booster every 10 years.  Varicella vaccine. You may need this if you have not been vaccinated.  Zoster vaccine. You may need this after age 54.  Measles, mumps, and rubella (MMR) vaccine. You may need at least one dose of MMR if you were born in 1957 or later. You may also need a second dose.  Pneumococcal 13-valent conjugate (PCV13) vaccine. You may need this if you have certain conditions and were not previously vaccinated.  Pneumococcal polysaccharide (PPSV23) vaccine. You may need one or two doses if you smoke cigarettes or if you  have certain conditions.  Meningococcal vaccine. You may need this if you have certain conditions.  Hepatitis A vaccine. You may need this if you have certain conditions or if you travel or work in places where you may be exposed to hepatitis A.  Hepatitis B vaccine. You may need this if you have certain conditions or if you travel or work in places where you may be exposed to hepatitis B.  Haemophilus influenzae type b (Hib) vaccine. You may need this if you have certain conditions.  Talk to your health care provider about which screenings and vaccines you need and how often you need them. This information is not intended to replace advice given to you by your health care provider. Make sure you discuss any questions you have with your health care provider. Document Released: 07/25/2015 Document Revised: 03/17/2016 Document Reviewed: 04/29/2015 Elsevier Interactive Patient Education  2017 [employer].

## 2017-05-19 NOTE — Progress Notes (Signed)
Subjective:  I acted as a Education administrator for Dr. Charlett Blake. [place], [place]  Patient ID: Ann Barton, female    DOB: 03/04/1956, 61 y.o.   MRN: [id number]  No chief complaint on file.   HPI  Patient is in today for an annual exam and follow up on chronic medical concerns including hypertension and hyperlipidemia. No recent febrile illness or acute hospitalizations. He notes her blood pressure has been in the 140s over 80s to 90s. Denies CP/palp/SOB/HA/congestion/fevers/GI or GU c/o. Taking meds as prescribed. She is working some night shifts and has trouble sleeping during the day and adjusting back to nights. She is staying active and trying to eat a heart healthy diet.  Patient Care Team: Mosie Lukes, MD as PCP - General (Family Medicine)   Past Medical History:  Diagnosis Date  . Allergy    seasonal  . Arthritis    left hip, low back  . Atrophic vaginitis 01/25/2011  . Bladder prolapse, female, acquired 05/16/2013  . Chicken pox as a child  . Cough 04/19/2012  . 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 Goiter   . Hypertension   . Measles as a child  . Mumps as a child  . Neutropenia 03/10/2011  . Other and unspecified hyperlipidemia 08/23/2013  . Other malaise and fatigue 08/26/2013  . Pharyngitis, acute 03/09/2011  . Urinary frequency 04/27/2011  . Vaginitis 04/27/2011    Past Surgical History:  Procedure Laterality Date  . BIOPSY THYROID     FNA, benign  . BREAST BIOPSY Left    FNA, benign  . CHOLECYSTECTOMY  1998  . HERNIA REPAIR Right    right, inguinal  . in grown toenails removed     partial, b/l  . laser surgery on vericose veins     b/l  . NASAL SEPTUM SURGERY  2015  . TONSILLECTOMY  as a child  . VAGINAL HYSTERECTOMY  1988   done for polapse    Family History  Problem Relation Age of Onset  . Atrial fibrillation Mother   . Obesity Mother   . Diabetes Mother 67       Type 2  . Arthritis Mother        in right knee  . Heart disease Mother        atrial  fibrillation, CHF  . Hernia Mother        never recovered from repair  . Hypertension Father   . Ulcers Father   . Pulmonary fibrosis Father   . Diabetes Maternal Grandmother        type 2?  . Heart attack Maternal Grandmother   . Hearing loss Maternal Grandmother        MI  . Parkinsonism Maternal Grandfather   . Stroke Paternal Grandmother     Social History   Socioeconomic History  . Marital status: Unknown    Spouse name: Not on file  . Number of children: 2  . Years of education: Not on file  . Highest education level: Not on file  Social Needs  . Financial resource strain: Not on file  . Food insecurity - worry: Not on file  . Food insecurity - inability: Not on file  . Transportation needs - medical: Not on file  . Transportation needs - non-medical: Not on file  Occupational History  . Occupation: NICU    Employer:   Tobacco Use  . Smoking status: Former Smoker    Packs/day: 1.00    Years: 5.00  Pack years: 5.00    Types: Cigarettes    Last attempt to quit: 07/12/1976    Years since quitting: 40.8  . Smokeless tobacco: Never Used  Substance and Sexual Activity  . Alcohol use: Yes    Comment: occasionally  . Drug use: No  . Sexual activity: Yes    Partners: Male    Comment: no dietary restrictions. but does minimize refined sugar  Other Topics Concern  . Not on file  Social History Narrative  . Not on file    Outpatient Medications Prior to Visit  Medication Sig Dispense Refill  . Cholecalciferol (VITAMIN D3) 5000 UNITS CAPS Take 1 capsule by mouth daily.     . Coenzyme Q10 (COQ-10) 200 MG CAPS Take 1 tablet by mouth daily.     . diphenhydrAMINE (BENADRYL) 25 mg capsule Take 25 mg by mouth as needed for itching or sleep.    Marland Kitchen HAWTHORN PO Take 500 mg by mouth daily.    Marland Kitchen Ketotifen Fumarate (ALLERGY EYE DROPS OP) Apply to eye. 3 gtts,daily,SL    . Multiple Vitamin (MULTIVITAMIN) tablet Take 1 tablet by mouth daily.      . Nutritional  Supplements (GRAPESEED EXTRACT PO) Take by mouth daily.    . Probiotic Product (PROBIOTIC DAILY PO) Take 1 tablet by mouth daily.     Marland Kitchen albuterol (PROVENTIL HFA;VENTOLIN HFA) 108 (90 BASE) MCG/ACT inhaler Inhale 2 puffs into the lungs every 6 (six) hours as needed for wheezing or shortness of breath. 1 Inhaler 0  . azithromycin (ZITHROMAX) 250 MG tablet Take 2 tablets by mouth on day 1, followed by 1 tablet by mouth daily for 4 days. (Patient not taking: Reported on 09/01/2016) 6 tablet 0  . beclomethasone (QVAR) 40 MCG/ACT inhaler Inhale 2 puffs into the lungs 2 (two) times daily at 10 AM and 5 PM. (Patient not taking: Reported on 09/01/2016) 1 Inhaler 1  . benzonatate (TESSALON) 200 MG capsule Take 1 capsule (200 mg total) by mouth 3 (three) times daily as needed for cough. (Patient not taking: Reported on 09/01/2016) 20 capsule 0  . Biotin 5000 MCG CAPS Take 1 capsule by mouth daily.     . chlorthalidone (HYGROTON) 25 MG tablet Take 1 tablet (25 mg total) by mouth daily. (Patient not taking: Reported on 09/01/2016) 90 tablet 1  . fluticasone (FLONASE) 50 MCG/ACT nasal spray Place 2 sprays into both nostrils daily. (Patient not taking: Reported on 09/01/2016) 16 g 1  . ibuprofen (ADVIL,MOTRIN) 200 MG tablet Take 200 mg by mouth every 6 (six) hours as needed for pain.    . Magnesium 400 MG TABS Take by mouth daily.    . sodium chloride (AYR) 0.65 % nasal spray Place 2 sprays into the nose as needed for congestion (bid and prn). (Patient not taking: Reported on 09/01/2016) 30 mL 12   No facility-administered medications prior to visit.     Allergies  Allergen Reactions  . Lisinopril Cough  . Seasonal Ic [Cholestatin]     Review of Systems  Constitutional: Negative for fever and malaise/fatigue.  HENT: Negative for congestion.   Eyes: Negative for blurred vision.  Respiratory: Negative for cough and shortness of breath.   Cardiovascular: Negative for chest pain, palpitations and leg swelling.    Gastrointestinal: Negative for vomiting.  Musculoskeletal: Negative for back pain.  Skin: Negative for rash.  Neurological: Negative for loss of consciousness and headaches.  Psychiatric/Behavioral: The patient has insomnia.        Objective:  Physical Exam  Constitutional: She is oriented to person, place, and time. She appears well-developed and well-nourished. No distress.  HENT:  Head: Normocephalic and atraumatic.  Eyes: Conjunctivae are normal.  Neck: Normal range of motion. No thyromegaly present.  Cardiovascular: Normal rate and regular rhythm.  Pulmonary/Chest: Effort normal and breath sounds normal. She has no wheezes.  Abdominal: Soft. Bowel sounds are normal. There is no tenderness.  Musculoskeletal: Normal range of motion. She exhibits no edema or deformity.  Neurological: She is alert and oriented to person, place, and time.  Skin: Skin is warm and dry. She is not diaphoretic.  Psychiatric: She has a normal mood and affect.    BP (!) 180/110 (BP Location: Left Arm, Patient Position: Sitting, Cuff Size: Normal)   Pulse 89   Temp 98.3 F (36.8 C) (Oral)   Resp 18   Ht 5\' 9"  (1.753 m)   Wt 169 lb 12.8 oz (77 kg)   SpO2 98%   BMI 25.08 kg/m  Wt Readings from Last 3 Encounters:  05/19/17 169 lb 12.8 oz (77 kg)  09/20/14 165 lb 6.4 oz (75 kg)  06/04/14 175 lb (79.4 kg)   BP Readings from Last 3 Encounters:  05/19/17 (!) 180/110  09/20/14 (!) 154/95  06/09/14 (!) 144/80     Immunization History  Administered Date(s) Administered  . Influenza Split 04/11/2012  . Influenza-Unspecified 04/11/2013, 04/11/2014  . Pneumococcal Conjugate-13 05/14/2013  . Tdap 05/14/2013    Health Maintenance  Topic Date Due  . Hepatitis C Screening  [birth year]  . HIV Screening  05/17/1970  . COLONOSCOPY  05/17/2005  . MAMMOGRAM  10/05/2013  . TETANUS/TDAP  05/15/2023  . INFLUENZA VACCINE  Completed    Lab Results  Component Value Date   WBC 6.2 05/19/2017   HGB  13.3 05/19/2017   HCT 40.3 05/19/2017   PLT 227.0 05/19/2017   GLUCOSE 101 (H) 05/19/2017   CHOL 186 05/19/2017   TRIG 195.0 (H) 05/19/2017   HDL 43.80 05/19/2017   LDLCALC 103 (H) 05/19/2017   ALT 13 05/19/2017   AST 16 05/19/2017   NA 141 05/19/2017   K 3.7 05/19/2017   CL 104 05/19/2017   CREATININE 0.65 05/19/2017   BUN 11 05/19/2017   CO2 32 05/19/2017   TSH 0.87 05/19/2017    Lab Results  Component Value Date   TSH 0.87 05/19/2017   Lab Results  Component Value Date   WBC 6.2 05/19/2017   HGB 13.3 05/19/2017   HCT 40.3 05/19/2017   MCV 88.4 05/19/2017   PLT 227.0 05/19/2017   Lab Results  Component Value Date   NA 141 05/19/2017   K 3.7 05/19/2017   CO2 32 05/19/2017   GLUCOSE 101 (H) 05/19/2017   BUN 11 05/19/2017   CREATININE 0.65 05/19/2017   BILITOT 0.3 05/19/2017   ALKPHOS 59 05/19/2017   AST 16 05/19/2017   ALT 13 05/19/2017   PROT 7.2 05/19/2017   ALBUMIN 4.2 05/19/2017   CALCIUM 9.7 05/19/2017   GFR 98.16 05/19/2017   Lab Results  Component Value Date   CHOL 186 05/19/2017   Lab Results  Component Value Date   HDL 43.80 05/19/2017   Lab Results  Component Value Date   LDLCALC 103 (H) 05/19/2017   Lab Results  Component Value Date   TRIG 195.0 (H) 05/19/2017   Lab Results  Component Value Date   CHOLHDL 4 05/19/2017   No results found for: HGBA1C  Assessment & Plan:   Problem List Items Addressed This Visit    Hypertension    Poorly controlled will alter medications, encouraged DASH diet, minimize caffeine and obtain adequate sleep. Report concerning symptoms and follow up as directed and as needed. Add Metoprolol XL 50 mg daily      Relevant Medications   metoprolol succinate (TOPROL-XL) 50 MG 24 hr tablet   Other Relevant Orders   CBC (Completed)   Comprehensive metabolic panel (Completed)   TSH (Completed)   High cholesterol    Encouraged heart healthy diet, increase exercise, avoid trans fats, consider a  krill oil cap daily      Relevant Medications   metoprolol succinate (TOPROL-XL) 50 MG 24 hr tablet   Preventative health care    Patient encouraged to maintain heart healthy diet, regular exercise, adequate sleep. Consider daily probiotics. Take medications as prescribed. Labs reviewed.       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 She is given an rx for Zolpidem to use prn. Encouraged good sleep hygiene such as dark, quiet room. No blue/green glowing lights such as computer screens in bedroom. No alcohol or stimulants in evening. Cut down on caffeine as able. Regular exercise is helpful but not just prior to bed time.        Other Visit Diagnoses    Hyperlipidemia, unspecified hyperlipidemia type    -  Primary   Relevant Medications   metoprolol succinate (TOPROL-XL) 50 MG 24 hr tablet   Other Relevant Orders   Lipid panel (Completed)   High risk medication use       Relevant Orders   Pain Mgmt, Profile 8 w/Conf, U (Completed)      I have discontinued Sage L. Yamaguchi's Biotin, sodium chloride, ibuprofen, chlorthalidone, azithromycin, benzonatate, fluticasone, beclomethasone, albuterol, and Magnesium. I am also having her start on zolpidem and metoprolol succinate. Additionally, I am having her maintain her multivitamin, Vitamin D3, Probiotic Product (PROBIOTIC DAILY PO), CoQ-10, diphenhydrAMINE, HAWTHORN PO, Nutritional Supplements (GRAPESEED EXTRACT PO), and Ketotifen Fumarate (ALLERGY EYE DROPS OP).  Meds ordered this encounter  Medications  . zolpidem (AMBIEN) 10 MG tablet    Sig: Take 1 tablet (10 mg total) at bedtime as needed by mouth for sleep.    Dispense:  15 tablet    Refill:  1  . metoprolol succinate (TOPROL-XL) 50 MG 24 hr tablet    Sig: Take 1 tablet (50 mg total) daily by mouth. Take with or immediately following a  meal.    Dispense:  30 tablet    Refill:  1    CMA served as scribe during this visit. History, Physical and Plan performed by medical provider. Documentation and orders reviewed and attested to.  Penni Homans, MD

## 2017-05-19 NOTE — Assessment & Plan Note (Signed)
Check cbc 

## 2017-05-19 NOTE — Assessment & Plan Note (Signed)
Milk, eggs, wheat

## 2017-05-20 LAB — PAIN MGMT, PROFILE 8 W/CONF, U
6 Acetylmorphine: NEGATIVE ng/mL (ref <10)
ALCOHOL METABOLITES: NEGATIVE ng/mL (ref <500)
Amphetamines: NEGATIVE ng/mL (ref <500)
BENZODIAZEPINES: NEGATIVE ng/mL (ref <100)
Buprenorphine, Urine: NEGATIVE ng/mL (ref <5)
COCAINE METABOLITE: NEGATIVE ng/mL (ref <150)
Creatinine: 23.6 mg/dL
MDMA: NEGATIVE ng/mL (ref <500)
Marijuana Metabolite: NEGATIVE ng/mL (ref <20)
Opiates: NEGATIVE ng/mL (ref <100)
Oxidant: NEGATIVE ug/mL (ref <200)
Oxycodone: NEGATIVE ng/mL (ref <100)
PH: 6.74 (ref 4.5–9.0)

## 2017-05-23 DIAGNOSIS — G4700 Insomnia, unspecified: Secondary | ICD-10-CM | POA: Insufficient documentation

## 2017-05-23 NOTE — Assessment & Plan Note (Signed)
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 She is given an rx for Zolpidem to use prn. Encouraged good sleep hygiene such as dark, quiet room. No blue/green glowing lights such as computer screens in bedroom. No alcohol or stimulants in evening. Cut down on caffeine as able. Regular exercise is helpful but not just prior to bed time.

## 2017-05-23 NOTE — Assessment & Plan Note (Signed)
Encouraged heart healthy diet, increase exercise, avoid trans fats, consider a krill oil cap daily 

## 2017-05-23 NOTE — Assessment & Plan Note (Addendum)
Patient encouraged to maintain heart healthy diet, regular exercise, adequate sleep. Consider daily probiotics. Take medications as prescribed. Labs reviewed 

## 2017-06-16 ENCOUNTER — Ambulatory Visit (INDEPENDENT_AMBULATORY_CARE_PROVIDER_SITE_OTHER): Payer: 59 | Admitting: Family Medicine

## 2017-06-16 VITALS — BP 161/98 | HR 67

## 2017-06-16 DIAGNOSIS — I10 Essential (primary) hypertension: Secondary | ICD-10-CM | POA: Diagnosis not present

## 2017-06-16 MED ORDER — METOPROLOL SUCCINATE ER 100 MG PO TB24: 100.0000 mg | ORAL_TABLET | Freq: Every day | ORAL | 3 refills | Status: DC

## 2017-06-16 MED FILL — METOPROLOL SUCC ER 100 MG T: 100 | 90 days supply | Qty: 90 | Fill #0

## 2017-06-16 NOTE — Progress Notes (Signed)
Pre visit review using our clinic tool,if applicable. No additional management support is needed unless otherwise documented below in the visit note.   Patient in for BP check per order from Dr. Frederik Pear dated 05/19/2017.  BP check on last visit = 180/110  P=89  Patient states she has slight headaches at times but usually takes Exedrin and it subsides. Patient stats she has had her BP medication today.  BP = 161/98 P= 67   Per Dr. Charlett Blake patient to increase Metoprolol to 100 mg daily and return in 2 weeks for BP check. Appointment scheduled for patient.

## 2017-06-30 ENCOUNTER — Ambulatory Visit (INDEPENDENT_AMBULATORY_CARE_PROVIDER_SITE_OTHER): Payer: 59

## 2017-06-30 DIAGNOSIS — I10 Essential (primary) hypertension: Secondary | ICD-10-CM | POA: Diagnosis not present

## 2017-06-30 NOTE — Progress Notes (Addendum)
Pre visit review using our clinic tool,if applicable. No additional management support is needed unless otherwise documented below in the visit note.  Patient in for BP check per order from Dr. Frederik Pear.  Patients Metoprolol was increased to 100 mg daily on last visit 06/16/17.  Patient has no complaints this visit. Patient did not take BP med before coming to appointment today.  BP last visit = 161/9   P= 67  BP today = 151/89  P=89  Per Dr. Charlett Blake patient to return in 2 weeks for BP check. Take medications prior to visit. Appointment scheduled.  Nurse blood pressure check note reviewed. Agree with documention and plan.

## 2017-07-21 ENCOUNTER — Ambulatory Visit (INDEPENDENT_AMBULATORY_CARE_PROVIDER_SITE_OTHER): Payer: No Typology Code available for payment source | Admitting: Family Medicine

## 2017-07-21 VITALS — BP 153/92 | HR 59

## 2017-07-21 DIAGNOSIS — I10 Essential (primary) hypertension: Secondary | ICD-10-CM | POA: Diagnosis not present

## 2017-07-21 MED ORDER — TRIAMTERENE-HCTZ 37.5-25 MG PO TABS
1.0000 | ORAL_TABLET | Freq: Every day | ORAL | 3 refills | Status: DC
Start: 2017-07-21 — End: 2020-06-26

## 2017-07-21 MED FILL — TRIAMTERENE-HCTZ 37.5-25 MG: 37.5-25 | 90 days supply | Qty: 90 | Fill #0

## 2017-07-21 MED FILL — ZOLPIDEM TARTRATE 10 MG TAB: 10 | 15 days supply | Qty: 15 | Fill #1

## 2017-07-21 NOTE — Progress Notes (Addendum)
Pre visit review using our clinic tool,if applicable. No additional management support is needed unless otherwise documented below in the visit note.   Patient in for BP check per order from Dr. Charlett Blake. On patients las visit she had not taken BP medication which is Metoprolol which had been increased from 50 mg to 100 mg on the 26th of December.  BP on last visit was 151/89 P= 67  Patient states she has had BP medication today about 1 hour ago.  BP today =  153/92 P =59  Per Dr. Charlett Blake  Patient to add Maxzide- Hctz 37.5-25 mg daily and return for BP check in 3-4 weeks. Appointment scheduled for 08/10/17 @2 :30 pm patient aware  Nurse BP check note reviewed. Agree with documention and plan.

## 2017-08-10 ENCOUNTER — Ambulatory Visit (INDEPENDENT_AMBULATORY_CARE_PROVIDER_SITE_OTHER): Payer: No Typology Code available for payment source | Admitting: Family Medicine

## 2017-08-10 VITALS — BP 123/82 | HR 55

## 2017-08-10 DIAGNOSIS — I10 Essential (primary) hypertension: Secondary | ICD-10-CM | POA: Diagnosis not present

## 2017-08-10 NOTE — Progress Notes (Signed)
Pre visit review using our clinic tool,if applicable. No additional management support is needed unless otherwise documented below in the visit note.   Patient in for BP check per order from Dr. Frederik Pear dated 07/21/2017.  Patient taking Maxzide 37.5-25 mg qd and Metoprolol XL 100 mg qd  Last BP = 153/92 P= 59  BP today = 123/82 P= 55  Per Dr. Lorelei Pont DOD patient  To continue taking medications as ordered and return for scheduled follow up appointment with Dr. Charlett Blake. Patient agreed.

## 2017-09-24 ENCOUNTER — Other Ambulatory Visit: Payer: Self-pay | Admitting: Family Medicine

## 2017-09-26 MED ORDER — ZOLPIDEM TARTRATE 10 MG PO TABS: 10.0000 mg | ORAL_TABLET | Freq: Every evening | ORAL | 1 refills | Status: DC | PRN

## 2017-09-26 MED FILL — METOPROLOL SUCC ER 100 MG T: 100 | 90 days supply | Qty: 90 | Fill #1

## 2017-09-26 NOTE — Telephone Encounter (Signed)
Last RX: 05/19/17, #15 x 1 refill. Last filled 07/21/17, #15 Last OV: 07/21/17 Next OV: 11/17/17 UDS: 05/19/17, low risk. Repeat annually CSC: 05/19/17 CSR: No discrepancies identified

## 2017-09-30 MED FILL — ZOLPIDEM TARTRATE 10 MG TAB: 10 | 15 days supply | Qty: 15 | Fill #0

## 2017-11-17 ENCOUNTER — Encounter: Payer: Self-pay | Admitting: Family Medicine

## 2017-11-17 ENCOUNTER — Ambulatory Visit (INDEPENDENT_AMBULATORY_CARE_PROVIDER_SITE_OTHER): Payer: No Typology Code available for payment source | Admitting: Family Medicine

## 2017-11-17 VITALS — BP 148/80 | HR 61 | Temp 97.6°F | Resp 16 | Ht 68.9 in | Wt 166.4 lb

## 2017-11-17 DIAGNOSIS — I10 Essential (primary) hypertension: Secondary | ICD-10-CM

## 2017-11-17 DIAGNOSIS — E7800 Pure hypercholesterolemia, unspecified: Secondary | ICD-10-CM | POA: Diagnosis not present

## 2017-11-17 DIAGNOSIS — I868 Varicose veins of other specified sites: Secondary | ICD-10-CM

## 2017-11-17 DIAGNOSIS — Z9109 Other allergy status, other than to drugs and biological substances: Secondary | ICD-10-CM | POA: Diagnosis not present

## 2017-11-17 DIAGNOSIS — G4700 Insomnia, unspecified: Secondary | ICD-10-CM

## 2017-11-17 DIAGNOSIS — I8390 Asymptomatic varicose veins of unspecified lower extremity: Secondary | ICD-10-CM

## 2017-11-17 LAB — COMPREHENSIVE METABOLIC PANEL
ALT: 10 U/L (ref 0–35)
AST: 14 U/L (ref 0–37)
Albumin: 4.1 g/dL (ref 3.5–5.2)
Alkaline Phosphatase: 45 U/L (ref 39–117)
BILIRUBIN TOTAL: 0.5 mg/dL (ref 0.2–1.2)
BUN: 11 mg/dL (ref 6–23)
CHLORIDE: 101 meq/L (ref 96–112)
CO2: 31 meq/L (ref 19–32)
Calcium: 9.5 mg/dL (ref 8.4–10.5)
Creatinine, Ser: 0.66 mg/dL (ref 0.40–1.20)
GFR: 96.29 mL/min (ref >60.00)
Glucose, Bld: 94 mg/dL (ref 70–99)
Potassium: 3.9 mEq/L (ref 3.5–5.1)
Sodium: 138 mEq/L (ref 135–145)
Total Protein: 7 g/dL (ref 6.0–8.3)

## 2017-11-17 LAB — CBC
HCT: 36.2 % (ref 36.0–46.0)
Hemoglobin: 12.4 g/dL (ref 12.0–15.0)
MCHC: 34.2 g/dL (ref 30.0–36.0)
MCV: 88.4 fl (ref 78.0–100.0)
Platelets: 246 10*3/uL (ref 150.0–400.0)
RBC: 4.1 Mil/uL (ref 3.87–5.11)
RDW: 13.4 % (ref 11.5–15.5)
WBC: 6 10*3/uL (ref 4.0–10.5)

## 2017-11-17 LAB — LIPID PANEL
CHOL/HDL RATIO: 3
Cholesterol: 149 mg/dL (ref 0–200)
HDL: 48.3 mg/dL (ref >39.00)
LDL CALC: 79 mg/dL (ref 0–99)
NonHDL: 100.26
Triglycerides: 104 mg/dL (ref 0.0–149.0)
VLDL: 20.8 mg/dL (ref 0.0–40.0)

## 2017-11-17 LAB — TSH: TSH: 0.72 u[IU]/mL (ref 0.35–4.50)

## 2017-11-17 MED FILL — TRIAMTERENE-HCTZ 37.5-25 MG: 37.5-25 | 90 days supply | Qty: 90 | Fill #1

## 2017-11-17 NOTE — Assessment & Plan Note (Signed)
Encouraged heart healthy diet, increase exercise, avoid trans fats, consider a krill oil cap daily 

## 2017-11-17 NOTE — Assessment & Plan Note (Signed)
Encouraged good sleep hygiene such as dark, quiet room. No blue/green glowing lights such as computer screens in bedroom. No alcohol or stimulants in evening. Cut down on caffeine as able. Regular exercise is helpful but not just prior to bed time. Ambien is continuing to help despite being widowed in March. She will let us know if she has any new troubles or concerns

## 2017-11-17 NOTE — Patient Instructions (Addendum)
Shingrix is the new shingles shot 2 shots over 2-6 months at pharmacy or at office, check with insurance to confirm coverage.   Have work give you proof of the MMR titer they checked Hypertension Hypertension is another name for high blood pressure. High blood pressure forces your heart to work harder to pump blood. This can cause problems over time. There are two numbers in a blood pressure reading. There is a top number (systolic) over a bottom number (diastolic). It is best to have a blood pressure below 120/80. Healthy choices can help lower your blood pressure. You may need medicine to help lower your blood pressure if:  Your blood pressure cannot be lowered with healthy choices.  Your blood pressure is higher than 130/80.  Follow these instructions at home: Eating and drinking  If directed, follow the DASH eating plan. This diet includes: ? Filling half of your plate at each meal with fruits and vegetables. ? Filling one quarter of your plate at each meal with whole grains. Whole grains include whole wheat pasta, brown rice, and whole grain bread. ? Eating or drinking low-fat dairy products, such as skim milk or low-fat yogurt. ? Filling one quarter of your plate at each meal with low-fat (lean) proteins. Low-fat proteins include fish, skinless chicken, eggs, beans, and tofu. ? Avoiding fatty meat, cured and processed meat, or chicken with skin. ? Avoiding premade or processed food.  Eat less than 1,500 mg of salt (sodium) a day.  Limit alcohol use to no more than 1 drink a day for nonpregnant women and 2 drinks a day for men. One drink equals 12 oz of beer, 5 oz of wine, or 1 oz of hard liquor. Lifestyle  Work with your doctor to stay at a healthy weight or to lose weight. Ask your doctor what the best weight is for you.  Get at least 30 minutes of exercise that causes your heart to beat faster (aerobic exercise) most days of the week. This may include walking, swimming, or  biking.  Get at least 30 minutes of exercise that strengthens your muscles (resistance exercise) at least 3 days a week. This may include lifting weights or pilates.  Do not use any products that contain nicotine or tobacco. This includes cigarettes and e-cigarettes. If you need help quitting, ask your doctor.  Check your blood pressure at home as told by your doctor.  Keep all follow-up visits as told by your doctor. This is important. Medicines  Take over-the-counter and prescription medicines only as told by your doctor. Follow directions carefully.  Do not skip doses of blood pressure medicine. The medicine does not work as well if you skip doses. Skipping doses also puts you at risk for problems.  Ask your doctor about side effects or reactions to medicines that you should watch for. Contact a doctor if:  You think you are having a reaction to the medicine you are taking.  You have headaches that keep coming back (recurring).  You feel dizzy.  You have swelling in your ankles.  You have trouble with your vision. Get help right away if:  You get a very bad headache.  You start to feel confused.  You feel weak or numb.  You feel faint.  You get very bad pain in your: ? Chest. ? Belly (abdomen).  You throw up (vomit) more than once.  You have trouble breathing. Summary  Hypertension is another name for high blood pressure.  Making healthy choices can  help lower blood pressure. If your blood pressure cannot be controlled with healthy choices, you may need to take medicine. This information is not intended to replace advice given to you by your health care provider. Make sure you discuss any questions you have with your health care provider. Document Released: 12/15/2007 Document Revised: 05/26/2016 Document Reviewed: 05/26/2016 Elsevier Interactive Patient Education  [employer].

## 2017-11-17 NOTE — Progress Notes (Signed)
Subjective:  I acted as a Education administrator for [employer]. Yancey Flemings, [place]   Patient ID: Ann Barton, female    DOB: 06/28/1956, 62 y.o.   MRN: [id number]  Chief Complaint  Patient presents with  . Follow-up    HPI  Patient is in today for follow up visit. She is doing well. No recent febrile illness or hospitalizations. She has some trouble with insomnia at times. Mild congestion from seasonal allergies noted. Her varicose veins have begun to recur and are sometimes tender. Denies CP/palp/SOB/HA/congestion/fevers/GI or GU c/o. Taking meds as prescribed  Patient Care Team: Mosie Lukes, MD as PCP - General (Family Medicine)   Past Medical History:  Diagnosis Date  . Allergy    seasonal  . Arthritis    left hip, low back  . Atrophic vaginitis 01/25/2011  . Bladder prolapse, female, acquired 05/16/2013  . Chicken pox as a child  . Cough 04/19/2012  . 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 Goiter   . Hypertension   . Measles as a child  . Mumps as a child  . Neutropenia 03/10/2011  . Other and unspecified hyperlipidemia 08/23/2013  . Other malaise and fatigue 08/26/2013  . Pharyngitis, acute 03/09/2011  . Urinary frequency 04/27/2011  . Vaginitis 04/27/2011    Past Surgical History:  Procedure Laterality Date  . BIOPSY THYROID     FNA, benign  . BREAST BIOPSY Left    FNA, benign  . CHOLECYSTECTOMY  1998  . HERNIA REPAIR Right    right, inguinal  . in grown toenails removed     partial, b/l  . laser surgery on vericose veins     b/l  . NASAL SEPTUM SURGERY  2015  . TONSILLECTOMY  as a child  . VAGINAL HYSTERECTOMY  1988   done for polapse    Family History  Problem Relation Age of Onset  . Atrial fibrillation Mother   . Obesity Mother   . Diabetes Mother 58       Type 2  . Arthritis Mother        in right knee  . Heart disease Mother        atrial fibrillation, CHF  . Hernia Mother        never recovered from repair  . Hypertension Father   . Ulcers Father   .  Pulmonary fibrosis Father   . Diabetes Maternal Grandmother        type 2?  . Heart attack Maternal Grandmother   . Hearing loss Maternal Grandmother        MI  . Parkinsonism Maternal Grandfather   . Stroke Paternal Grandmother     Social History   Socioeconomic History  . Marital status: Unknown    Spouse name: Not on file  . Number of children: 2  . Years of education: Not on file  . Highest education level: Not on file  Occupational History  . Occupation: NICU    Employer: [employer]  . Financial resource strain: Not on file  . Food insecurity:    Worry: Not on file    Inability: Not on file  . Transportation needs:    Medical: Not on file    Non-medical: Not on file  Tobacco Use  . Smoking status: Former Smoker    Packs/day: 1.00    Years: 5.00    Pack years: 5.00    Types: Cigarettes    Last attempt to quit: 07/12/1976  Years since quitting: 41.3  . Smokeless tobacco: Never Used  Substance and Sexual Activity  . Alcohol use: Yes    Comment: occasionally  . Drug use: No  . Sexual activity: Yes    Partners: Male    Comment: no dietary restrictions. but does minimize refined sugar  Lifestyle  . Physical activity:    Days per week: Not on file    Minutes per session: Not on file  . Stress: Not on file  Relationships  . Social connections:    Talks on phone: Not on file    Gets together: Not on file    Attends religious service: Not on file    Active member of club or organization: Not on file    Attends meetings of clubs or organizations: Not on file    Relationship status: Not on file  . Intimate partner violence:    Fear of current or ex partner: Not on file    Emotionally abused: Not on file    Physically abused: Not on file    Forced sexual activity: Not on file  Other Topics Concern  . Not on file  Social History Narrative  . Not on file    Outpatient Medications Prior to Visit  Medication Sig Dispense Refill  .  Cholecalciferol (VITAMIN D3) 5000 UNITS CAPS Take 1 capsule by mouth daily.     . Coenzyme Q10 (COQ-10) 200 MG CAPS Take 1 tablet by mouth daily.     . diphenhydrAMINE (BENADRYL) 25 mg capsule Take 25 mg by mouth as needed for itching or sleep.    Marland Kitchen HAWTHORN PO Take 500 mg by mouth daily.    Marland Kitchen Ketotifen Fumarate (ALLERGY EYE DROPS OP) Apply to eye. 3 gtts,daily,SL    . metoprolol succinate (TOPROL-XL) 100 MG 24 hr tablet Take 1 tablet (100 mg total) by mouth daily. Take with or immediately following a meal. 90 tablet 3  . Multiple Vitamin (MULTIVITAMIN) tablet Take 1 tablet by mouth daily.      . Nutritional Supplements (GRAPESEED EXTRACT PO) Take by mouth daily.    . Probiotic Product (PROBIOTIC DAILY PO) Take 1 tablet by mouth daily.     Marland Kitchen triamterene-hydrochlorothiazide (MAXZIDE-25) 37.5-25 MG tablet Take 1 tablet by mouth daily. 90 tablet 3  . zolpidem (AMBIEN) 10 MG tablet Take 1 tablet (10 mg total) by mouth at bedtime as needed for sleep. 15 tablet 1   No facility-administered medications prior to visit.     Allergies  Allergen Reactions  . Lisinopril Cough  . Seasonal Ic [Cholestatin]     Review of Systems  Constitutional: Positive for malaise/fatigue. Negative for fever.  HENT: Negative for congestion.   Eyes: Negative for blurred vision.  Respiratory: Negative for shortness of breath.   Cardiovascular: Negative for chest pain, palpitations and leg swelling.  Gastrointestinal: Negative for abdominal pain, blood in stool and nausea.  Genitourinary: Negative for dysuria and frequency.  Musculoskeletal: Negative for falls.  Skin: Negative for rash.  Neurological: Negative for dizziness, loss of consciousness and headaches.  Endo/Heme/Allergies: Negative for environmental allergies.  Psychiatric/Behavioral: Negative for depression and suicidal ideas. The patient is nervous/anxious and has insomnia.        Objective:    Physical Exam  Constitutional: She is oriented to  person, place, and time. No distress.  HENT:  Head: Normocephalic and atraumatic.  Eyes: Conjunctivae are normal.  Neck: Neck supple. No thyromegaly present.  Cardiovascular: Normal rate, regular rhythm and normal heart sounds.  No murmur heard. Pulmonary/Chest: Effort normal and breath sounds normal. She has no wheezes.  Abdominal: She exhibits no distension and no mass.  Musculoskeletal: She exhibits no edema.  Lymphadenopathy:    She has no cervical adenopathy.  Neurological: She is alert and oriented to person, place, and time.  Skin: Skin is warm and dry. No rash noted. She is not diaphoretic.  Psychiatric: Judgment normal.    BP (!) 148/80 (BP Location: Left Arm, Patient Position: Sitting, Cuff Size: Normal)   Pulse 61   Temp 97.6 F (36.4 C) (Oral)   Resp 16   Ht 5' 8.9" (1.75 m)   Wt 166 lb 6.4 oz (75.5 kg)   SpO2 100%   BMI 24.65 kg/m  Wt Readings from Last 3 Encounters:  11/17/17 166 lb 6.4 oz (75.5 kg)  05/19/17 169 lb 12.8 oz (77 kg)  09/20/14 165 lb 6.4 oz (75 kg)   BP Readings from Last 3 Encounters:  11/17/17 (!) 148/80  08/10/17 123/82  07/21/17 (!) 153/92     Immunization History  Administered Date(s) Administered  . Influenza Split 04/11/2012  . Influenza-Unspecified 04/11/2013, 04/11/2014, 03/28/2017  . Pneumococcal Conjugate-13 05/14/2013  . Tdap 05/14/2013    Health Maintenance  Topic Date Due  . Hepatitis C Screening  07-Jun-1956  . HIV Screening  05/17/1970  . COLONOSCOPY  05/17/2005  . MAMMOGRAM  10/05/2013  . INFLUENZA VACCINE  02/09/2018  . TETANUS/TDAP  05/15/2023    Lab Results  Component Value Date   WBC 6.2 05/19/2017   HGB 13.3 05/19/2017   HCT 40.3 05/19/2017   PLT 227.0 05/19/2017   GLUCOSE 101 (H) 05/19/2017   CHOL 186 05/19/2017   TRIG 195.0 (H) 05/19/2017   HDL 43.80 05/19/2017   LDLCALC 103 (H) 05/19/2017   ALT 13 05/19/2017   AST 16 05/19/2017   NA 141 05/19/2017   K 3.7 05/19/2017   CL 104 05/19/2017    CREATININE 0.65 05/19/2017   BUN 11 05/19/2017   CO2 32 05/19/2017   TSH 0.87 05/19/2017    Lab Results  Component Value Date   TSH 0.87 05/19/2017   Lab Results  Component Value Date   WBC 6.2 05/19/2017   HGB 13.3 05/19/2017   HCT 40.3 05/19/2017   MCV 88.4 05/19/2017   PLT 227.0 05/19/2017   Lab Results  Component Value Date   NA 141 05/19/2017   K 3.7 05/19/2017   CO2 32 05/19/2017   GLUCOSE 101 (H) 05/19/2017   BUN 11 05/19/2017   CREATININE 0.65 05/19/2017   BILITOT 0.3 05/19/2017   ALKPHOS 59 05/19/2017   AST 16 05/19/2017   ALT 13 05/19/2017   PROT 7.2 05/19/2017   ALBUMIN 4.2 05/19/2017   CALCIUM 9.7 05/19/2017   GFR 98.16 05/19/2017   Lab Results  Component Value Date   CHOL 186 05/19/2017   Lab Results  Component Value Date   HDL 43.80 05/19/2017   Lab Results  Component Value Date   LDLCALC 103 (H) 05/19/2017   Lab Results  Component Value Date   TRIG 195.0 (H) 05/19/2017   Lab Results  Component Value Date   CHOLHDL 4 05/19/2017   No results found for: HGBA1C       Assessment & Plan:   Problem List Items Addressed This Visit    Hypertension    Well controlled, no changes to meds. Encouraged heart healthy diet such as the DASH diet and exercise as tolerated.  Relevant Orders   CBC   Comprehensive metabolic panel   TSH   High cholesterol    Encouraged heart healthy diet, increase exercise, avoid trans fats, consider a krill oil cap daily      Relevant Orders   Lipid panel   Insomnia    Encouraged good sleep hygiene such as dark, quiet room. No blue/green glowing lights such as computer screens in bedroom. No alcohol or stimulants in evening. Cut down on caffeine as able. Regular exercise is helpful but not just prior to bed time. Ambien is continuing to help despite being widowed in March. She will let us know if she has any new troubles or concerns        Other Visit Diagnoses    Environmental allergies    -  Primary     Relevant Orders   Ambulatory referral to ENT   Recurrent varicose veins       Relevant Orders   Ambulatory referral to Vascular Surgery      I am having Butch Penny L. Burkett maintain her multivitamin, Vitamin D3, Probiotic Product (PROBIOTIC DAILY PO), CoQ-10, diphenhydrAMINE, HAWTHORN PO, Nutritional Supplements (GRAPESEED EXTRACT PO), Ketotifen Fumarate (ALLERGY EYE DROPS OP), metoprolol succinate, triamterene-hydrochlorothiazide, and zolpidem.  No orders of the defined types were placed in this encounter.   CMA served as Education administrator during this visit. History, Physical and Plan performed by medical provider. Documentation and orders reviewed and attested to.  Penni Homans, MD

## 2017-11-17 NOTE — Assessment & Plan Note (Signed)
Well controlled, no changes to meds. Encouraged heart healthy diet such as the DASH diet and exercise as tolerated.  °

## 2017-11-18 ENCOUNTER — Encounter: Payer: Self-pay | Admitting: Family Medicine

## 2017-12-30 MED FILL — ZOLPIDEM TARTRATE 10 MG TAB: 10 | 15 days supply | Qty: 15 | Fill #1

## 2017-12-30 MED FILL — METOPROLOL SUCCINATE ER 100: 100 | 90 days supply | Qty: 90 | Fill #2

## 2018-01-03 ENCOUNTER — Encounter: Payer: No Typology Code available for payment source | Admitting: Vascular Surgery

## 2018-01-10 ENCOUNTER — Other Ambulatory Visit: Payer: Self-pay

## 2018-01-10 DIAGNOSIS — I83893 Varicose veins of bilateral lower extremities with other complications: Secondary | ICD-10-CM

## 2018-01-11 ENCOUNTER — Encounter: Payer: Self-pay | Admitting: Vascular Surgery

## 2018-01-11 ENCOUNTER — Ambulatory Visit (HOSPITAL_COMMUNITY)
Admission: RE | Admit: 2018-01-11 | Discharge: 2018-01-11 | Disposition: A | Discharge status: Home / Self Care | Payer: No Typology Code available for payment source | Source: Ambulatory Visit | Attending: Vascular Surgery | Admitting: Vascular Surgery

## 2018-01-11 ENCOUNTER — Other Ambulatory Visit: Payer: Self-pay

## 2018-01-11 ENCOUNTER — Ambulatory Visit: Payer: No Typology Code available for payment source | Admitting: Vascular Surgery

## 2018-01-11 VITALS — BP 141/88 | HR 78 | Temp 97.7°F | Resp 14 | Ht 68.0 in | Wt 161.0 lb

## 2018-01-11 DIAGNOSIS — I83893 Varicose veins of bilateral lower extremities with other complications: Secondary | ICD-10-CM | POA: Insufficient documentation

## 2018-01-11 DIAGNOSIS — I83813 Varicose veins of bilateral lower extremities with pain: Secondary | ICD-10-CM

## 2018-01-11 NOTE — Progress Notes (Signed)
Patient name: Ann Barton MRN: [id number] DOB: 1955/08/14 Sex: female   REASON FOR CONSULT:    Swelling and varicose veins bilateral lower extremities.  The consult is requested by Dr. Penni Homans.  HPI:   Ann Barton is a pleasant 62 y.o. female, referred with bilateral varicose veins and leg swelling.  I have reviewed the records from the referring office.  The patient was seen on 11/17/2016.  She has a history of hypertension which has been well controlled.  In addition the patient has high cholesterol is also under good control.  The patient has had a long history of bilateral lower extremity varicose veins she had laser ablation here locally in the left leg and also in the right leg.  She thinks this was about 7 and 9 years ago.  She is developed some recurrent varicose veins especially on the right.  She experiences aching pain heaviness and tiredness in her legs.  The symptoms are aggravated by standing and sitting and relieved with elevation.  She does have compression stockings which she wears.  She is unaware of any previous history of DVT or phlebitis.  Her symptoms are worse on the right side.  She feels that her symptoms have been gradually progressive.  Past Medical History:  Diagnosis Date  . Allergy    seasonal  . Arthritis    left hip, low back  . Atrophic vaginitis 01/25/2011  . Bladder prolapse, female, acquired 05/16/2013  . Chicken pox as a child  . Cough 04/19/2012  . 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 Goiter   . Hypertension   . Measles as a child  . Mumps as a child  . Neutropenia 03/10/2011  . Other and unspecified hyperlipidemia 08/23/2013  . Other malaise and fatigue 08/26/2013  . Pharyngitis, acute 03/09/2011  . Urinary frequency 04/27/2011  . Vaginitis 04/27/2011    Family History  Problem Relation Age of Onset  . Atrial fibrillation Mother   . Obesity Mother   . Diabetes Mother 15       Type 2  . Arthritis Mother        in right knee  .  Heart disease Mother        atrial fibrillation, CHF  . Hernia Mother        never recovered from repair  . Hypertension Father   . Ulcers Father   . Pulmonary fibrosis Father   . Diabetes Maternal Grandmother        type 2?  . Heart attack Maternal Grandmother   . Hearing loss Maternal Grandmother        MI  . Parkinsonism Maternal Grandfather   . Stroke Paternal Grandmother     SOCIAL HISTORY: Social History   Socioeconomic History  . Marital status: Unknown    Spouse name: Not on file  . Number of children: 2  . Years of education: Not on file  . Highest education level: Not on file  Occupational History  . Occupation: NICU    Employer: [employer]  . Financial resource strain: Not on file  . Food insecurity:    Worry: Not on file    Inability: Not on file  . Transportation needs:    Medical: Not on file    Non-medical: Not on file  Tobacco Use  . Smoking status: Former Smoker    Packs/day: 1.00    Years: 5.00    Pack years: 5.00    Types:  Cigarettes    Last attempt to quit: 07/12/1976    Years since quitting: 41.5  . Smokeless tobacco: Never Used  Substance and Sexual Activity  . Alcohol use: Yes    Comment: occasionally  . Drug use: No  . Sexual activity: Yes    Partners: Male    Comment: no dietary restrictions. but does minimize refined sugar  Lifestyle  . Physical activity:    Days per week: Not on file    Minutes per session: Not on file  . Stress: Not on file  Relationships  . Social connections:    Talks on phone: Not on file    Gets together: Not on file    Attends religious service: Not on file    Active member of club or organization: Not on file    Attends meetings of clubs or organizations: Not on file    Relationship status: Not on file  . Intimate partner violence:    Fear of current or ex partner: Not on file    Emotionally abused: Not on file    Physically abused: Not on file    Forced sexual activity: Not on file    Other Topics Concern  . Not on file  Social History Narrative  . Not on file    Allergies  Allergen Reactions  . Lisinopril Cough  . Seasonal Ic [Cholestatin]     Current Outpatient Medications  Medication Sig Dispense Refill  . Cholecalciferol (VITAMIN D3) 5000 UNITS CAPS Take 1 capsule by mouth daily.     . Coenzyme Q10 (COQ-10) 200 MG CAPS Take 1 tablet by mouth daily.     . diphenhydrAMINE (BENADRYL) 25 mg capsule Take 25 mg by mouth as needed for itching or sleep.    Marland Kitchen HAWTHORN PO Take 500 mg by mouth daily.    Marland Kitchen Ketotifen Fumarate (ALLERGY EYE DROPS OP) Apply to eye. 3 gtts,daily,SL    . metoprolol succinate (TOPROL-XL) 100 MG 24 hr tablet Take 1 tablet (100 mg total) by mouth daily. Take with or immediately following a meal. 90 tablet 3  . Multiple Vitamin (MULTIVITAMIN) tablet Take 1 tablet by mouth daily.      . Nutritional Supplements (GRAPESEED EXTRACT PO) Take by mouth daily.    . Probiotic Product (PROBIOTIC DAILY PO) Take 1 tablet by mouth daily.     Marland Kitchen triamterene-hydrochlorothiazide (MAXZIDE-25) 37.5-25 MG tablet Take 1 tablet by mouth daily. 90 tablet 3  . zolpidem (AMBIEN) 10 MG tablet Take 1 tablet (10 mg total) by mouth at bedtime as needed for sleep. 15 tablet 1   No current facility-administered medications for this visit.     REVIEW OF SYSTEMS:  [X]  denotes positive finding, [ ]  denotes negative finding Cardiac  Comments:  Chest pain or chest pressure:    Shortness of breath upon exertion:    Short of breath when lying flat:    Irregular heart rhythm:        Vascular    Pain in calf, thigh, or hip brought on by ambulation:    Pain in feet at night that wakes you up from your sleep:     Blood clot in your veins:    Leg swelling:  x       Pulmonary    Oxygen at home:    Productive cough:     Wheezing:         Neurologic    Sudden weakness in arms or legs:     Sudden numbness  in arms or legs:     Sudden onset of difficulty speaking or slurred  speech:    Temporary loss of vision in one eye:     Problems with dizziness:         Gastrointestinal    Blood in stool:     Vomited blood:         Genitourinary    Burning when urinating:     Blood in urine:        Psychiatric    Major depression:         Hematologic    Bleeding problems:    Problems with blood clotting too easily:        Skin    Rashes or ulcers:        Constitutional    Fever or chills:     PHYSICAL EXAM:   Vitals:   01/11/18 1312 01/11/18 1319  BP: (!) 160/89 (!) 141/88  Pulse: 78 78  Resp: 14   Temp: 97.7 F (36.5 C)   TempSrc: Oral   SpO2: 98%   Weight: 161 lb (73 kg)   Height: 5\' 8"  (1.727 m)     GENERAL: The patient is a well-nourished female, in no acute distress. The vital signs are documented above. CARDIAC: There is a regular rate and rhythm.  VASCULAR: I do not detect carotid bruits. She has palpable femoral pulses.  I cannot palpate pedal pulses.  Both feet are warm and well-perfused. She has some truncal varicosities along the medial calf and anterior leg on the right.  She has some small varicose veins in the medial left thigh PULMONARY: There is good air exchange bilaterally without wheezing or rales. ABDOMEN: Soft and non-tender with normal pitched bowel sounds.  MUSCULOSKELETAL: There are no major deformities or cyanosis. NEUROLOGIC: No focal weakness or paresthesias are detected. SKIN: There are no ulcers or rashes noted. PSYCHIATRIC: The patient has a normal affect.  DATA:    VENOUS DUPLEX: I have independently interpreted her venous duplex scan.  On the right side there is no evidence of DVT or superficial thrombophlebitis.  She does have reflux in the common femoral vein on the right.  She has superficial venous reflux involving the distal thigh and mid thigh great saphenous vein on the right.  This vein is not especially dilated.  There is also some reflux in the short saphenous vein on the right.  This vein is not  especially dilated.  On the left side with no evidence of DVT or superficial thrombophlebitis.  There is deep venous reflux involving the common femoral vein.  There is no saphenous vein identified on the left side.  MEDICAL ISSUES:   CHRONIC VENOUS INSUFFICIENCY: This patient does have evidence of chronic venous insufficiency bilaterally which I think explains her symptoms.  We have discussed that this is a chronic problem which is often progressive.  We have therefore discussed some lifestyle changes to help her stay on top of this.  I discussed the importance of intermittent leg elevation the proper positioning for this.  She already has compression stockings and did not want any new ones.  I have encouraged her to avoid prolonged sitting and standing.  I have encouraged her to exercise and walk as much as possible.  We also discussed the importance of maintaining a healthy weight.  Currently I do not think that she is a candidate for endovenous laser ablation or stab phlebectomy.  However in the future if her symptoms progress  this could potentially be a consideration if the area of reflux in the mid thigh and distal thigh on the right progresses.  I will be happy to see her back at any time if any new vascular issues arise.  Deitra Mayo [facility] [phone]

## 2018-01-11 NOTE — Progress Notes (Signed)
Vitals:   01/11/18 1312  BP: (!) 160/89  Pulse: 78  Resp: 14  Temp: 97.7 F (36.5 C)  TempSrc: Oral  SpO2: 98%  Weight: 161 lb (73 kg)  Height: 5\' 8"  (1.727 m)

## 2018-02-16 ENCOUNTER — Encounter: Payer: Self-pay | Admitting: Family Medicine

## 2018-02-16 NOTE — Telephone Encounter (Signed)
Relation to pt: self  Call back number: [phone] (M)   Reason for call:  Patient requesting urine orders only, due to the office not having any availability, patient states frequent urination, burning and bladder discomfort x1, please advise

## 2018-02-17 ENCOUNTER — Other Ambulatory Visit: Payer: Self-pay | Admitting: Family Medicine

## 2018-02-17 ENCOUNTER — Other Ambulatory Visit (INDEPENDENT_AMBULATORY_CARE_PROVIDER_SITE_OTHER): Payer: No Typology Code available for payment source

## 2018-02-17 ENCOUNTER — Other Ambulatory Visit: Payer: Self-pay

## 2018-02-17 DIAGNOSIS — R300 Dysuria: Secondary | ICD-10-CM | POA: Diagnosis not present

## 2018-02-17 LAB — POC URINALSYSI DIPSTICK (AUTOMATED)
Bilirubin, UA: NEGATIVE
Glucose, UA: NEGATIVE
Ketones, UA: NEGATIVE
Nitrite, UA: POSITIVE
PROTEIN UA: POSITIVE — AB
SPEC GRAV UA: 1.015 (ref 1.010–1.025)
Urobilinogen, UA: 0.2 E.U./dL
pH, UA: 6.5 (ref 5.0–8.0)

## 2018-02-17 MED ORDER — CEFDINIR 300 MG PO CAPS: 300.0000 mg | ORAL_CAPSULE | Freq: Two times a day (BID) | ORAL | 0 refills | Status: DC

## 2018-02-17 NOTE — Telephone Encounter (Signed)
I have placed order for UA and Culture. Patient coming in today to leave sample. Abby with [facility] is going to schedule.

## 2018-02-17 NOTE — Progress Notes (Signed)
Patient came in today for poct ua. PCP notified and advised of medication to be sent in

## 2018-02-17 NOTE — Addendum Note (Signed)
Addended by: Caffie Pinto on: 02/17/2018 02:55 PM   Modules accepted: Orders

## 2018-02-19 LAB — URINE CULTURE
MICRO NUMBER: 90945766
SPECIMEN QUALITY: ADEQUATE

## 2018-03-27 ENCOUNTER — Other Ambulatory Visit: Payer: Self-pay | Admitting: Family Medicine

## 2018-03-27 MED ORDER — ZOLPIDEM TARTRATE 10 MG PO TABS: 10.0000 mg | ORAL_TABLET | Freq: Every evening | ORAL | 1 refills | Status: DC | PRN

## 2018-03-27 NOTE — Telephone Encounter (Signed)
Requesting:ambien Contract:yes UDS:low risk next screen 05/19/18 Last OV:11/17/17 Next OV:05/25/18 Last Refill:09/26/17  #[id number] Database:   Please advise

## 2018-03-28 MED FILL — ZOLPIDEM TARTRATE 10 MG TAB: 10 | 15 days supply | Qty: 15 | Fill #0

## 2018-03-28 MED FILL — METOPROLOL SUCCINATE ER 100: 100 | 90 days supply | Qty: 90 | Fill #3

## 2018-04-19 ENCOUNTER — Emergency Department
Admission: EM | Admit: 2018-04-19 | Discharge: 2018-04-19 | Disposition: A | Discharge status: Home / Self Care | Payer: No Typology Code available for payment source | Source: Home / Self Care | Attending: Family Medicine | Admitting: Family Medicine

## 2018-04-19 ENCOUNTER — Emergency Department (INDEPENDENT_AMBULATORY_CARE_PROVIDER_SITE_OTHER): Payer: No Typology Code available for payment source

## 2018-04-19 ENCOUNTER — Other Ambulatory Visit: Payer: Self-pay

## 2018-04-19 DIAGNOSIS — S32040A Wedge compression fracture of fourth lumbar vertebra, initial encounter for closed fracture: Secondary | ICD-10-CM

## 2018-04-19 DIAGNOSIS — M545 Low back pain, unspecified: Secondary | ICD-10-CM

## 2018-04-19 DIAGNOSIS — X500XXA Overexertion from strenuous movement or load, initial encounter: Secondary | ICD-10-CM | POA: Diagnosis not present

## 2018-04-19 DIAGNOSIS — M4856XA Collapsed vertebra, not elsewhere classified, lumbar region, initial encounter for fracture: Secondary | ICD-10-CM | POA: Diagnosis not present

## 2018-04-19 MED ORDER — CALCITONIN (SALMON) 200 UNIT/ACT NA SOLN: 1.0000 | Freq: Every day | NASAL | 12 refills | Status: DC

## 2018-04-19 MED ORDER — CYCLOBENZAPRINE HCL 10 MG PO TABS: 10.0000 mg | ORAL_TABLET | Freq: Two times a day (BID) | ORAL | 0 refills | Status: DC | PRN

## 2018-04-19 MED FILL — CALCITONIN-SALMON 200 UNITS: 200 | 30 days supply | Qty: 4 | Fill #0

## 2018-04-19 MED FILL — CYCLOBENZAPRINE HCL 10 MG T: 10 | 10 days supply | Qty: 20 | Fill #0

## 2018-04-19 NOTE — ED Triage Notes (Signed)
Last night dropped something on the floor, and lifted the end of the loveseat to get it, and felt a pop in the lower back.  Now is having pain in the lower back and hips.  No radiation to the legs.

## 2018-04-19 NOTE — ED Provider Notes (Signed)
[facility]    CSN: [id number] Arrival date & time: 04/19/18  0855     History   Chief Complaint Chief Complaint  Patient presents with  . Back Pain    HPI YAMILA CRAGIN is a 62 y.o. female.   HPI JASELYNN TAMAS is a 62 y.o. female presenting to UC with c/o sudden onset lower back pain that started yesterday after lifting a loveseat last night. She felt a pop in her lower back. Pain is aching and sore, worse with certain movement. Moderate severity. Mild soreness in her hips but denies pain, weakness, or numbness in her legs.  No change in bowel or bladder habits.    Past Medical History:  Diagnosis Date  . Allergy    seasonal  . Arthritis    left hip, low back  . Atrophic vaginitis 01/25/2011  . Bladder prolapse, female, acquired 05/16/2013  . Chicken pox as a child  . Cough 04/19/2012  . 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 Goiter   . Hypertension   . Measles as a child  . Mumps as a child  . Neutropenia 03/10/2011  . Other and unspecified hyperlipidemia 08/23/2013  . Other malaise and fatigue 08/26/2013  . Pharyngitis, acute 03/09/2011  . Urinary frequency 04/27/2011  . Vaginitis 04/27/2011    Patient Active Problem List   Diagnosis Date Noted  . Insomnia 05/23/2017  . Multiple food allergies 05/19/2017  . Other malaise and fatigue 08/26/2013  . High cholesterol 08/23/2013  . Bladder prolapse, female, acquired 05/16/2013  . Cough 04/19/2012  . Urinary frequency 04/27/2011  . Hypokalemia 03/10/2011  . Neutropenia ([place]) 03/10/2011  . Arthritis 01/25/2011  . Atrophic vaginitis 01/25/2011  . Preventative health care 01/25/2011  . Diverticulosis 01/25/2011  . Strain of knee   . Seasonal and perennial allergic rhinitis   . Hypertension   . Chicken pox   . Mumps   . Measles   . Goiter     Past Surgical History:  Procedure Laterality Date  . BIOPSY THYROID     FNA, benign  . BREAST BIOPSY Left    FNA, benign  . CHOLECYSTECTOMY  1998  .  HERNIA REPAIR Right    right, inguinal  . in grown toenails removed     partial, b/l  . laser surgery on vericose veins     b/l  . NASAL SEPTUM SURGERY  2015  . TONSILLECTOMY  as a child  . VAGINAL HYSTERECTOMY  1988   done for polapse    OB History    Gravida  2   Para  2   Term  2   Preterm      AB      Living  2     SAB      TAB      Ectopic      Multiple      Live Births               Home Medications    Prior to Admission medications   Medication Sig Start Date End Date Taking? Authorizing Provider  calcitonin, salmon, (MIACALCIN/FORTICAL) 200 UNIT/ACT nasal spray Place 1 spray into alternate nostrils daily. 04/19/18   Noe Gens, PA-C  cefdinir (OMNICEF) 300 MG capsule Take 1 capsule (300 mg total) by mouth 2 (two) times daily. 02/17/18   Mosie Lukes, MD  Cholecalciferol (VITAMIN D3) 5000 UNITS CAPS Take 1 capsule by mouth daily.     [provider]  Coenzyme Q10 (COQ-10) 200 MG CAPS Take 1 tablet by mouth daily.     [provider]  cyclobenzaprine (FLEXERIL) 10 MG tablet Take 1 tablet (10 mg total) by mouth 2 (two) times daily as needed. 04/19/18   Noe Gens, PA-C  diphenhydrAMINE (BENADRYL) 25 mg capsule Take 25 mg by mouth as needed for itching or sleep.    [provider]  HAWTHORN PO Take 500 mg by mouth daily.    [provider]  Ketotifen Fumarate (ALLERGY EYE DROPS OP) Apply to eye. 3 gtts,daily,SL    [provider]  metoprolol succinate (TOPROL-XL) 100 MG 24 hr tablet Take 1 tablet (100 mg total) by mouth daily. Take with or immediately following a meal. 06/16/17   Mosie Lukes, MD  Multiple Vitamin (MULTIVITAMIN) tablet Take 1 tablet by mouth daily.      [provider]  Nutritional Supplements (GRAPESEED EXTRACT PO) Take by mouth daily.    [provider]  Probiotic Product (PROBIOTIC DAILY PO) Take 1 tablet by mouth daily.     [provider]    triamterene-hydrochlorothiazide (MAXZIDE-25) 37.5-25 MG tablet Take 1 tablet by mouth daily. 07/21/17   Mosie Lukes, MD  zolpidem (AMBIEN) 10 MG tablet Take 1 tablet (10 mg total) by mouth at bedtime as needed for sleep. 03/27/18 04/26/18  Mosie Lukes, MD    Family History Family History  Problem Relation Age of Onset  . Atrial fibrillation Mother   . Obesity Mother   . Diabetes Mother 16       Type 2  . Arthritis Mother        in right knee  . Heart disease Mother        atrial fibrillation, CHF  . Hernia Mother        never recovered from repair  . Hypertension Father   . Ulcers Father   . Pulmonary fibrosis Father   . Diabetes Maternal Grandmother        type 2?  . Heart attack Maternal Grandmother   . Hearing loss Maternal Grandmother        MI  . Parkinsonism Maternal Grandfather   . Stroke Paternal Grandmother     Social History Social History   Tobacco Use  . Smoking status: Former Smoker    Packs/day: 1.00    Years: 5.00    Pack years: 5.00    Types: Cigarettes    Last attempt to quit: 07/12/1976    Years since quitting: 41.7  . Smokeless tobacco: Never Used  Substance Use Topics  . Alcohol use: Yes    Comment: occasionally  . Drug use: No     Allergies   Lisinopril and Seasonal ic [cholestatin]   Review of Systems Review of Systems  Constitutional: Negative for chills and fever.  Genitourinary: Negative for dysuria, frequency and hematuria.  Musculoskeletal: Positive for back pain and myalgias. Negative for arthralgias.  Skin: Negative for rash.  Neurological: Negative for weakness and numbness.     Physical Exam Triage Vital Signs ED Triage Vitals  Enc Vitals Group     BP 04/19/18 0918 (!) 192/82     Pulse Rate 04/19/18 0918 (!) 59     Resp --      Temp 04/19/18 0918 (!) 97.5 F (36.4 C)     Temp Source 04/19/18 0918 Oral     SpO2 04/19/18 0918 99 %     Weight 04/19/18 0919 164 lb (  74.4 kg)     Height 04/19/18 0919 5\' 8"  (1.727  m)     Head Circumference --      Peak Flow --      Pain Score 04/19/18 0919 4     Pain Loc --      Pain Edu? --      Excl. in [place]? --    No data found.  Updated Vital Signs BP (!) 192/82 (BP Location: Right Arm)   Pulse (!) 59   Temp (!) 97.5 F (36.4 C) (Oral)   Ht 5\' 8"  (1.727 m)   Wt 164 lb (74.4 kg)   SpO2 99%   BMI 24.94 kg/m   Visual Acuity Right Eye Distance:   Left Eye Distance:   Bilateral Distance:    Right Eye Near:   Left Eye Near:    Bilateral Near:     Physical Exam  Constitutional: She is oriented to person, place, and time. She appears well-developed and well-nourished. No distress.  HENT:  Head: Normocephalic and atraumatic.  Eyes: EOM are normal.  Neck: Normal range of motion.  Cardiovascular: Normal rate.  Pulmonary/Chest: Effort normal. No respiratory distress.  Musculoskeletal: Normal range of motion. She exhibits tenderness. She exhibits no edema.  Midline lumbar tenderness, tenderness to surrounding muscles. Full ROM upper and lower extremities bilaterally. Negative straight leg raise.   Neurological: She is alert and oriented to person, place, and time.  Skin: Skin is warm and dry. Capillary refill takes less than 2 seconds. No rash noted. She is not diaphoretic. No erythema.  Psychiatric: She has a normal mood and affect. Her behavior is normal.  Nursing note and vitals reviewed.    UC Treatments / Results  Labs (all labs ordered are listed, but only abnormal results are displayed) Labs Reviewed - No data to display  EKG None  Radiology Dg Lumbar Spine Complete  Result Date: 04/19/2018 CLINICAL DATA:  Lumbago after moving furniture EXAM: LUMBAR SPINE - COMPLETE 4+ VIEW COMPARISON:  None. FINDINGS: Frontal, lateral, spot lumbosacral lateral, and bilateral oblique views were obtained. There is a focal wedge fracture along the superior aspect of the L4 vertebral body which appears acute. There is slight anterior wedging at L2 which does  not appear acute. No other evident fracture. No spondylolisthesis. Disc spaces appear unremarkable. There is facet osteoarthritic change at L4-5 and L5-S1 bilaterally. A lesser degree of osteoarthritic changes noted in the facet at L3-4 on the left. IMPRESSION: Acute appearing mild anterior wedge fracture along the superior aspect of the L4 vertebral body anteriorly. Slight anterior wedging at L2 which does not appear acute. 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 No appreciable disc space narrowing. No spondylolisthesis. These results will be called to the ordering clinician or representative by the Radiologist Assistant, and communication documented in the PACS or zVision Dashboard. Electronically Signed   By: Lowella Grip III M.D.   On: 04/19/2018 10:05    Procedures Procedures (including critical care time)  Medications Ordered in UC Medications - No data to display  Initial Impression / Assessment and Plan / UC Course  I have reviewed the triage vital signs and the nursing notes.  Pertinent labs & imaging results that were available during my care of the patient were reviewed by me and considered in my medical decision making (see chart for details).     Imaging discussed with pt and Dr. Georgina Snell, sports medicine. No red flag symptoms. Pain  is mild to moderate. Will hold off on back brace at this time and treat symptomatically.  Final Clinical Impressions(s) / UC Diagnoses   Final diagnoses:  Closed wedge compression fracture of fourth lumbar vertebra, initial encounter ([place])  Acute bilateral low back pain without sciatica     Discharge Instructions      You may use over the counter NSAIDs such as naproxen or ibuprofen for pain as well as the muscle relaxer (flexeril) for muscle spasms.  Flexeril (cyclobenzaprine) is a muscle relaxer and may cause drowsiness. Do not drink alcohol, drive, or operate heavy machinery while  taking.  Please call to schedule an appointment next week with Dr. Georgina Snell, Sports Medicine for recheck of symptoms and continued management of lumbar fracture.      ED Prescriptions    Medication Sig Dispense Auth. Provider   calcitonin, salmon, (MIACALCIN/FORTICAL) 200 UNIT/ACT nasal spray Place 1 spray into alternate nostrils daily. 3.7 mL Gerarda Fraction, Lindsey Hommel O, PA-C   cyclobenzaprine (FLEXERIL) 10 MG tablet Take 1 tablet (10 mg total) by mouth 2 (two) times daily as needed. 20 tablet Noe Gens, PA-C     Controlled Substance Prescriptions [place] Controlled Substance Registry consulted? Not Applicable   Tyrell Antonio 04/19/18 1844

## 2018-04-19 NOTE — Discharge Instructions (Addendum)
°  You may use over the counter NSAIDs such as naproxen or ibuprofen for pain as well as the muscle relaxer (flexeril) for muscle spasms.  Flexeril (cyclobenzaprine) is a muscle relaxer and may cause drowsiness. Do not drink alcohol, drive, or operate heavy machinery while taking.  Please call to schedule an appointment next week with Dr. Georgina Snell, Sports Medicine for recheck of symptoms and continued management of lumbar fracture.

## 2018-04-25 DIAGNOSIS — S32040A Wedge compression fracture of fourth lumbar vertebra, initial encounter for closed fracture: Secondary | ICD-10-CM

## 2018-04-25 HISTORY — DX: Wedge compression fracture of fourth lumbar vertebra, initial encounter for closed fracture: S32.040A

## 2018-05-25 ENCOUNTER — Encounter: Payer: Self-pay | Admitting: Family Medicine

## 2018-05-25 ENCOUNTER — Ambulatory Visit (HOSPITAL_BASED_OUTPATIENT_CLINIC_OR_DEPARTMENT_OTHER)
Admission: RE | Admit: 2018-05-25 | Discharge: 2018-05-25 | Disposition: A | Discharge status: Home / Self Care | Payer: No Typology Code available for payment source | Source: Ambulatory Visit | Attending: Family Medicine | Admitting: Family Medicine

## 2018-05-25 ENCOUNTER — Other Ambulatory Visit: Payer: Self-pay

## 2018-05-25 ENCOUNTER — Ambulatory Visit (INDEPENDENT_AMBULATORY_CARE_PROVIDER_SITE_OTHER): Payer: No Typology Code available for payment source | Admitting: Family Medicine

## 2018-05-25 VITALS — BP 150/92 | HR 63 | Temp 97.9°F | Resp 16 | Ht 67.25 in | Wt 162.0 lb

## 2018-05-25 DIAGNOSIS — I10 Essential (primary) hypertension: Secondary | ICD-10-CM | POA: Diagnosis not present

## 2018-05-25 DIAGNOSIS — Z1239 Encounter for other screening for malignant neoplasm of breast: Secondary | ICD-10-CM | POA: Insufficient documentation

## 2018-05-25 DIAGNOSIS — Z0000 Encounter for general adult medical examination without abnormal findings: Secondary | ICD-10-CM | POA: Diagnosis not present

## 2018-05-25 DIAGNOSIS — M5441 Lumbago with sciatica, right side: Secondary | ICD-10-CM

## 2018-05-25 DIAGNOSIS — Z780 Asymptomatic menopausal state: Secondary | ICD-10-CM

## 2018-05-25 DIAGNOSIS — E7800 Pure hypercholesterolemia, unspecified: Secondary | ICD-10-CM | POA: Diagnosis not present

## 2018-05-25 DIAGNOSIS — E2839 Other primary ovarian failure: Secondary | ICD-10-CM

## 2018-05-25 DIAGNOSIS — M5442 Lumbago with sciatica, left side: Secondary | ICD-10-CM | POA: Diagnosis not present

## 2018-05-25 DIAGNOSIS — Z1211 Encounter for screening for malignant neoplasm of colon: Secondary | ICD-10-CM

## 2018-05-25 MED FILL — TRIAMTERENE-HCTZ 37.5-25 MG: 37.5-25 | 90 days supply | Qty: 90 | Fill #2

## 2018-05-25 MED FILL — ZOLPIDEM TARTRATE 10 MG TAB: 10 | 15 days supply | Qty: 15 | Fill #1

## 2018-05-25 NOTE — Progress Notes (Signed)
o

## 2018-05-25 NOTE — Patient Instructions (Addendum)
Recommend calcium intake of 1200 to 1500 mg daily, divided into roughly 3 doses. Best source is the diet and a single dairy serving is about 500 mg, a supplement of calcium citrate once or twice daily to balance diet is fine if not getting enough in diet. Also need Vitamin D 2000 IU caps, 1 cap daily if not already taking vitamin D. Also recommend weight baring exercise on hips and upper body to keep bones strong  Shingrix is the new shot 2 shots over 2-6 months, call insurance and confirm coverage then can return for nurse visit or at visit.   Preventive Care 40-64 Years, Female Preventive care refers to lifestyle choices and visits with your health care provider that can promote health and wellness. What does preventive care include?  A yearly physical exam. This is also called an annual well check.  Dental exams once or twice a year.  Routine eye exams. Ask your health care provider how often you should have your eyes checked.  Personal lifestyle choices, including: ? Daily care of your teeth and gums. ? Regular physical activity. ? Eating a healthy diet. ? Avoiding tobacco and drug use. ? Limiting alcohol use. ? Practicing safe sex. ? Taking low-dose aspirin daily starting at age 59. ? Taking vitamin and mineral supplements as recommended by your health care provider. What happens during an annual well check? The services and screenings done by your health care provider during your annual well check will depend on your age, overall health, lifestyle risk factors, and family history of disease. Counseling Your health care provider may ask you questions about your:  Alcohol use.  Tobacco use.  Drug use.  Emotional well-being.  Home and relationship well-being.  Sexual activity.  Eating habits.  Work and work Statistician.  Method of birth control.  Menstrual cycle.  Pregnancy history.  Screening You may have the following tests or measurements:  Height, weight,  and BMI.  Blood pressure.  Lipid and cholesterol levels. These may be checked every 5 years, or more frequently if you are over 46 years old.  Skin check.  Lung cancer screening. You may have this screening every year starting at age 107 if you have a 30-pack-year history of smoking and currently smoke or have quit within the past 15 years.  Fecal occult blood test (FOBT) of the stool. You may have this test every year starting at age 46.  Flexible sigmoidoscopy or colonoscopy. You may have a sigmoidoscopy every 5 years or a colonoscopy every 10 years starting at age 44.  Hepatitis C blood test.  Hepatitis B blood test.  Sexually transmitted disease (STD) testing.  Diabetes screening. This is done by checking your blood sugar (glucose) after you have not eaten for a while (fasting). You may have this done every 1-3 years.  Mammogram. This may be done every 1-2 years. Talk to your health care provider about when you should start having regular mammograms. This may depend on whether you have a family history of breast cancer.  BRCA-related cancer screening. This may be done if you have a family history of breast, ovarian, tubal, or peritoneal cancers.  Pelvic exam and Pap test. This may be done every 3 years starting at age 64. Starting at age 36, this may be done every 5 years if you have a Pap test in combination with an HPV test.  Bone density scan. This is done to screen for osteoporosis. You may have this scan if you are at  high risk for osteoporosis.  Discuss your test results, treatment options, and if necessary, the need for more tests with your health care provider. Vaccines Your health care provider may recommend certain vaccines, such as:  Influenza vaccine. This is recommended every year.  Tetanus, diphtheria, and acellular pertussis (Tdap, Td) vaccine. You may need a Td booster every 10 years.  Varicella vaccine. You may need this if you have not been  vaccinated.  Zoster vaccine. You may need this after age 53.  Measles, mumps, and rubella (MMR) vaccine. You may need at least one dose of MMR if you were born in 1957 or later. You may also need a second dose.  Pneumococcal 13-valent conjugate (PCV13) vaccine. You may need this if you have certain conditions and were not previously vaccinated.  Pneumococcal polysaccharide (PPSV23) vaccine. You may need one or two doses if you smoke cigarettes or if you have certain conditions.  Meningococcal vaccine. You may need this if you have certain conditions.  Hepatitis A vaccine. You may need this if you have certain conditions or if you travel or work in places where you may be exposed to hepatitis A.  Hepatitis B vaccine. You may need this if you have certain conditions or if you travel or work in places where you may be exposed to hepatitis B.  Haemophilus influenzae type b (Hib) vaccine. You may need this if you have certain conditions.  Talk to your health care provider about which screenings and vaccines you need and how often you need them. This information is not intended to replace advice given to you by your health care provider. Make sure you discuss any questions you have with your health care provider. Document Released: 07/25/2015 Document Revised: 03/17/2016 Document Reviewed: 04/29/2015 Elsevier Interactive Patient Education  [employer].

## 2018-05-26 MED ORDER — ALENDRONATE SODIUM 70 MG PO TABS: 70.0000 mg | ORAL_TABLET | ORAL | 4 refills | Status: DC

## 2018-05-28 DIAGNOSIS — M545 Low back pain, unspecified: Secondary | ICD-10-CM | POA: Insufficient documentation

## 2018-05-28 NOTE — Assessment & Plan Note (Addendum)
Patient encouraged to maintain heart healthy diet, regular exercise, adequate sleep. Consider daily probiotics. Take medications as prescribed. MGM and colonoscopy ordered.

## 2018-05-28 NOTE — Assessment & Plan Note (Signed)
Well controlled, no changes to meds. Encouraged heart healthy diet such as the DASH diet and exercise as tolerated.  °

## 2018-05-28 NOTE — Assessment & Plan Note (Signed)
With pain radiating to bilateral hips. Encouraged moist heat and gentle stretching as tolerated. May try NSAIDs and prescription meds as directed and report if symptoms worsen or seek immediate care

## 2018-05-28 NOTE — Assessment & Plan Note (Signed)
Encouraged heart healthy diet, increase exercise, avoid trans fats, consider a krill oil cap daily 

## 2018-05-28 NOTE — Progress Notes (Signed)
Subjective:    Patient ID: Ann Barton, female    DOB: [birth year], 62 y.o.   MRN: [id number]  Chief Complaint  Patient presents with  . Annual Exam    arthritis and back pain    HPI Patient is in today for ammual preventative exam and follow up on chronic medical concerns including hyperlipidemia, hypertension and more. No recent febrile illness or hospitalizations. No polyuria or polydipsia. Is having trouble with low back pain and intermittent bilateral hip pain also. No falls or trauma. She is managing her activities of daily living well. Tries to remain active and walks a good deal. Maintains a heart healthy diet most days. Denies CP/palp/SOB/HA/congestion/fevers/GI or GU c/o. Taking meds as prescribed  Past Medical History:  Diagnosis Date  . Allergy    seasonal  . Arthritis    left hip, low back  . Atrophic vaginitis 01/25/2011  . Bladder prolapse, female, acquired 05/16/2013  . Chicken pox as a child  . Compression fracture of fourth lumbar vertebra ([place]) 04/25/2018  . Cough 04/19/2012  . 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 Goiter   . Hypertension   . Measles as a child  . Mumps as a child  . Neutropenia 03/10/2011  . Other and unspecified hyperlipidemia 08/23/2013  . Other malaise and fatigue 08/26/2013  . Pharyngitis, acute 03/09/2011  . Urinary frequency 04/27/2011  . Vaginitis 04/27/2011    Past Surgical History:  Procedure Laterality Date  . BIOPSY THYROID     FNA, benign  . BREAST BIOPSY Left    FNA, benign  . CHOLECYSTECTOMY  1998  . HERNIA REPAIR Right    right, inguinal  . in grown toenails removed     partial, b/l  . laser surgery on vericose veins     b/l  . NASAL SEPTUM SURGERY  2015  . TONSILLECTOMY  as a child  . VAGINAL HYSTERECTOMY  1988   done for polapse    Family History  Problem Relation Age of Onset  . Atrial fibrillation Mother   . Obesity Mother   . Diabetes Mother 18       Type 2  . Arthritis Mother        in right knee  .  Heart disease Mother        atrial fibrillation, CHF  . Hernia Mother        never recovered from repair  . Hypertension Father   . Ulcers Father   . Pulmonary fibrosis Father   . Diabetes Maternal Grandmother        type 2?  . Heart attack Maternal Grandmother   . Hearing loss Maternal Grandmother        MI  . Parkinsonism Maternal Grandfather   . Stroke Paternal Grandmother     Social History   Socioeconomic History  . Marital status: Widowed    Spouse name: Not on file  . Number of children: 2  . Years of education: Not on file  . Highest education level: Not on file  Occupational History  . Occupation: NICU    Employer: [employer]  . Financial resource strain: Not on file  . Food insecurity:    Worry: Not on file    Inability: Not on file  . Transportation needs:    Medical: Not on file    Non-medical: Not on file  Tobacco Use  . Smoking status: Former Smoker    Packs/day: 1.00    Years: 5.00  Pack years: 5.00    Types: Cigarettes    Last attempt to quit: 07/12/1976    Years since quitting: 41.9  . Smokeless tobacco: Never Used  Substance and Sexual Activity  . Alcohol use: Yes    Comment: occasionally  . Drug use: No  . Sexual activity: Yes    Partners: Male    Comment: no dietary restrictions. but does minimize refined sugar  Lifestyle  . Physical activity:    Days per week: Not on file    Minutes per session: Not on file  . Stress: Not on file  Relationships  . Social connections:    Talks on phone: Not on file    Gets together: Not on file    Attends religious service: Not on file    Active member of club or organization: Not on file    Attends meetings of clubs or organizations: Not on file    Relationship status: Not on file  . Intimate partner violence:    Fear of current or ex partner: Not on file    Emotionally abused: Not on file    Physically abused: Not on file    Forced sexual activity: Not on file  Other Topics  Concern  . Not on file  Social History Narrative  . Not on file    Outpatient Medications Prior to Visit  Medication Sig Dispense Refill  . calcitonin, salmon, (MIACALCIN/FORTICAL) 200 UNIT/ACT nasal spray Place 1 spray into alternate nostrils daily. 3.7 mL 12  . Cholecalciferol (VITAMIN D3) 5000 UNITS CAPS Take 1 capsule by mouth daily.     . Coenzyme Q10 (COQ-10) 200 MG CAPS Take 1 tablet by mouth daily.     . cyclobenzaprine (FLEXERIL) 10 MG tablet Take 1 tablet (10 mg total) by mouth 2 (two) times daily as needed. 20 tablet 0  . diphenhydrAMINE (BENADRYL) 25 mg capsule Take 25 mg by mouth as needed for itching or sleep.    . metoprolol succinate (TOPROL-XL) 100 MG 24 hr tablet Take 1 tablet (100 mg total) by mouth daily. Take with or immediately following a meal. 90 tablet 3  . Multiple Vitamin (MULTIVITAMIN) tablet Take 1 tablet by mouth daily.      . Nutritional Supplements (GRAPESEED EXTRACT PO) Take by mouth daily.    . Probiotic Product (PROBIOTIC DAILY PO) Take 1 tablet by mouth daily.     Marland Kitchen triamterene-hydrochlorothiazide (MAXZIDE-25) 37.5-25 MG tablet Take 1 tablet by mouth daily. 90 tablet 3  . zolpidem (AMBIEN) 10 MG tablet Take 1 tablet (10 mg total) by mouth at bedtime as needed for sleep. 15 tablet 1  . cefdinir (OMNICEF) 300 MG capsule Take 1 capsule (300 mg total) by mouth 2 (two) times daily. (Patient not taking: Reported on 05/25/2018) 10 capsule 0  . HAWTHORN PO Take 500 mg by mouth daily.    Marland Kitchen Ketotifen Fumarate (ALLERGY EYE DROPS OP) Apply to eye. 3 gtts,daily,SL     No facility-administered medications prior to visit.     Allergies  Allergen Reactions  . Lisinopril Cough    Review of Systems  Constitutional: Negative for chills, fever and malaise/fatigue.  HENT: Negative for congestion and hearing loss.   Eyes: Negative for discharge.  Respiratory: Negative for cough, sputum production and shortness of breath.   Cardiovascular: Negative for chest pain,  palpitations and leg swelling.  Gastrointestinal: Negative for abdominal pain, blood in stool, constipation, diarrhea, heartburn, nausea and vomiting.  Genitourinary: Negative for dysuria, frequency, hematuria and urgency.  Musculoskeletal: Positive for back pain and joint pain. Negative for falls and myalgias.  Skin: Negative for rash.  Neurological: Negative for dizziness, sensory change, loss of consciousness, weakness and headaches.  Endo/Heme/Allergies: Negative for environmental allergies. Does not bruise/bleed easily.  Psychiatric/Behavioral: Negative for depression and suicidal ideas. The patient is not nervous/anxious and does not have insomnia.        Objective:    Physical Exam  Constitutional: She is oriented to person, place, and time. She appears well-developed and well-nourished. No distress.  HENT:  Head: Normocephalic and atraumatic.  Eyes: Conjunctivae are normal.  Neck: Neck supple. No thyromegaly present.  Cardiovascular: Normal rate, regular rhythm and normal heart sounds.  No murmur heard. Pulmonary/Chest: Effort normal and breath sounds normal. No respiratory distress.  Abdominal: Soft. Bowel sounds are normal. She exhibits no distension and no mass. There is no tenderness.  Musculoskeletal: She exhibits no edema.  Lymphadenopathy:    She has no cervical adenopathy.  Neurological: She is alert and oriented to person, place, and time.  Skin: Skin is warm and dry.  Psychiatric: She has a normal mood and affect. Her behavior is normal.    BP (!) 150/92 (BP Location: Right Arm, Patient Position: Sitting, Cuff Size: Normal)   Pulse 63   Temp 97.9 F (36.6 C) (Oral)   Resp 16   Ht 5' 7.25" (1.708 m)   Wt 162 lb (73.5 kg)   SpO2 97%   BMI 25.18 kg/m  Wt Readings from Last 3 Encounters:  05/25/18 162 lb (73.5 kg)  04/19/18 164 lb (74.4 kg)  01/11/18 161 lb (73 kg)     Lab Results  Component Value Date   WBC 6.0 11/17/2017   HGB 12.4 11/17/2017   HCT  36.2 11/17/2017   PLT 246.0 11/17/2017   GLUCOSE 94 11/17/2017   CHOL 149 11/17/2017   TRIG 104.0 11/17/2017   HDL 48.30 11/17/2017   LDLCALC 79 11/17/2017   ALT 10 11/17/2017   AST 14 11/17/2017   NA 138 11/17/2017   K 3.9 11/17/2017   CL 101 11/17/2017   CREATININE 0.66 11/17/2017   BUN 11 11/17/2017   CO2 31 11/17/2017   TSH 0.72 11/17/2017    Lab Results  Component Value Date   TSH 0.72 11/17/2017   Lab Results  Component Value Date   WBC 6.0 11/17/2017   HGB 12.4 11/17/2017   HCT 36.2 11/17/2017   MCV 88.4 11/17/2017   PLT 246.0 11/17/2017   Lab Results  Component Value Date   NA 138 11/17/2017   K 3.9 11/17/2017   CO2 31 11/17/2017   GLUCOSE 94 11/17/2017   BUN 11 11/17/2017   CREATININE 0.66 11/17/2017   BILITOT 0.5 11/17/2017   ALKPHOS 45 11/17/2017   AST 14 11/17/2017   ALT 10 11/17/2017   PROT 7.0 11/17/2017   ALBUMIN 4.1 11/17/2017   CALCIUM 9.5 11/17/2017   GFR 96.29 11/17/2017   Lab Results  Component Value Date   CHOL 149 11/17/2017   Lab Results  Component Value Date   HDL 48.30 11/17/2017   Lab Results  Component Value Date   LDLCALC 79 11/17/2017   Lab Results  Component Value Date   TRIG 104.0 11/17/2017   Lab Results  Component Value Date   CHOLHDL 3 11/17/2017   No results found for: HGBA1C     Assessment & Plan:   Problem List Items Addressed This Visit    Hypertension    Well controlled,  no changes to meds. Encouraged heart healthy diet such as the DASH diet and exercise as tolerated.       High cholesterol    Encouraged heart healthy diet, increase exercise, avoid trans fats, consider a krill oil cap daily      Preventative health care    Patient encouraged to maintain heart healthy diet, regular exercise, adequate sleep. Consider daily probiotics. Take medications as prescribed. MGM and colonoscopy ordered.       Low back pain    With pain radiating to bilateral hips. Encouraged moist heat and gentle  stretching as tolerated. May try NSAIDs and prescription meds as directed and report if symptoms worsen or seek immediate care       Other Visit Diagnoses    Breast cancer screening    -  Primary   Relevant Orders   MM 3D SCREEN BREAST BILATERAL (Completed)   Post-menopausal       Relevant Orders   DG Bone Density (Completed)   Estrogen deficiency       Relevant Orders   DG Bone Density (Completed)   Colon cancer screening       Relevant Orders   Cologuard      I have discontinued Preslie L. Ambers's HAWTHORN PO, Ketotifen Fumarate (ALLERGY EYE DROPS OP), and cefdinir. I am also having her start on alendronate. Additionally, I am having her maintain her multivitamin, Vitamin D3, Probiotic Product (PROBIOTIC DAILY PO), CoQ-10, diphenhydrAMINE, Nutritional Supplements (GRAPESEED EXTRACT PO), metoprolol succinate, triamterene-hydrochlorothiazide, zolpidem, calcitonin (salmon), and cyclobenzaprine.  Meds ordered this encounter  Medications  . alendronate (FOSAMAX) 70 MG tablet    Sig: Take 1 tablet (70 mg total) by mouth every 7 (seven) days. Take with a full glass of water on an empty stomach.    Dispense:  4 tablet    Refill:  4     Penni Homans, MD

## 2018-05-30 ENCOUNTER — Telehealth: Payer: Self-pay | Admitting: *Deleted

## 2018-05-30 LAB — COLOGUARD: Cologuard: NEGATIVE

## 2018-05-30 NOTE — Telephone Encounter (Signed)
Received Cologuard Incomplete Order: [id number]; order has been faxed back d/t missing information necessary to send patient a Cologuard kit. [employer] has made several attempts to reach patient by phone and has not been able to reach them; forwarded to provider/SLS 11/19

## 2018-05-31 ENCOUNTER — Telehealth: Payer: Self-pay | Admitting: Family Medicine

## 2018-05-31 NOTE — Telephone Encounter (Signed)
Copied from [place] [id number]. Topic: Quick Communication - See Telephone Encounter >> May 31, 2018  2:48 PM Rosalin Hawking wrote: CRM for notification. See Telephone encounter for: 05/31/18.   Pt dropped off document to be filled out by provider, Scientist, physiological certification form -8 pages) Pt would like document to be faxed to [phone] when document ready. Document put at front office tray under providers name.

## 2018-06-01 ENCOUNTER — Encounter: Payer: Self-pay | Admitting: Family Medicine

## 2018-06-07 LAB — COLOGUARD: COLOGUARD: NEGATIVE

## 2018-06-12 NOTE — Telephone Encounter (Signed)
Received FMLA/STD paperwork from [employer], completed as much as possible; forwarded to provider/SLS 12/02  Received LOA/ADA Accommodation paperwork from [employer], completed as much as possible; forwarded to provider/SLS 12/02

## 2018-06-14 ENCOUNTER — Telehealth: Payer: Self-pay | Admitting: *Deleted

## 2018-06-14 NOTE — Telephone Encounter (Signed)
Received Cologuard Results; forwarded to provider/SLS 12/04

## 2018-06-15 ENCOUNTER — Encounter: Payer: Self-pay | Admitting: Family Medicine

## 2018-06-15 NOTE — Telephone Encounter (Signed)
Pt called in to check the status of her paperwork. Pt says that she is suppose to have paperwork faxed today.   Please advise.   [phone]

## 2018-06-15 NOTE — Telephone Encounter (Signed)
Left detailed message for patient letting her know that Ivin Booty our paperwork person has completed and  faxed over her paperwork for her.  Triage may handle if she calls back

## 2018-06-22 ENCOUNTER — Encounter: Payer: Self-pay | Admitting: Family Medicine

## 2018-06-27 MED FILL — ALENDRONATE NA 70 MG TAB: 70 | 28 days supply | Qty: 4 | Fill #0

## 2018-07-18 ENCOUNTER — Other Ambulatory Visit: Payer: Self-pay | Admitting: Family Medicine

## 2018-07-18 MED FILL — ALENDRONATE NA 70 MG TAB: 70 | 28 days supply | Qty: 4 | Fill #1

## 2018-07-19 MED FILL — METOPROLOL SUCCINATE ER 100: 100 | 90 days supply | Qty: 90 | Fill #0

## 2018-09-26 ENCOUNTER — Ambulatory Visit: Payer: No Typology Code available for payment source | Admitting: Family Medicine

## 2018-11-02 ENCOUNTER — Other Ambulatory Visit: Payer: Self-pay | Admitting: Family Medicine

## 2018-11-02 MED FILL — METOPROLOL SUCCINATE ER 100: 100 | 90 days supply | Qty: 90 | Fill #1

## 2018-11-03 NOTE — Telephone Encounter (Signed)
Requesting:ambien  Contract:yes UDS:n/a Last OV:05/25/18 Next OV:11/06/18 Last Refill:03/27/18  #[id number] Database:   Please advise

## 2018-11-06 ENCOUNTER — Ambulatory Visit: Payer: No Typology Code available for payment source | Admitting: Family Medicine

## 2018-11-06 ENCOUNTER — Other Ambulatory Visit: Payer: Self-pay

## 2018-11-06 MED FILL — ZOLPIDEM TARTRATE 10 MG TAB: 10 | 15 days supply | Qty: 15 | Fill #0

## 2018-11-07 ENCOUNTER — Other Ambulatory Visit: Payer: Self-pay

## 2018-11-07 ENCOUNTER — Ambulatory Visit (INDEPENDENT_AMBULATORY_CARE_PROVIDER_SITE_OTHER): Payer: No Typology Code available for payment source | Admitting: Family Medicine

## 2018-11-07 DIAGNOSIS — S32040D Wedge compression fracture of fourth lumbar vertebra, subsequent encounter for fracture with routine healing: Secondary | ICD-10-CM | POA: Diagnosis not present

## 2018-11-07 DIAGNOSIS — I10 Essential (primary) hypertension: Secondary | ICD-10-CM | POA: Diagnosis not present

## 2018-11-07 DIAGNOSIS — S32040A Wedge compression fracture of fourth lumbar vertebra, initial encounter for closed fracture: Secondary | ICD-10-CM | POA: Insufficient documentation

## 2018-11-07 MED ORDER — ZOLPIDEM TARTRATE 10 MG PO TABS: 10.0000 mg | ORAL_TABLET | Freq: Every evening | ORAL | 1 refills | Status: DC | PRN

## 2018-11-07 NOTE — Assessment & Plan Note (Signed)
Is slowly doing much better, her back pain and radicular symptoms are much better she will be returned to work in 6 weeks to allow for complete healing. Note provided.

## 2018-11-07 NOTE — Assessment & Plan Note (Signed)
no changes to meds. Encouraged heart healthy diet such as the DASH diet and exercise as tolerated.  

## 2018-11-07 NOTE — Progress Notes (Signed)
Virtual Visit via Video Note  I connected with Ann Barton on 11/07/18 at  3:00 PM EDT by a video enabled telemedicine application and verified that I am speaking with the correct person using two identifiers.   I discussed the limitations of evaluation and management by telemedicine and the availability of in person appointments. The patient expressed understanding and agreed to proceed. Princess Eulas Post CMA was able to get patient set up on video platform.    Subjective:    Patient ID: Ann Barton, female    DOB: September 23, 1955, 63 y.o.   MRN: [id number]  No chief complaint on file.   HPI Patient is in today for follow up on back pain and hypertension. She feels well today. No recent febrile illness or hospitalizations. Her low back pain with radicular features caused by her L4 compression fracture is improving and she feels she is close to ready to go back to her 12 hour work shifts. Denies CP/palp/SOB/HA/congestion/fevers/GI or GU c/o. Taking meds as prescribed  Past Medical History:  Diagnosis Date  . Allergy    seasonal  . Arthritis    left hip, low back  . Atrophic vaginitis 01/25/2011  . Bladder prolapse, female, acquired 05/16/2013  . Chicken pox as a child  . Compression fracture of fourth lumbar vertebra ([place]) 04/25/2018  . Cough 04/19/2012  . 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 Goiter   . Hypertension   . Measles as a child  . Mumps as a child  . Neutropenia 03/10/2011  . Other and unspecified hyperlipidemia 08/23/2013  . Other malaise and fatigue 08/26/2013  . Pharyngitis, acute 03/09/2011  . Urinary frequency 04/27/2011  . Vaginitis 04/27/2011    Past Surgical History:  Procedure Laterality Date  . BIOPSY THYROID     FNA, benign  . BREAST BIOPSY Left    FNA, benign  . CHOLECYSTECTOMY  1998  . HERNIA REPAIR Right    right, inguinal  . in grown toenails removed     partial, b/l  . laser surgery on vericose veins     b/l  . NASAL SEPTUM SURGERY  2015  .  TONSILLECTOMY  as a child  . VAGINAL HYSTERECTOMY  1988   done for polapse    Family History  Problem Relation Age of Onset  . Atrial fibrillation Mother   . Obesity Mother   . Diabetes Mother 8       Type 2  . Arthritis Mother        in right knee  . Heart disease Mother        atrial fibrillation, CHF  . Hernia Mother        never recovered from repair  . Hypertension Father   . Ulcers Father   . Pulmonary fibrosis Father   . Diabetes Maternal Grandmother        type 2?  . Heart attack Maternal Grandmother   . Hearing loss Maternal Grandmother        MI  . Parkinsonism Maternal Grandfather   . Stroke Paternal Grandmother     Social History   Socioeconomic History  . Marital status: Widowed    Spouse name: Not on file  . Number of children: 2  . Years of education: Not on file  . Highest education level: Not on file  Occupational History  . Occupation: NICU    Employer: [employer]  . Financial resource strain: Not on file  . Food insecurity:  Worry: Not on file    Inability: Not on file  . Transportation needs:    Medical: Not on file    Non-medical: Not on file  Tobacco Use  . Smoking status: Former Smoker    Packs/day: 1.00    Years: 5.00    Pack years: 5.00    Types: Cigarettes    Last attempt to quit: 07/12/1976    Years since quitting: 42.3  . Smokeless tobacco: Never Used  Substance and Sexual Activity  . Alcohol use: Yes    Comment: occasionally  . Drug use: No  . Sexual activity: Yes    Partners: Male    Comment: no dietary restrictions. but does minimize refined sugar  Lifestyle  . Physical activity:    Days per week: Not on file    Minutes per session: Not on file  . Stress: Not on file  Relationships  . Social connections:    Talks on phone: Not on file    Gets together: Not on file    Attends religious service: Not on file    Active member of club or organization: Not on file    Attends meetings of clubs or  organizations: Not on file    Relationship status: Not on file  . Intimate partner violence:    Fear of current or ex partner: Not on file    Emotionally abused: Not on file    Physically abused: Not on file    Forced sexual activity: Not on file  Other Topics Concern  . Not on file  Social History Narrative  . Not on file    Outpatient Medications Prior to Visit  Medication Sig Dispense Refill  . alendronate (FOSAMAX) 70 MG tablet Take 1 tablet (70 mg total) by mouth every 7 (seven) days. Take with a full glass of water on an empty stomach. 4 tablet 4  . calcitonin, salmon, (MIACALCIN/FORTICAL) 200 UNIT/ACT nasal spray Place 1 spray into alternate nostrils daily. 3.7 mL 12  . Cholecalciferol (VITAMIN D3) 5000 UNITS CAPS Take 1 capsule by mouth daily.     . Coenzyme Q10 (COQ-10) 200 MG CAPS Take 1 tablet by mouth daily.     . cyclobenzaprine (FLEXERIL) 10 MG tablet Take 1 tablet (10 mg total) by mouth 2 (two) times daily as needed. 20 tablet 0  . diphenhydrAMINE (BENADRYL) 25 mg capsule Take 25 mg by mouth as needed for itching or sleep.    . metoprolol succinate (TOPROL-XL) 100 MG 24 hr tablet TAKE 1 TABLET (100 MG TOTAL) BY MOUTH DAILY. TAKE WITH OR IMMEDIATELY FOLLOWING A MEAL. 90 tablet 3  . Multiple Vitamin (MULTIVITAMIN) tablet Take 1 tablet by mouth daily.      . Nutritional Supplements (GRAPESEED EXTRACT PO) Take by mouth daily.    . Probiotic Product (PROBIOTIC DAILY PO) Take 1 tablet by mouth daily.     Marland Kitchen triamterene-hydrochlorothiazide (MAXZIDE-25) 37.5-25 MG tablet Take 1 tablet by mouth daily. 90 tablet 3  . zolpidem (AMBIEN) 10 MG tablet TAKE 1 TABLET BY MOUTH AT BEDTIME AS NEEDED FOR SLEEP 15 tablet 1   No facility-administered medications prior to visit.     Allergies  Allergen Reactions  . Lisinopril Cough    Review of Systems  Constitutional: Negative for fever and malaise/fatigue.  HENT: Negative for congestion.   Eyes: Negative for blurred vision.   Respiratory: Negative for shortness of breath.   Cardiovascular: Negative for chest pain, palpitations and leg swelling.  Gastrointestinal: Negative for abdominal  pain, blood in stool and nausea.  Genitourinary: Negative for dysuria and frequency.  Musculoskeletal: Negative for falls.  Skin: Negative for rash.  Neurological: Negative for dizziness, loss of consciousness and headaches.  Endo/Heme/Allergies: Negative for environmental allergies.  Psychiatric/Behavioral: Negative for depression. The patient is not nervous/anxious.        Objective:    Physical Exam Constitutional:      Appearance: Normal appearance. She is not ill-appearing.  HENT:     Head: Normocephalic and atraumatic.     Nose: Nose normal.  Pulmonary:     Effort: Pulmonary effort is normal.  Neurological:     Mental Status: She is alert and oriented to person, place, and time.  Psychiatric:        Mood and Affect: Mood normal.        Behavior: Behavior normal.     There were no vitals taken for this visit. Wt Readings from Last 3 Encounters:  05/25/18 162 lb (73.5 kg)  04/19/18 164 lb (74.4 kg)  01/11/18 161 lb (73 kg)    Diabetic Foot Exam - Simple   No data filed     Lab Results  Component Value Date   WBC 6.0 11/17/2017   HGB 12.4 11/17/2017   HCT 36.2 11/17/2017   PLT 246.0 11/17/2017   GLUCOSE 94 11/17/2017   CHOL 149 11/17/2017   TRIG 104.0 11/17/2017   HDL 48.30 11/17/2017   LDLCALC 79 11/17/2017   ALT 10 11/17/2017   AST 14 11/17/2017   NA 138 11/17/2017   K 3.9 11/17/2017   CL 101 11/17/2017   CREATININE 0.66 11/17/2017   BUN 11 11/17/2017   CO2 31 11/17/2017   TSH 0.72 11/17/2017    Lab Results  Component Value Date   TSH 0.72 11/17/2017   Lab Results  Component Value Date   WBC 6.0 11/17/2017   HGB 12.4 11/17/2017   HCT 36.2 11/17/2017   MCV 88.4 11/17/2017   PLT 246.0 11/17/2017   Lab Results  Component Value Date   NA 138 11/17/2017   K 3.9 11/17/2017   CO2  31 11/17/2017   GLUCOSE 94 11/17/2017   BUN 11 11/17/2017   CREATININE 0.66 11/17/2017   BILITOT 0.5 11/17/2017   ALKPHOS 45 11/17/2017   AST 14 11/17/2017   ALT 10 11/17/2017   PROT 7.0 11/17/2017   ALBUMIN 4.1 11/17/2017   CALCIUM 9.5 11/17/2017   GFR 96.29 11/17/2017   Lab Results  Component Value Date   CHOL 149 11/17/2017   Lab Results  Component Value Date   HDL 48.30 11/17/2017   Lab Results  Component Value Date   LDLCALC 79 11/17/2017   Lab Results  Component Value Date   TRIG 104.0 11/17/2017   Lab Results  Component Value Date   CHOLHDL 3 11/17/2017   No results found for: HGBA1C     Assessment & Plan:   Problem List Items Addressed This Visit    Hypertension    no changes to meds. Encouraged heart healthy diet such as the DASH diet and exercise as tolerated.       Compression fracture of L4 vertebra ([facility])    Is slowly doing much better, her back pain and radicular symptoms are much better she will be returned to work in 6 weeks to allow for complete healing. Note provided.          I have changed Genevie L. Arnaud's zolpidem. I am also having her maintain her multivitamin,  Vitamin D3, Probiotic Product (PROBIOTIC DAILY PO), CoQ-10, diphenhydrAMINE, Nutritional Supplements (GRAPESEED EXTRACT PO), triamterene-hydrochlorothiazide, calcitonin (salmon), cyclobenzaprine, alendronate, and metoprolol succinate.  Meds ordered this encounter  Medications  . zolpidem (AMBIEN) 10 MG tablet    Sig: Take 1 tablet (10 mg total) by mouth at bedtime as needed. for sleep    Dispense:  15 tablet    Refill:  1    I discussed the assessment and treatment plan with the patient. The patient was provided an opportunity to ask questions and all were answered. The patient agreed with the plan and demonstrated an understanding of the instructions.   The patient was advised to call back or seek an in-person evaluation if the symptoms worsen or if the condition fails to  improve as anticipated.  I provided 20 minutes of non-face-to-face time during this encounter.   Penni Homans, MD

## 2019-02-06 MED FILL — METOPROLOL SUCCINATE ER 100: 100 | 90 days supply | Qty: 90 | Fill #2

## 2019-02-06 MED FILL — ZOLPIDEM TARTRATE 10 MG TAB: 10 | 15 days supply | Qty: 15 | Fill #1

## 2019-04-06 ENCOUNTER — Telehealth: Payer: Self-pay

## 2019-04-06 NOTE — Telephone Encounter (Signed)
Copied from [place] [id number]. Topic: General - Other >> Apr 03, 2019  2:41 PM Carolyn Stare wrote:  Pt would like a call back to scheduel her CPE   Can you call patient to get her CPE done for next year

## 2019-04-06 NOTE — Telephone Encounter (Signed)
scheduled

## 2019-05-21 ENCOUNTER — Other Ambulatory Visit: Payer: Self-pay | Admitting: Family Medicine

## 2019-05-21 MED FILL — METOPROLOL SUCCINATE ER 100: 100 | 90 days supply | Qty: 90 | Fill #3

## 2019-05-21 NOTE — Telephone Encounter (Signed)
Last written: 11/07/18 Last ov: 11/07/18 Next ov: 08/28/19 Contract: will get at next appt UDS: will get at next appt

## 2019-05-22 MED FILL — ZOLPIDEM TARTRATE 10 MG TAB: 10 | 15 days supply | Qty: 15 | Fill #0

## 2019-08-27 ENCOUNTER — Other Ambulatory Visit: Payer: Self-pay

## 2019-08-28 ENCOUNTER — Other Ambulatory Visit: Payer: Self-pay

## 2019-08-28 ENCOUNTER — Ambulatory Visit (INDEPENDENT_AMBULATORY_CARE_PROVIDER_SITE_OTHER): Payer: No Typology Code available for payment source | Admitting: Family Medicine

## 2019-08-28 ENCOUNTER — Encounter: Payer: Self-pay | Admitting: Family Medicine

## 2019-08-28 VITALS — BP 157/97 | HR 60 | Temp 96.7°F | Resp 16 | Ht 67.0 in | Wt 154.0 lb

## 2019-08-28 DIAGNOSIS — Z0000 Encounter for general adult medical examination without abnormal findings: Secondary | ICD-10-CM | POA: Diagnosis not present

## 2019-08-28 DIAGNOSIS — H9313 Tinnitus, bilateral: Secondary | ICD-10-CM

## 2019-08-28 DIAGNOSIS — I10 Essential (primary) hypertension: Secondary | ICD-10-CM

## 2019-08-28 DIAGNOSIS — R0981 Nasal congestion: Secondary | ICD-10-CM | POA: Diagnosis not present

## 2019-08-28 DIAGNOSIS — R0989 Other specified symptoms and signs involving the circulatory and respiratory systems: Secondary | ICD-10-CM | POA: Insufficient documentation

## 2019-08-28 DIAGNOSIS — G4700 Insomnia, unspecified: Secondary | ICD-10-CM

## 2019-08-28 DIAGNOSIS — M810 Age-related osteoporosis without current pathological fracture: Secondary | ICD-10-CM | POA: Insufficient documentation

## 2019-08-28 DIAGNOSIS — H938X3 Other specified disorders of ear, bilateral: Secondary | ICD-10-CM | POA: Diagnosis not present

## 2019-08-28 DIAGNOSIS — R252 Cramp and spasm: Secondary | ICD-10-CM | POA: Diagnosis not present

## 2019-08-28 DIAGNOSIS — E7800 Pure hypercholesterolemia, unspecified: Secondary | ICD-10-CM

## 2019-08-28 LAB — LIPID PANEL
Cholesterol: 190 mg/dL (ref 0–200)
HDL: 50.5 mg/dL (ref >39.00)
LDL Cholesterol: 112 mg/dL — ABNORMAL HIGH (ref 0–99)
NonHDL: 139.95
Total CHOL/HDL Ratio: 4
Triglycerides: 138 mg/dL (ref 0.0–149.0)
VLDL: 27.6 mg/dL (ref 0.0–40.0)

## 2019-08-28 LAB — COMPREHENSIVE METABOLIC PANEL
ALT: 9 U/L (ref 0–35)
AST: 13 U/L (ref 0–37)
Albumin: 4.5 g/dL (ref 3.5–5.2)
Alkaline Phosphatase: 47 U/L (ref 39–117)
BUN: 20 mg/dL (ref 6–23)
CO2: 30 mEq/L (ref 19–32)
Calcium: 9.7 mg/dL (ref 8.4–10.5)
Chloride: 101 mEq/L (ref 96–112)
Creatinine, Ser: 0.74 mg/dL (ref 0.40–1.20)
GFR: 78.94 mL/min (ref >60.00)
Glucose, Bld: 95 mg/dL (ref 70–99)
Potassium: 3.6 mEq/L (ref 3.5–5.1)
Sodium: 137 mEq/L (ref 135–145)
Total Bilirubin: 0.4 mg/dL (ref 0.2–1.2)
Total Protein: 7.2 g/dL (ref 6.0–8.3)

## 2019-08-28 LAB — CBC
HCT: 37.5 % (ref 36.0–46.0)
Hemoglobin: 12.5 g/dL (ref 12.0–15.0)
MCHC: 33.4 g/dL (ref 30.0–36.0)
MCV: 86.6 fl (ref 78.0–100.0)
Platelets: 207 10*3/uL (ref 150.0–400.0)
RBC: 4.33 Mil/uL (ref 3.87–5.11)
RDW: 13.9 % (ref 11.5–15.5)
WBC: 5.3 10*3/uL (ref 4.0–10.5)

## 2019-08-28 LAB — TSH: TSH: 1.28 u[IU]/mL (ref 0.35–4.50)

## 2019-08-28 LAB — MAGNESIUM: Magnesium: 2.1 mg/dL (ref 1.5–2.5)

## 2019-08-28 MED ORDER — METOPROLOL SUCCINATE ER 100 MG PO TB24: 100.0000 mg | ORAL_TABLET | Freq: Every day | ORAL | 1 refills | Status: DC

## 2019-08-28 MED ORDER — ZOLPIDEM TARTRATE 10 MG PO TABS: 10.0000 mg | ORAL_TABLET | Freq: Every evening | ORAL | 5 refills | Status: DC | PRN

## 2019-08-28 MED FILL — METOPROLOL SUCCINATE ER 100: 100 | 90 days supply | Qty: 90 | Fill #0

## 2019-08-28 MED FILL — ZOLPIDEM TARTRATE 10 MG TAB: 10 | 15 days supply | Qty: 15 | Fill #0

## 2019-08-28 NOTE — Assessment & Plan Note (Signed)
Check carotid dopplers

## 2019-08-28 NOTE — Assessment & Plan Note (Signed)
Encouraged good sleep hygiene such as dark, quiet room. No blue/green glowing lights such as computer screens in bedroom. No alcohol or stimulants in evening. Cut down on caffeine as able. Regular exercise is helpful but not just prior to bed time. 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

## 2019-08-28 NOTE — Patient Instructions (Addendum)
[employer] Blood Pressure cuff, upper arm, want BP 100-140/60-90 Pulse oximeter, want oxygen in 90s  Weekly vitals  Take Multivitamin with minerals, selenium Vitamin D 1000-2000 IU daily Probiotic with lactobacillus and bifidophilus Asprin EC 81 mg daily  Melatonin 2-5 mg at bedtime  [email or web address]  Magnesium glycinate 200-400 mg at bedtime Preventive Care 104-65 Years Old, Female Preventive care refers to visits with your health care provider and lifestyle choices that can promote health and wellness. This includes:  A yearly physical exam. This may also be called an annual well check.  Regular dental visits and eye exams.  Immunizations.  Screening for certain conditions.  Healthy lifestyle choices, such as eating a healthy diet, getting regular exercise, not using drugs or products that contain nicotine and tobacco, and limiting alcohol use. What can I expect for my preventive care visit? Physical exam Your health care provider will check your:  Height and weight. This may be used to calculate body mass index (BMI), which tells if you are at a healthy weight.  Heart rate and blood pressure.  Skin for abnormal spots. Counseling Your health care provider may ask you questions about your:  Alcohol, tobacco, and drug use.  Emotional well-being.  Home and relationship well-being.  Sexual activity.  Eating habits.  Work and work Statistician.  Method of birth control.  Menstrual cycle.  Pregnancy history. What immunizations do I need?  Influenza (flu) vaccine  This is recommended every year. Tetanus, diphtheria, and pertussis (Tdap) vaccine  You may need a Td booster every 10 years. Varicella (chickenpox) vaccine  You may need this if you have not been vaccinated. Zoster (shingles) vaccine  You may need this after age 35. Measles, mumps, and rubella (MMR) vaccine  You may need at least one dose of MMR if you were  born in 1957 or later. You may also need a second dose. Pneumococcal conjugate (PCV13) vaccine  You may need this if you have certain conditions and were not previously vaccinated. Pneumococcal polysaccharide (PPSV23) vaccine  You may need one or two doses if you smoke cigarettes or if you have certain conditions. Meningococcal conjugate (MenACWY) vaccine  You may need this if you have certain conditions. Hepatitis A vaccine  You may need this if you have certain conditions or if you travel or work in places where you may be exposed to hepatitis A. Hepatitis B vaccine  You may need this if you have certain conditions or if you travel or work in places where you may be exposed to hepatitis B. Haemophilus influenzae type b (Hib) vaccine  You may need this if you have certain conditions. Human papillomavirus (HPV) vaccine  If recommended by your health care provider, you may need three doses over 6 months. You may receive vaccines as individual doses or as more than one vaccine together in one shot (combination vaccines). Talk with your health care provider about the risks and benefits of combination vaccines. What tests do I need? Blood tests  Lipid and cholesterol levels. These may be checked every 5 years, or more frequently if you are over 50 years old.  Hepatitis C test.  Hepatitis B test. Screening  Lung cancer screening. You may have this screening every year starting at age 52 if you have a 30-pack-year history of smoking and currently smoke or have quit within the past 15 years.  Colorectal cancer screening. All adults should have this screening starting at age 65 and continuing until age 33. Your health care  provider may recommend screening at age 65 if you are at increased risk. You will have tests every 1-10 years, depending on your results and the type of screening test.  Diabetes screening. This is done by checking your blood sugar (glucose) after you have not eaten  for a while (fasting). You may have this done every 1-3 years.  Mammogram. This may be done every 1-2 years. Talk with your health care provider about when you should start having regular mammograms. This may depend on whether you have a family history of breast cancer.  BRCA-related cancer screening. This may be done if you have a family history of breast, ovarian, tubal, or peritoneal cancers.  Pelvic exam and Pap test. This may be done every 3 years starting at age 13. Starting at age 108, this may be done every 5 years if you have a Pap test in combination with an HPV test. Other tests  Sexually transmitted disease (STD) testing.  Bone density scan. This is done to screen for osteoporosis. You may have this scan if you are at high risk for osteoporosis. Follow these instructions at home: Eating and drinking  Eat a diet that includes fresh fruits and vegetables, whole grains, lean protein, and low-fat dairy.  Take vitamin and mineral supplements as recommended by your health care provider.  Do not drink alcohol if: ? Your health care provider tells you not to drink. ? You are pregnant, may be pregnant, or are planning to become pregnant.  If you drink alcohol: ? Limit how much you have to 0-1 drink a day. ? Be aware of how much alcohol is in your drink. In the [place] one drink equals one 12 oz bottle of beer (355 mL), one 5 oz glass of wine (148 mL), or one 1 oz glass of hard liquor (44 mL). Lifestyle  Take daily care of your teeth and gums.  Stay active. Exercise for at least 30 minutes on 5 or more days each week.  Do not use any products that contain nicotine or tobacco, such as cigarettes, e-cigarettes, and chewing tobacco. If you need help quitting, ask your health care provider.  If you are sexually active, practice safe sex. Use a condom or other form of birth control (contraception) in order to prevent pregnancy and STIs (sexually transmitted infections).  If told by  your health care provider, take low-dose aspirin daily starting at age 65. What's next?  Visit your health care provider once a year for a well check visit.  Ask your health care provider how often you should have your eyes and teeth checked.  Stay up to date on all vaccines. This information is not intended to replace advice given to you by your health care provider. Make sure you discuss any questions you have with your health care provider. Document Revised: 03/09/2018 Document Reviewed: 03/09/2018 Elsevier Patient Education  2020 [employer].

## 2019-08-28 NOTE — Assessment & Plan Note (Signed)
Encouraged heart healthy diet, increase exercise, avoid trans fats, consider a krill oil cap daily 

## 2019-08-28 NOTE — Progress Notes (Signed)
Subjective:    Patient ID: Ann Barton, female    DOB: [birth year], 64 y.o.   MRN: [id number]  Chief Complaint  Patient presents with  . Annual Exam    doing well    HPI Patient is in today for annual preventative exam. She has been working in the NICU on weekends during the pandemic, she has had her COVID shots and tolerated them well. No recent febrile illness or hospitalizations. She is noting persistent trouble with insomnia but Ambien is helpful to reset her clock after working nights. She is staying active and eating well. Denies CP/palp/SOB/HA/fevers/GI or GU c/o. Taking meds as prescribed. She is noting increased congestion, pressure in her ears and increased tinnitus in both ears.   Past Medical History:  Diagnosis Date  . Allergy    seasonal  . Arthritis    left hip, low back  . Atrophic vaginitis 01/25/2011  . Bladder prolapse, female, acquired 05/16/2013  . Chicken pox as a child  . Compression fracture of fourth lumbar vertebra ([place]) 04/25/2018  . Cough 04/19/2012  . 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 Goiter   . Hypertension   . Measles as a child  . Mumps as a child  . Neutropenia 03/10/2011  . Other and unspecified hyperlipidemia 08/23/2013  . Other malaise and fatigue 08/26/2013  . Pharyngitis, acute 03/09/2011  . Urinary frequency 04/27/2011  . Vaginitis 04/27/2011    Past Surgical History:  Procedure Laterality Date  . BIOPSY THYROID     FNA, benign  . BREAST BIOPSY Left    FNA, benign  . CHOLECYSTECTOMY  1998  . HERNIA REPAIR Right    right, inguinal  . in grown toenails removed     partial, b/l  . laser surgery on vericose veins     b/l  . NASAL SEPTUM SURGERY  2015  . TONSILLECTOMY  as a child  . VAGINAL HYSTERECTOMY  1988   done for polapse    Family History  Problem Relation Age of Onset  . Atrial fibrillation Mother   . Obesity Mother   . Diabetes Mother 79       Type 2  . Arthritis Mother        in right knee  . Heart disease  Mother        atrial fibrillation, CHF  . Hernia Mother        never recovered from repair  . Hypertension Father   . Ulcers Father   . Pulmonary fibrosis Father   . Diabetes Maternal Grandmother        type 2?  . Heart attack Maternal Grandmother   . Hearing loss Maternal Grandmother        MI  . Parkinsonism Maternal Grandfather   . Stroke Paternal Grandmother     Social History   Socioeconomic History  . Marital status: Widowed    Spouse name: Not on file  . Number of children: 2  . Years of education: Not on file  . Highest education level: Not on file  Occupational History  . Occupation: NICU    Employer: [employer]  Tobacco Use  . Smoking status: Former Smoker    Packs/day: 1.00    Years: 5.00    Pack years: 5.00    Types: Cigarettes    Quit date: 07/12/1976    Years since quitting: 43.1  . Smokeless tobacco: Never Used  Substance and Sexual Activity  . Alcohol use: Yes    Comment:  occasionally  . Drug use: No  . Sexual activity: Yes    Partners: Male    Comment: no dietary restrictions. but does minimize refined sugar  Other Topics Concern  . Not on file  Social History Narrative  . Not on file   Social Determinants of Health   Financial Resource Strain:   . Difficulty of Paying Living Expenses: Not on file  Food Insecurity:   . Worried About Charity fundraiser in the Last Year: Not on file  . Ran Out of Food in the Last Year: Not on file  Transportation Needs:   . Lack of Transportation (Medical): Not on file  . Lack of Transportation (Non-Medical): Not on file  Physical Activity:   . Days of Exercise per Week: Not on file  . Minutes of Exercise per Session: Not on file  Stress:   . Feeling of Stress : Not on file  Social Connections:   . Frequency of Communication with Friends and Family: Not on file  . Frequency of Social Gatherings with Friends and Family: Not on file  . Attends Religious Services: Not on file  . Active Member of Clubs or  Organizations: Not on file  . Attends Archivist Meetings: Not on file  . Marital Status: Not on file  Intimate Partner Violence:   . Fear of Current or Ex-Partner: Not on file  . Emotionally Abused: Not on file  . Physically Abused: Not on file  . Sexually Abused: Not on file    Outpatient Medications Prior to Visit  Medication Sig Dispense Refill  . Calcium Carbonate-Vit D-Min (CALCIUM 600+D PLUS MINERALS) 600-400 MG-UNIT TABS Take by mouth.    . Cholecalciferol (VITAMIN D3) 5000 UNITS CAPS Take 1 capsule by mouth daily.     . Coenzyme Q10 (COQ-10) 200 MG CAPS Take 1 tablet by mouth daily.     . diphenhydrAMINE (BENADRYL) 25 mg capsule Take 25 mg by mouth as needed for itching or sleep.    . Multiple Vitamin (MULTIVITAMIN) tablet Take 1 tablet by mouth daily.      . Probiotic Product (PROBIOTIC DAILY PO) Take 1 tablet by mouth daily.     Marland Kitchen triamterene-hydrochlorothiazide (MAXZIDE-25) 37.5-25 MG tablet Take 1 tablet by mouth daily. 90 tablet 3  . metoprolol succinate (TOPROL-XL) 100 MG 24 hr tablet TAKE 1 TABLET (100 MG TOTAL) BY MOUTH DAILY. TAKE WITH OR IMMEDIATELY FOLLOWING A MEAL. 90 tablet 3  . zolpidem (AMBIEN) 10 MG tablet TAKE 1 TABLET BY MOUTH AT BEDTIME AS NEEDED FOR SLEEP 15 tablet 1  . alendronate (FOSAMAX) 70 MG tablet Take 1 tablet (70 mg total) by mouth every 7 (seven) days. Take with a full glass of water on an empty stomach. 4 tablet 4  . calcitonin, salmon, (MIACALCIN/FORTICAL) 200 UNIT/ACT nasal spray Place 1 spray into alternate nostrils daily. 3.7 mL 12  . cyclobenzaprine (FLEXERIL) 10 MG tablet Take 1 tablet (10 mg total) by mouth 2 (two) times daily as needed. 20 tablet 0  . Nutritional Supplements (GRAPESEED EXTRACT PO) Take by mouth daily.     No facility-administered medications prior to visit.    Allergies  Allergen Reactions  . Lisinopril Cough    Review of Systems  Constitutional: Positive for malaise/fatigue. Negative for chills and fever.   HENT: Negative for congestion and hearing loss.   Eyes: Negative for discharge.  Respiratory: Negative for cough, sputum production and shortness of breath.   Cardiovascular: Negative for chest pain, palpitations  and leg swelling.  Gastrointestinal: Negative for abdominal pain, blood in stool, constipation, diarrhea, heartburn, nausea and vomiting.  Genitourinary: Negative for dysuria, frequency, hematuria and urgency.  Musculoskeletal: Negative for back pain, falls and myalgias.  Skin: Negative for rash.  Neurological: Negative for dizziness, sensory change, loss of consciousness, weakness and headaches.  Endo/Heme/Allergies: Negative for environmental allergies. Does not bruise/bleed easily.  Psychiatric/Behavioral: Negative for depression and suicidal ideas. The patient has insomnia. The patient is not nervous/anxious.        Objective:    Physical Exam Constitutional:      General: She is not in acute distress.    Appearance: She is well-developed.  HENT:     Head: Normocephalic and atraumatic.  Eyes:     Conjunctiva/sclera: Conjunctivae normal.  Neck:     Thyroid: No thyromegaly.     Vascular: Carotid bruit present.     Comments: bilateral Cardiovascular:     Rate and Rhythm: Normal rate and regular rhythm.     Heart sounds: Normal heart sounds. No murmur.  Pulmonary:     Effort: Pulmonary effort is normal. No respiratory distress.     Breath sounds: Normal breath sounds.  Abdominal:     General: Bowel sounds are normal. There is no distension.     Palpations: Abdomen is soft. There is no mass.     Tenderness: There is no abdominal tenderness.  Musculoskeletal:     Cervical back: Neck supple.  Lymphadenopathy:     Cervical: No cervical adenopathy.  Skin:    General: Skin is warm and dry.  Neurological:     Mental Status: She is alert and oriented to person, place, and time.  Psychiatric:        Behavior: Behavior normal.     BP (!) 157/97 (BP Location: Left  Arm, Patient Position: Sitting, Cuff Size: Small)   Pulse 60   Temp (!) 96.7 F (35.9 C) (Temporal)   Resp 16   Ht 5\' 7"  (1.702 m)   Wt 154 lb (69.9 kg)   SpO2 100%   BMI 24.12 kg/m  Wt Readings from Last 3 Encounters:  08/28/19 154 lb (69.9 kg)  05/25/18 162 lb (73.5 kg)  04/19/18 164 lb (74.4 kg)    Diabetic Foot Exam - Simple   No data filed     Lab Results  Component Value Date   WBC 5.3 08/28/2019   HGB 12.5 08/28/2019   HCT 37.5 08/28/2019   PLT 207.0 08/28/2019   GLUCOSE 95 08/28/2019   CHOL 190 08/28/2019   TRIG 138.0 08/28/2019   HDL 50.50 08/28/2019   LDLCALC 112 (H) 08/28/2019   ALT 9 08/28/2019   AST 13 08/28/2019   NA 137 08/28/2019   K 3.6 08/28/2019   CL 101 08/28/2019   CREATININE 0.74 08/28/2019   BUN 20 08/28/2019   CO2 30 08/28/2019   TSH 1.28 08/28/2019    Lab Results  Component Value Date   TSH 1.28 08/28/2019   Lab Results  Component Value Date   WBC 5.3 08/28/2019   HGB 12.5 08/28/2019   HCT 37.5 08/28/2019   MCV 86.6 08/28/2019   PLT 207.0 08/28/2019   Lab Results  Component Value Date   NA 137 08/28/2019   K 3.6 08/28/2019   CO2 30 08/28/2019   GLUCOSE 95 08/28/2019   BUN 20 08/28/2019   CREATININE 0.74 08/28/2019   BILITOT 0.4 08/28/2019   ALKPHOS 47 08/28/2019   AST 13 08/28/2019  ALT 9 08/28/2019   PROT 7.2 08/28/2019   ALBUMIN 4.5 08/28/2019   CALCIUM 9.7 08/28/2019   GFR 78.94 08/28/2019   Lab Results  Component Value Date   CHOL 190 08/28/2019   Lab Results  Component Value Date   HDL 50.50 08/28/2019   Lab Results  Component Value Date   LDLCALC 112 (H) 08/28/2019   Lab Results  Component Value Date   TRIG 138.0 08/28/2019   Lab Results  Component Value Date   CHOLHDL 4 08/28/2019   No results found for: HGBA1C     Assessment & Plan:   Problem List Items Addressed This Visit    Hypertension    Well controlled, no changes to meds. Encouraged heart healthy diet such as the DASH diet and  exercise as tolerated.       Relevant Medications   metoprolol succinate (TOPROL-XL) 100 MG 24 hr tablet   Other Relevant Orders   CBC (Completed)   Comprehensive metabolic panel (Completed)   TSH (Completed)   High cholesterol    Encouraged heart healthy diet, increase exercise, avoid trans fats, consider a krill oil cap daily      Relevant Medications   metoprolol succinate (TOPROL-XL) 100 MG 24 hr tablet   Other Relevant Orders   Lipid panel (Completed)   Magnesium (Completed)   Preventative health care    Patient encouraged to maintain heart healthy diet, regular exercise, adequate sleep. Consider daily probiotics. Take medications as prescribed. Labs ordered and reviewed. She is hoping to retire at the end of the year. She declines mgm but will consider later in the year.       Insomnia - Primary    Encouraged good sleep hygiene such as dark, quiet room. No blue/green glowing lights such as computer screens in bedroom. No alcohol or stimulants in evening. Cut down on caffeine as able. Regular exercise is helpful but not just prior to bed time. 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 Patient might consider meds int he future      Relevant Medications   Calcium Carbonate-Vit D-Min (CALCIUM 600+D PLUS MINERALS) 600-400 MG-UNIT TABS   Muscle cramps    Hydrate, check labs.        Other Visit Diagnoses    Nasal congestion       Relevant Orders    Ambulatory referral to ENT   Muscle cramp       Relevant Orders   Magnesium (Completed)      I have discontinued Ann Barton's Nutritional Supplements (GRAPESEED EXTRACT PO), calcitonin (salmon), cyclobenzaprine, and alendronate. I have also changed her zolpidem. Additionally, I am having her maintain her multivitamin, Vitamin D3, Probiotic Product (PROBIOTIC DAILY PO), CoQ-10, diphenhydrAMINE, triamterene-hydrochlorothiazide, Calcium 600+D Plus Minerals, and metoprolol succinate.  Meds ordered this encounter  Medications  . zolpidem (AMBIEN)  10 MG tablet    Sig: Take 1 tablet (10 mg total) by mouth at bedtime as needed. for sleep    Dispense:  15 tablet    Refill:  5  . metoprolol succinate (TOPROL-XL) 100 MG 24 hr tablet    Sig: Take 1 tablet (100 mg total) by mouth daily. Take with or immediately following a meal.    Dispense:  90 tablet    Refill:  1     Penni Homans, MD

## 2019-08-28 NOTE — Assessment & Plan Note (Signed)
Well controlled, no changes to meds. Encouraged heart healthy diet such as the DASH diet and exercise as tolerated.  °

## 2019-08-28 NOTE — Assessment & Plan Note (Signed)
Encouraged to get adequate exercise, calcium and vitamin d intake. Patient might consider meds int he future

## 2019-08-28 NOTE — Assessment & Plan Note (Signed)
Worsening with increasing ear pressure and congestion is referred to ENT for further evaluation due to persistence

## 2019-08-28 NOTE — Assessment & Plan Note (Signed)
Hydrate, check labs.

## 2019-08-28 NOTE — Assessment & Plan Note (Addendum)
Patient encouraged to maintain heart healthy diet, regular exercise, adequate sleep. Consider daily probiotics. Take medications as prescribed. Labs ordered and reviewed. She is hoping to retire at the end of the year. She declines mgm but will consider later in the year.

## 2019-08-29 LAB — PAIN MGMT, PROFILE 8 W/CONF, U
6 Acetylmorphine: NEGATIVE ng/mL
Alcohol Metabolites: NEGATIVE ng/mL (ref <500)
Amphetamines: NEGATIVE ng/mL
Benzodiazepines: NEGATIVE ng/mL
Buprenorphine, Urine: NEGATIVE ng/mL
Cocaine Metabolite: NEGATIVE ng/mL
Creatinine: 43.9 mg/dL
MDMA: NEGATIVE ng/mL
Marijuana Metabolite: NEGATIVE ng/mL
Opiates: NEGATIVE ng/mL
Oxidant: NEGATIVE ug/mL
Oxycodone: NEGATIVE ng/mL
pH: 6.1 (ref 4.5–9.0)

## 2019-09-05 ENCOUNTER — Other Ambulatory Visit: Payer: Self-pay

## 2019-09-05 ENCOUNTER — Ambulatory Visit (HOSPITAL_BASED_OUTPATIENT_CLINIC_OR_DEPARTMENT_OTHER)
Admission: RE | Admit: 2019-09-05 | Discharge: 2019-09-05 | Disposition: A | Discharge status: Home / Self Care | Payer: No Typology Code available for payment source | Source: Ambulatory Visit | Attending: Family Medicine | Admitting: Family Medicine

## 2019-09-05 DIAGNOSIS — R0989 Other specified symptoms and signs involving the circulatory and respiratory systems: Secondary | ICD-10-CM | POA: Insufficient documentation

## 2019-09-20 ENCOUNTER — Encounter (INDEPENDENT_AMBULATORY_CARE_PROVIDER_SITE_OTHER): Payer: Self-pay | Admitting: Otolaryngology

## 2019-09-20 ENCOUNTER — Other Ambulatory Visit: Payer: Self-pay

## 2019-09-20 ENCOUNTER — Ambulatory Visit (INDEPENDENT_AMBULATORY_CARE_PROVIDER_SITE_OTHER): Payer: No Typology Code available for payment source | Admitting: Otolaryngology

## 2019-09-20 VITALS — Temp 97.5°F

## 2019-09-20 DIAGNOSIS — H9313 Tinnitus, bilateral: Secondary | ICD-10-CM

## 2019-09-20 DIAGNOSIS — H9012 Conductive hearing loss, unilateral, left ear, with unrestricted hearing on the contralateral side: Secondary | ICD-10-CM | POA: Diagnosis not present

## 2019-09-20 DIAGNOSIS — J310 Chronic rhinitis: Secondary | ICD-10-CM | POA: Diagnosis not present

## 2019-09-20 NOTE — Progress Notes (Signed)
HPI: Ann Barton is a 64 y.o. female who presents for evaluation of chronic tinnitus that she has had for years.  She has not had a hearing test.  She also complains of chronic sinus problems with chronic postnasal drainage with a large amount of mucus in her throat sometimes she feels like she is choking.  She has a frequent cough and clear her throat.  She has taken Zyrtec in the past as she does have allergies but this makes her sleepy. She does not hear as well from her left ear as she does her right.  Past Medical History:  Diagnosis Date  . Allergy    seasonal  . Arthritis    left hip, low back  . Atrophic vaginitis 01/25/2011  . Bladder prolapse, female, acquired 05/16/2013  . Chicken pox as a child  . Compression fracture of fourth lumbar vertebra ([place]) 04/25/2018  . Cough 04/19/2012  . 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 Goiter   . Hypertension   . Measles as a child  . Mumps as a child  . Neutropenia 03/10/2011  . Other and unspecified hyperlipidemia 08/23/2013  . Other malaise and fatigue 08/26/2013  . Pharyngitis, acute 03/09/2011  . Urinary frequency 04/27/2011  . Vaginitis 04/27/2011   Past Surgical History:  Procedure Laterality Date  . BIOPSY THYROID     FNA, benign  . BREAST BIOPSY Left    FNA, benign  . CHOLECYSTECTOMY  1998  . HERNIA REPAIR Right    right, inguinal  . in grown toenails removed     partial, b/l  . laser surgery on vericose veins     b/l  . NASAL SEPTUM SURGERY  2015  . TONSILLECTOMY  as a child  . VAGINAL HYSTERECTOMY  1988   done for polapse   Social History   Socioeconomic History  . Marital status: Widowed    Spouse name: Not on file  . Number of children: 2  . Years of education: Not on file  . Highest education level: Not on file  Occupational History  . Occupation: NICU    Employer: [employer]  Tobacco Use  . Smoking status: Former Smoker    Packs/day: 1.00    Years: 5.00    Pack years: 5.00    Types: Cigarettes   Start date: 67    Quit date: 07/12/1976    Years since quitting: 43.2  . Smokeless tobacco: Never Used  Substance and Sexual Activity  . Alcohol use: Yes    Comment: occasionally  . Drug use: No  . Sexual activity: Yes    Partners: Male    Comment: no dietary restrictions. but does minimize refined sugar  Other Topics Concern  . Not on file  Social History Narrative  . Not on file   Social Determinants of Health   Financial Resource Strain:   . Difficulty of Paying Living Expenses:   Food Insecurity:   . Worried About Charity fundraiser in the Last Year:   . Arboriculturist in the Last Year:   Transportation Needs:   . Film/video editor (Medical):   Marland Kitchen Lack of Transportation (Non-Medical):   Physical Activity:   . Days of Exercise per Week:   . Minutes of Exercise per Session:   Stress:   . Feeling of Stress :   Social Connections:   . Frequency of Communication with Friends and Family:   . Frequency of Social Gatherings with Friends and Family:   .  Attends Religious Services:   . Active Member of Clubs or Organizations:   . Attends Archivist Meetings:   Marland Kitchen Marital Status:    Family History  Problem Relation Age of Onset  . Atrial fibrillation Mother   . Obesity Mother   . Diabetes Mother 66       Type 2  . Arthritis Mother        in right knee  . Heart disease Mother        atrial fibrillation, CHF  . Hernia Mother        never recovered from repair  . Hypertension Father   . Ulcers Father   . Pulmonary fibrosis Father   . Diabetes Maternal Grandmother        type 2?  . Heart attack Maternal Grandmother   . Hearing loss Maternal Grandmother        MI  . Parkinsonism Maternal Grandfather   . Stroke Paternal Grandmother    Allergies  Allergen Reactions  . Lisinopril Cough   Prior to Admission medications   Medication Sig Start Date End Date Taking? Authorizing Provider  Calcium Carbonate-Vit D-Min (CALCIUM 600+D PLUS MINERALS) 600-400  MG-UNIT TABS Take by mouth.   Yes [provider]  Cholecalciferol (VITAMIN D3) 5000 UNITS CAPS Take 1 capsule by mouth daily.    Yes [provider]  Coenzyme Q10 (COQ-10) 200 MG CAPS Take 1 tablet by mouth daily.    Yes [provider]  diphenhydrAMINE (BENADRYL) 25 mg capsule Take 25 mg by mouth as needed for itching or sleep.   Yes [provider]  metoprolol succinate (TOPROL-XL) 100 MG 24 hr tablet Take 1 tablet (100 mg total) by mouth daily. Take with or immediately following a meal. 08/28/19  Yes Mosie Lukes, MD  Multiple Vitamin (MULTIVITAMIN) tablet Take 1 tablet by mouth daily.     Yes [provider]  Probiotic Product (PROBIOTIC DAILY PO) Take 1 tablet by mouth daily.    Yes [provider]  triamterene-hydrochlorothiazide (MAXZIDE-25) 37.5-25 MG tablet Take 1 tablet by mouth daily. 07/21/17  Yes Mosie Lukes, MD  zolpidem (AMBIEN) 10 MG tablet Take 1 tablet (10 mg total) by mouth at bedtime as needed. for sleep 08/28/19  Yes Mosie Lukes, MD     Positive ROS: Otherwise negative  All other systems have been reviewed and were otherwise negative with the exception of those mentioned in the HPI and as above.  Physical Exam: Constitutional: Alert, well-appearing, no acute distress Ears: External ears without lesions or tenderness. Ear canals are clear bilaterally.  Left TM is retracted with what appears to be erosion of the long process of the incus.  Right TM is slightly retracted but clear otherwise with no middle ear fluid noted.  No middle ear fluid noted on the left side.  However on tuning fork testing she has a mild hearing loss on the right side with AC = BC.  On the left side BC is greater than AC.  Weber lateralizes to the left. Nasal: External nose without lesions. Septum with minimal deformity.  Moderate rhinitis with clear middle meatus bilaterally with no signs of active infection.  Mucus within the nasal cavity is  clear. Oral: Lips and gums without lesions. Tongue and palate mucosa without lesions. Posterior oropharynx clear. Neck: No palpable adenopathy or masses Respiratory: Breathing comfortably  Skin: No facial/neck lesions or rash noted.  Procedures  Assessment: Left ear conductive hearing loss and mild  hearing loss in both ears with additional conductive component on the left side. Chronic rhinitis  Plan: Recommended regular use of Nasacort 2 sprays each nostril at night. Suggested using saline irrigation and suggested trying Xlear. Gave her some samples of Mucinex to try. Also suggested trying Claritin or Allegra instead of Zyrtec as these are less sedating. Recommended obtaining an audiogram to evaluate hearing loss.  She would probably be a candidate for hearing aids and possible surgery on the left side depending on the amount of conductive loss.  Radene Journey, MD

## 2019-09-28 ENCOUNTER — Ambulatory Visit (INDEPENDENT_AMBULATORY_CARE_PROVIDER_SITE_OTHER): Payer: No Typology Code available for payment source | Admitting: Family Medicine

## 2019-09-28 ENCOUNTER — Other Ambulatory Visit: Payer: Self-pay

## 2019-09-28 VITALS — BP 109/67 | HR 58

## 2019-09-28 DIAGNOSIS — E7800 Pure hypercholesterolemia, unspecified: Secondary | ICD-10-CM

## 2019-09-28 DIAGNOSIS — H9313 Tinnitus, bilateral: Secondary | ICD-10-CM | POA: Diagnosis not present

## 2019-09-28 DIAGNOSIS — H9190 Unspecified hearing loss, unspecified ear: Secondary | ICD-10-CM | POA: Diagnosis not present

## 2019-09-28 DIAGNOSIS — J3089 Other allergic rhinitis: Secondary | ICD-10-CM

## 2019-09-28 DIAGNOSIS — M810 Age-related osteoporosis without current pathological fracture: Secondary | ICD-10-CM | POA: Diagnosis not present

## 2019-09-28 DIAGNOSIS — I10 Essential (primary) hypertension: Secondary | ICD-10-CM

## 2019-09-28 DIAGNOSIS — J302 Other seasonal allergic rhinitis: Secondary | ICD-10-CM

## 2019-09-28 NOTE — Assessment & Plan Note (Signed)
Encouraged heart healthy diet, increase exercise, avoid trans fats, consider a krill oil cap daily. Mild but FH includes a MGM with MI at 64 year old. She declines a prescription statin but agrees to start Red Yeast Rice and she already takes CoQ10 Enzyme. Recheck labs in 3 months

## 2019-09-28 NOTE — Assessment & Plan Note (Signed)
Seen by ENT and is treating her allergies more aggressively. Her symptoms are improved.

## 2019-09-28 NOTE — Assessment & Plan Note (Signed)
Encouraged to get adequate exercise, calcium and vitamin d intake 

## 2019-09-28 NOTE — Patient Instructions (Signed)
[employer] Blood Pressure cuff, upper arm, want BP 100-140/60-90 Pulse oximeter, want oxygen in 90s  Weekly vitals  Take Multivitamin with minerals, selenium Vitamin D 1000-2000 IU daily Probiotic with lactobacillus and bifidophilus Asprin EC 81 mg daily Fish or krill oil Melatonin 2-5 mg at bedtime  [email or web address]  Red Yeast Rice and CoQ10 enzyme daily, NOW company at [email or web address]

## 2019-09-28 NOTE — Progress Notes (Signed)
Virtual Visit via phone Note  I connected with Ann Barton on 09/28/19 at 10:40 AM EDT by a phone enabled telemedicine application and verified that I am speaking with the correct person using two identifiers.  Location: Patient: home Provider: home   I discussed the limitations of evaluation and management by telemedicine and the availability of in person appointments. The patient expressed understanding and agreed to proceed. Magdalene Molly, CMA was able to set up patient with doxy visit but she was unable to get her camera turned on. She was able to see [place].    Subjective:    Patient ID: Ann Barton, female    DOB: February 11, 1956, 64 y.o.   MRN: [id number]  No chief complaint on file.   HPI Patient is in today for follow up on chronic medical concerns. No recent febrile illness or hospitalizations. No recent acute concerns. She has seen ENT now and is treating her allergies more aggressively with improvement in symptoms but tinnitus is unchanged. She is in need of Audiology exam. Denies CP/palp/SOB/HA/congestion/fevers/GI or GU c/o. Taking meds as prescribed  Past Medical History:  Diagnosis Date  . Allergy    seasonal  . Arthritis    left hip, low back  . Atrophic vaginitis 01/25/2011  . Bladder prolapse, female, acquired 05/16/2013  . Chicken pox as a child  . Compression fracture of fourth lumbar vertebra ([place]) 04/25/2018  . Cough 04/19/2012  . 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 Goiter   . Hypertension   . Measles as a child  . Mumps as a child  . Neutropenia 03/10/2011  . Other and unspecified hyperlipidemia 08/23/2013  . Other malaise and fatigue 08/26/2013  . Pharyngitis, acute 03/09/2011  . Urinary frequency 04/27/2011  . Vaginitis 04/27/2011    Past Surgical History:  Procedure Laterality Date  . BIOPSY THYROID     FNA, benign  . BREAST BIOPSY Left    FNA, benign  . CHOLECYSTECTOMY  1998  . HERNIA REPAIR Right    right, inguinal  . in grown toenails removed      partial, b/l  . laser surgery on vericose veins     b/l  . NASAL SEPTUM SURGERY  2015  . TONSILLECTOMY  as a child  . VAGINAL HYSTERECTOMY  1988   done for polapse    Family History  Problem Relation Age of Onset  . Atrial fibrillation Mother   . Obesity Mother   . Diabetes Mother 34       Type 2  . Arthritis Mother        in right knee  . Heart disease Mother        atrial fibrillation, CHF  . Hernia Mother        never recovered from repair  . Hypertension Father   . Ulcers Father   . Pulmonary fibrosis Father   . Diabetes Maternal Grandmother        type 2?  . Heart attack Maternal Grandmother   . Hearing loss Maternal Grandmother        MI  . Parkinsonism Maternal Grandfather   . Stroke Paternal Grandmother     Social History   Socioeconomic History  . Marital status: Widowed    Spouse name: Not on file  . Number of children: 2  . Years of education: Not on file  . Highest education level: Not on file  Occupational History  . Occupation: NICU    Employer: [employer]  Tobacco Use  .  Smoking status: Former Smoker    Packs/day: 1.00    Years: 5.00    Pack years: 5.00    Types: Cigarettes    Start date: 59    Quit date: 07/12/1976    Years since quitting: 43.2  . Smokeless tobacco: Never Used  Substance and Sexual Activity  . Alcohol use: Yes    Comment: occasionally  . Drug use: No  . Sexual activity: Yes    Partners: Male    Comment: no dietary restrictions. but does minimize refined sugar  Other Topics Concern  . Not on file  Social History Narrative  . Not on file   Social Determinants of Health   Financial Resource Strain:   . Difficulty of Paying Living Expenses:   Food Insecurity:   . Worried About Charity fundraiser in the Last Year:   . Arboriculturist in the Last Year:   Transportation Needs:   . Film/video editor (Medical):   Marland Kitchen Lack of Transportation (Non-Medical):   Physical Activity:   . Days of Exercise per Week:    . Minutes of Exercise per Session:   Stress:   . Feeling of Stress :   Social Connections:   . Frequency of Communication with Friends and Family:   . Frequency of Social Gatherings with Friends and Family:   . Attends Religious Services:   . Active Member of Clubs or Organizations:   . Attends Archivist Meetings:   Marland Kitchen Marital Status:   Intimate Partner Violence:   . Fear of Current or Ex-Partner:   . Emotionally Abused:   Marland Kitchen Physically Abused:   . Sexually Abused:     Outpatient Medications Prior to Visit  Medication Sig Dispense Refill  . Calcium Carbonate-Vit D-Min (CALCIUM 600+D PLUS MINERALS) 600-400 MG-UNIT TABS Take by mouth.    . Cholecalciferol (VITAMIN D3) 5000 UNITS CAPS Take 1 capsule by mouth daily.     . Coenzyme Q10 (COQ-10) 200 MG CAPS Take 1 tablet by mouth daily.     . diphenhydrAMINE (BENADRYL) 25 mg capsule Take 25 mg by mouth as needed for itching or sleep.    . metoprolol succinate (TOPROL-XL) 100 MG 24 hr tablet Take 1 tablet (100 mg total) by mouth daily. Take with or immediately following a meal. 90 tablet 1  . Multiple Vitamin (MULTIVITAMIN) tablet Take 1 tablet by mouth daily.      . Probiotic Product (PROBIOTIC DAILY PO) Take 1 tablet by mouth daily.     Marland Kitchen triamterene-hydrochlorothiazide (MAXZIDE-25) 37.5-25 MG tablet Take 1 tablet by mouth daily. 90 tablet 3  . zolpidem (AMBIEN) 10 MG tablet Take 1 tablet (10 mg total) by mouth at bedtime as needed. for sleep 15 tablet 5   No facility-administered medications prior to visit.    Allergies  Allergen Reactions  . Lisinopril Cough    Review of Systems  Constitutional: Negative for fever and malaise/fatigue.  HENT: Negative for congestion.   Eyes: Negative for blurred vision.  Respiratory: Negative for shortness of breath.   Cardiovascular: Negative for chest pain, palpitations and leg swelling.  Gastrointestinal: Negative for abdominal pain, blood in stool and nausea.  Genitourinary:  Negative for dysuria and frequency.  Musculoskeletal: Negative for falls.  Skin: Negative for rash.  Neurological: Negative for dizziness, loss of consciousness and headaches.  Endo/Heme/Allergies: Negative for environmental allergies.  Psychiatric/Behavioral: Negative for depression. The patient is not nervous/anxious.        Objective:  Physical Exam unable to perform without patient camera  BP 109/67   Pulse (!) 58  Wt Readings from Last 3 Encounters:  08/28/19 154 lb (69.9 kg)  05/25/18 162 lb (73.5 kg)  04/19/18 164 lb (74.4 kg)    Diabetic Foot Exam - Simple   No data filed     Lab Results  Component Value Date   WBC 5.3 08/28/2019   HGB 12.5 08/28/2019   HCT 37.5 08/28/2019   PLT 207.0 08/28/2019   GLUCOSE 95 08/28/2019   CHOL 190 08/28/2019   TRIG 138.0 08/28/2019   HDL 50.50 08/28/2019   LDLCALC 112 (H) 08/28/2019   ALT 9 08/28/2019   AST 13 08/28/2019   NA 137 08/28/2019   K 3.6 08/28/2019   CL 101 08/28/2019   CREATININE 0.74 08/28/2019   BUN 20 08/28/2019   CO2 30 08/28/2019   TSH 1.28 08/28/2019    Lab Results  Component Value Date   TSH 1.28 08/28/2019   Lab Results  Component Value Date   WBC 5.3 08/28/2019   HGB 12.5 08/28/2019   HCT 37.5 08/28/2019   MCV 86.6 08/28/2019   PLT 207.0 08/28/2019   Lab Results  Component Value Date   NA 137 08/28/2019   K 3.6 08/28/2019   CO2 30 08/28/2019   GLUCOSE 95 08/28/2019   BUN 20 08/28/2019   CREATININE 0.74 08/28/2019   BILITOT 0.4 08/28/2019   ALKPHOS 47 08/28/2019   AST 13 08/28/2019   ALT 9 08/28/2019   PROT 7.2 08/28/2019   ALBUMIN 4.5 08/28/2019   CALCIUM 9.7 08/28/2019   GFR 78.94 08/28/2019   Lab Results  Component Value Date   CHOL 190 08/28/2019   Lab Results  Component Value Date   HDL 50.50 08/28/2019   Lab Results  Component Value Date   LDLCALC 112 (H) 08/28/2019   Lab Results  Component Value Date   TRIG 138.0 08/28/2019   Lab Results  Component Value  Date   CHOLHDL 4 08/28/2019   No results found for: HGBA1C     Assessment & Plan:   Problem List Items Addressed This Visit    Hypertension    Monitor and report any concerns, no changes to meds. Encouraged heart healthy diet such as the DASH diet and exercise as tolerated.       High cholesterol    Encouraged heart healthy diet, increase exercise, avoid trans fats, consider a krill oil cap daily. Mild but FH includes a MGM with MI at 64 year old. She declines a prescription statin but agrees to start Red Yeast Rice and she already takes CoQ10 Enzyme. Recheck labs in 3 months      Seasonal and perennial allergic rhinitis    Seen by ENT and is treating her allergies more aggressively. Her symptoms are improved.       Tinnitus of both ears - Primary    Has been seen by ENT but she still needs an audiology exam still. Referred to [facility] for further consideration      Relevant Orders   Ambulatory referral to Audiology   Osteoporosis    Encouraged to get adequate exercise, calcium and vitamin d intake       Other Visit Diagnoses    Hearing loss, unspecified hearing loss type, unspecified laterality       Relevant Orders   Ambulatory referral to Audiology      I am having Butch Penny L. [place] maintain her multivitamin, Vitamin D3,  Probiotic Product (PROBIOTIC DAILY PO), CoQ-10, diphenhydrAMINE, triamterene-hydrochlorothiazide, Calcium 600+D Plus Minerals, zolpidem, and metoprolol succinate.  No orders of the defined types were placed in this encounter.    I discussed the assessment and treatment plan with the patient. The patient was provided an opportunity to ask questions and all were answered. The patient agreed with the plan and demonstrated an understanding of the instructions.   The patient was advised to call back or seek an in-person evaluation if the symptoms worsen or if the condition fails to improve as anticipated.  I provided 25 minutes of non-face-to-face time  during this encounter.   Penni Homans, MD

## 2019-09-28 NOTE — Assessment & Plan Note (Signed)
Has been seen by ENT but she still needs an audiology exam still. Referred to [facility] for further consideration

## 2019-09-28 NOTE — Assessment & Plan Note (Signed)
Monitor and report any concerns, no changes to meds. Encouraged heart healthy diet such as the DASH diet and exercise as tolerated.  ?

## 2019-11-05 ENCOUNTER — Encounter (HOSPITAL_COMMUNITY): Payer: Self-pay

## 2019-11-05 ENCOUNTER — Observation Stay (HOSPITAL_COMMUNITY)
Admission: EM | Admit: 2019-11-05 | Discharge: 2019-11-08 | Disposition: A | Discharge status: Home / Self Care | Payer: No Typology Code available for payment source | Attending: Internal Medicine | Admitting: Internal Medicine

## 2019-11-05 ENCOUNTER — Other Ambulatory Visit: Payer: Self-pay

## 2019-11-05 DIAGNOSIS — M1612 Unilateral primary osteoarthritis, left hip: Secondary | ICD-10-CM | POA: Insufficient documentation

## 2019-11-05 DIAGNOSIS — D649 Anemia, unspecified: Secondary | ICD-10-CM | POA: Diagnosis present

## 2019-11-05 DIAGNOSIS — E785 Hyperlipidemia, unspecified: Secondary | ICD-10-CM | POA: Insufficient documentation

## 2019-11-05 DIAGNOSIS — D62 Acute posthemorrhagic anemia: Principal | ICD-10-CM | POA: Insufficient documentation

## 2019-11-05 DIAGNOSIS — Z87891 Personal history of nicotine dependence: Secondary | ICD-10-CM | POA: Insufficient documentation

## 2019-11-05 DIAGNOSIS — Z20822 Contact with and (suspected) exposure to covid-19: Secondary | ICD-10-CM | POA: Diagnosis not present

## 2019-11-05 DIAGNOSIS — M479 Spondylosis, unspecified: Secondary | ICD-10-CM | POA: Diagnosis not present

## 2019-11-05 DIAGNOSIS — I10 Essential (primary) hypertension: Secondary | ICD-10-CM | POA: Diagnosis not present

## 2019-11-05 DIAGNOSIS — K921 Melena: Secondary | ICD-10-CM | POA: Diagnosis not present

## 2019-11-05 DIAGNOSIS — K922 Gastrointestinal hemorrhage, unspecified: Secondary | ICD-10-CM | POA: Diagnosis present

## 2019-11-05 DIAGNOSIS — Z7982 Long term (current) use of aspirin: Secondary | ICD-10-CM | POA: Diagnosis not present

## 2019-11-05 DIAGNOSIS — Z79899 Other long term (current) drug therapy: Secondary | ICD-10-CM | POA: Diagnosis not present

## 2019-11-05 LAB — COMPREHENSIVE METABOLIC PANEL
ALT: 15 U/L (ref 0–44)
AST: 16 U/L (ref 15–41)
Albumin: 3.7 g/dL (ref 3.5–5.0)
Alkaline Phosphatase: 42 U/L (ref 38–126)
Anion gap: 10 (ref 5–15)
BUN: 17 mg/dL (ref 8–23)
CO2: 23 mmol/L (ref 22–32)
Calcium: 8.6 mg/dL — ABNORMAL LOW (ref 8.9–10.3)
Chloride: 102 mmol/L (ref 98–111)
Creatinine, Ser: 0.79 mg/dL (ref 0.44–1.00)
GFR calc Af Amer: >60 mL/min (ref >60)
GFR calc non Af Amer: >60 mL/min (ref >60)
Glucose, Bld: 128 mg/dL — ABNORMAL HIGH (ref 70–99)
Potassium: 3.6 mmol/L (ref 3.5–5.1)
Sodium: 135 mmol/L (ref 135–145)
Total Bilirubin: 0.5 mg/dL (ref 0.3–1.2)
Total Protein: 6.4 g/dL — ABNORMAL LOW (ref 6.5–8.1)

## 2019-11-05 LAB — CBC
HCT: 31.6 % — ABNORMAL LOW (ref 36.0–46.0)
Hemoglobin: 10 g/dL — ABNORMAL LOW (ref 12.0–15.0)
MCH: 28.6 pg (ref 26.0–34.0)
MCHC: 31.6 g/dL (ref 30.0–36.0)
MCV: 90.3 fL (ref 80.0–100.0)
Platelets: 266 10*3/uL (ref 150–400)
RBC: 3.5 MIL/uL — ABNORMAL LOW (ref 3.87–5.11)
RDW: 12.7 % (ref 11.5–15.5)
WBC: 9.1 10*3/uL (ref 4.0–10.5)
nRBC: 0 % (ref 0.0–0.2)

## 2019-11-05 LAB — ABO/RH: ABO/RH(D): O NEG

## 2019-11-05 NOTE — ED Triage Notes (Addendum)
Pt presents w/rectal bleeding starting today. Pt denies hx of the same  Pt reports feeling nauseous and dizzy when standing earlier today

## 2019-11-06 DIAGNOSIS — D62 Acute posthemorrhagic anemia: Secondary | ICD-10-CM | POA: Diagnosis not present

## 2019-11-06 DIAGNOSIS — I10 Essential (primary) hypertension: Secondary | ICD-10-CM

## 2019-11-06 DIAGNOSIS — K922 Gastrointestinal hemorrhage, unspecified: Secondary | ICD-10-CM | POA: Diagnosis present

## 2019-11-06 DIAGNOSIS — D649 Anemia, unspecified: Secondary | ICD-10-CM | POA: Diagnosis present

## 2019-11-06 LAB — HIV ANTIBODY (ROUTINE TESTING W REFLEX): HIV Screen 4th Generation wRfx: NONREACTIVE

## 2019-11-06 LAB — HEMOGLOBIN AND HEMATOCRIT, BLOOD
HCT: 22.8 % — ABNORMAL LOW (ref 36.0–46.0)
Hemoglobin: 7.3 g/dL — ABNORMAL LOW (ref 12.0–15.0)

## 2019-11-06 LAB — POC OCCULT BLOOD, ED: Fecal Occult Bld: POSITIVE — AB

## 2019-11-06 LAB — RESPIRATORY PANEL BY RT PCR (FLU A&B, COVID)
Influenza A by PCR: NEGATIVE
Influenza B by PCR: NEGATIVE
SARS Coronavirus 2 by RT PCR: NEGATIVE

## 2019-11-06 MED ORDER — PROBIOTIC DAILY PO CAPS: 1.0000 | ORAL_CAPSULE | Freq: Every day | ORAL | Status: DC

## 2019-11-06 MED ORDER — RISAQUAD PO CAPS
1.0000 | ORAL_CAPSULE | Freq: Every day | ORAL | Status: DC
  Administered 2019-11-07 – 2019-11-08 (×2): 1 via ORAL
  Filled 2019-11-06 (×3): qty 1

## 2019-11-06 MED ORDER — SODIUM CHLORIDE 0.9% FLUSH
3.0000 mL | Freq: Two times a day (BID) | INTRAVENOUS | Status: DC
  Administered 2019-11-06 – 2019-11-07 (×2): 3 mL via INTRAVENOUS

## 2019-11-06 MED ORDER — ONDANSETRON HCL 4 MG PO TABS: 4.0000 mg | ORAL_TABLET | Freq: Four times a day (QID) | ORAL | Status: DC | PRN

## 2019-11-06 MED ORDER — SODIUM CHLORIDE 0.9 % IV BOLUS
1000.0000 mL | Freq: Once | INTRAVENOUS | Status: AC
  Administered 2019-11-06: 09:00:00 1000 mL via INTRAVENOUS

## 2019-11-06 MED ORDER — METOPROLOL SUCCINATE ER 100 MG PO TB24
100.0000 mg | ORAL_TABLET | Freq: Every day | ORAL | Status: DC
  Administered 2019-11-06 – 2019-11-08 (×3): 100 mg via ORAL
  Filled 2019-11-06: qty 1
  Filled 2019-11-06: qty 4
  Filled 2019-11-06: qty 1

## 2019-11-06 MED ORDER — ACETAMINOPHEN 650 MG RE SUPP: 650.0000 mg | Freq: Four times a day (QID) | RECTAL | Status: DC | PRN

## 2019-11-06 MED ORDER — ACETAMINOPHEN 325 MG PO TABS
650.0000 mg | ORAL_TABLET | Freq: Four times a day (QID) | ORAL | Status: DC | PRN
  Administered 2019-11-06: 650 mg via ORAL
  Filled 2019-11-06: qty 2

## 2019-11-06 MED ORDER — ONDANSETRON HCL 4 MG/2ML IJ SOLN: 4.0000 mg | Freq: Four times a day (QID) | INTRAMUSCULAR | Status: DC | PRN

## 2019-11-06 MED ORDER — ALBUTEROL SULFATE (2.5 MG/3ML) 0.083% IN NEBU: 2.5000 mg | INHALATION_SOLUTION | Freq: Four times a day (QID) | RESPIRATORY_TRACT | Status: DC | PRN

## 2019-11-06 MED ORDER — SODIUM CHLORIDE 0.9 % IV SOLN
80.0000 mg | Freq: Once | INTRAVENOUS | Status: AC
  Administered 2019-11-06: 80 mg via INTRAVENOUS
  Filled 2019-11-06: qty 80

## 2019-11-06 MED ORDER — SODIUM CHLORIDE 0.9 % IV SOLN: INTRAVENOUS | Status: DC

## 2019-11-06 MED ORDER — ZOLPIDEM TARTRATE 5 MG PO TABS: 5.0000 mg | ORAL_TABLET | Freq: Every evening | ORAL | Status: DC | PRN

## 2019-11-06 MED ORDER — SODIUM CHLORIDE 0.9 % IV SOLN
8.0000 mg/h | INTRAVENOUS | Status: DC
  Administered 2019-11-06: 10:00:00 8 mg/h via INTRAVENOUS
  Filled 2019-11-06: qty 80

## 2019-11-06 NOTE — H&P (Addendum)
History and Physical    Ann Barton [id number] DOB: 1956-02-08 DOA: 11/05/2019  Referring MD/NP/PA: Voncille Lo, MD PCP: Mosie Lukes, MD  Patient coming from: Home   Chief Complaint: Blood in stool  I have personally briefly reviewed patient's old medical records in [place]   HPI: Ann Barton is a 64 y.o. female with medical history significant of essential hypertension and arthritis who presented to the emergency department with reports of blood in her stool.  She reports having at least 10 episodes prior to coming to the emergency department yesterday with blood clots present.  She subsequently had an additional form bloody bowel movements while waiting to be seen.  Denies having any abdominal pain, shortness of breath, nausea, vomiting, or lightheadedness.  Patient last colonoscopy was in 2011 performed by Dr. Benson Norway where she was noted to have diverticulosis.  She had the Cologuard test performed last year and it was noted to be negative.  She has never had symptoms like this before. Denies any use of any NSAIDs and is not on any blood thinners  ED Course:  Upon admission into the emergency department patient was noted to be afebrile with blood pressure transiently as low as 86/70.  Labs significant for hemoglobin 10.  Stool guaiacs noted to be positive for blood.  [facility] was consulted.  COVID-19 and influenza screening negative.  She had initially been given 80 mg of Protonix and started on a drip.  TRH called to admit.  Review of Systems  Constitutional: Negative for fever and malaise/fatigue.  HENT: Negative for sinus pain.   Eyes: Negative for photophobia and pain.  Respiratory: Negative for cough and shortness of breath.   Cardiovascular: Negative for chest pain and leg swelling.  Gastrointestinal: Positive for blood in stool. Negative for abdominal pain, nausea and vomiting.  Genitourinary: Negative for dysuria and hematuria.  Musculoskeletal: Positive for  joint pain. Negative for falls and myalgias.  Skin: Negative for rash.  Neurological: Negative for focal weakness and loss of consciousness.  Endo/Heme/Allergies: Does not bruise/bleed easily.  Psychiatric/Behavioral: Negative for memory loss and substance abuse.    Past Medical History:  Diagnosis Date  . Allergy    seasonal  . Arthritis    left hip, low back  . Atrophic vaginitis 01/25/2011  . Bladder prolapse, female, acquired 05/16/2013  . Chicken pox as a child  . Compression fracture of fourth lumbar vertebra ([place]) 04/25/2018  . Cough 04/19/2012  . 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 Goiter   . Hypertension   . Measles as a child  . Mumps as a child  . Neutropenia 03/10/2011  . Other and unspecified hyperlipidemia 08/23/2013  . Other malaise and fatigue 08/26/2013  . Pharyngitis, acute 03/09/2011  . Urinary frequency 04/27/2011  . Vaginitis 04/27/2011    Past Surgical History:  Procedure Laterality Date  . BIOPSY THYROID     FNA, benign  . BREAST BIOPSY Left    FNA, benign  . CHOLECYSTECTOMY  1998  . HERNIA REPAIR Right    right, inguinal  . in grown toenails removed     partial, b/l  . laser surgery on vericose veins     b/l  . NASAL SEPTUM SURGERY  2015  . TONSILLECTOMY  as a child  . VAGINAL HYSTERECTOMY  1988   done for polapse     reports that she quit smoking about 43 years ago. Her smoking use included cigarettes. She started smoking about 48 years  ago. She has a 5.00 pack-year smoking history. She has never used smokeless tobacco. She reports current alcohol use. She reports that she does not use drugs.  Allergies  Allergen Reactions  . Lisinopril Cough    Family History  Problem Relation Age of Onset  . Atrial fibrillation Mother   . Obesity Mother   . Diabetes Mother 45       Type 2  . Arthritis Mother        in right knee  . Heart disease Mother        atrial fibrillation, CHF  . Hernia Mother        never recovered from repair  .  Hypertension Father   . Ulcers Father   . Pulmonary fibrosis Father   . Diabetes Maternal Grandmother        type 2?  . Heart attack Maternal Grandmother   . Hearing loss Maternal Grandmother        MI  . Parkinsonism Maternal Grandfather   . Stroke Paternal Grandmother     Prior to Admission medications   Medication Sig Start Date End Date Taking? Authorizing Provider  Calcium Carbonate-Vit D-Min (CALCIUM 600+D PLUS MINERALS) 600-400 MG-UNIT TABS Take by mouth.   Yes [provider]  Cholecalciferol (VITAMIN D3) 5000 UNITS CAPS Take 1 capsule by mouth daily.    Yes [provider]  Coenzyme Q10 (COQ-10) 200 MG CAPS Take 1 tablet by mouth daily.    Yes [provider]  metoprolol succinate (TOPROL-XL) 100 MG 24 hr tablet Take 1 tablet (100 mg total) by mouth daily. Take with or immediately following a meal. 08/28/19  Yes Mosie Lukes, MD  Multiple Vitamin (MULTIVITAMIN) tablet Take 1 tablet by mouth daily.     Yes [provider]  Probiotic Product (PROBIOTIC DAILY PO) Take 1 tablet by mouth daily.    Yes [provider]  Red Yeast Rice Extract (RED YEAST RICE PO) Take 1 capsule by mouth daily.   Yes [provider]  triamterene-hydrochlorothiazide (MAXZIDE-25) 37.5-25 MG tablet Take 1 tablet by mouth daily. 07/21/17  Yes Mosie Lukes, MD  zolpidem (AMBIEN) 10 MG tablet Take 1 tablet (10 mg total) by mouth at bedtime as needed. for sleep 08/28/19  Yes Mosie Lukes, MD    Physical Exam:  Constitutional: Older female currently in NAD, calm, comfortable Vitals:   11/05/19 2117 11/06/19 0101 11/06/19 0553 11/06/19 0908  BP: (!) 132/101 (!) 86/70 120/80 119/66  Pulse: 94 69 68 83  Resp: 16 17 17 16   Temp:  98 F (36.7 C) 98 F (36.7 C)   TempSrc:      SpO2: 99% 98% 98% 100%   Eyes: PERRL, lids and conjunctivae normal ENMT: Mucous membranes are moist. Posterior pharynx clear of any exudate or lesions.  Neck: normal,  supple, no masses, no thyromegaly Respiratory: clear to auscultation bilaterally, no wheezing, no crackles. Normal respiratory effort. No accessory muscle use.  Cardiovascular: Regular rate and rhythm, no murmurs / rubs / gallops. No extremity edema. 2+ pedal pulses. No carotid bruits.  Abdomen: no tenderness, no masses palpated. No hepatosplenomegaly. Bowel sounds positive.  Musculoskeletal: no clubbing / cyanosis. No joint deformity upper and lower extremities. Good ROM, no contractures. Normal muscle tone.  Skin: no rashes, lesions, ulcers. No induration Neurologic: CN 2-12 grossly intact. Sensation intact, DTR normal. Strength 5/5 in all 4.  Psychiatric: Normal judgment and insight. Alert and oriented x 3. Normal mood.  Labs on Admission: I have personally reviewed following labs and imaging studies  CBC: Recent Labs  Lab 11/05/19 2057  WBC 9.1  HGB 10.0*  HCT 31.6*  MCV 90.3  PLT [id number]   Basic Metabolic Panel: Recent Labs  Lab 11/05/19 2057  NA 135  K 3.6  CL 102  CO2 23  GLUCOSE 128*  BUN 17  CREATININE 0.79  CALCIUM 8.6*   GFR: CrCl cannot be calculated (Unknown ideal weight.). Liver Function Tests: Recent Labs  Lab 11/05/19 2057  AST 16  ALT 15  ALKPHOS 42  BILITOT 0.5  PROT 6.4*  ALBUMIN 3.7   No results for input(s): LIPASE, AMYLASE in the last 168 hours. No results for input(s): AMMONIA in the last 168 hours. Coagulation Profile: No results for input(s): INR, PROTIME in the last 168 hours. Cardiac Enzymes: No results for input(s): CKTOTAL, CKMB, CKMBINDEX, TROPONINI in the last 168 hours. BNP (last 3 results) No results for input(s): PROBNP in the last 8760 hours. HbA1C: No results for input(s): HGBA1C in the last 72 hours. CBG: No results for input(s): GLUCAP in the last 168 hours. Lipid Profile: No results for input(s): CHOL, HDL, LDLCALC, TRIG, CHOLHDL, LDLDIRECT in the last 72 hours. Thyroid Function Tests: No results for input(s): TSH,  T4TOTAL, FREET4, T3FREE, THYROIDAB in the last 72 hours. Anemia Panel: No results for input(s): VITAMINB12, FOLATE, FERRITIN, TIBC, IRON, RETICCTPCT in the last 72 hours. Urine analysis:    Component Value Date/Time   COLORURINE TEST NOT PERFORMED 08/23/2013 1322   APPEARANCEUR TEST NOT PERFORMED 08/23/2013 1322   LABSPEC TEST NOT PERFORMED 08/23/2013 1322   PHURINE TEST NOT PERFORMED 08/23/2013 1322   GLUCOSEU TEST NOT PERFORMED 08/23/2013 1322   HGBUR TEST NOT PERFORMED 08/23/2013 1322   BILIRUBINUR neg 02/17/2018 1457   KETONESUR TEST NOT PERFORMED 08/23/2013 1322   PROTEINUR Positive (A) 02/17/2018 1457   PROTEINUR TEST NOT PERFORMED 08/23/2013 1322   UROBILINOGEN 0.2 02/17/2018 1457   UROBILINOGEN TEST NOT PERFORMED 08/23/2013 1322   NITRITE positive 02/17/2018 1457   NITRITE TEST NOT PERFORMED 08/23/2013 1322   LEUKOCYTESUR Large (3+) (A) 02/17/2018 1457   Sepsis Labs: Recent Results (from the past 240 hour(s))  Respiratory Panel by RT PCR (Flu A&B, Covid) - Nasopharyngeal Swab     Status: None   Collection Time: 11/06/19  9:19 AM   Specimen: Nasopharyngeal Swab  Result Value Ref Range Status   SARS Coronavirus 2 by RT PCR NEGATIVE NEGATIVE Final    Comment: (NOTE) SARS-CoV-2 target nucleic acids are NOT DETECTED. The SARS-CoV-2 RNA is generally detectable in upper respiratoy specimens during the acute phase of infection. The lowest concentration of SARS-CoV-2 viral copies this assay can detect is 131 copies/mL. A negative result does not preclude SARS-Cov-2 infection and should not be used as the sole basis for treatment or other patient management decisions. A negative result may occur with  improper specimen collection/handling, submission of specimen other than nasopharyngeal swab, presence of viral mutation(s) within the areas targeted by this assay, and inadequate number of viral copies (<131 copies/mL). A negative result must be combined with  clinical observations, patient history, and epidemiological information. The expected result is Negative. Fact Sheet for Patients:  PinkCheek.be Fact Sheet for Healthcare Providers:  GravelBags.it This test is not yet ap proved or cleared by the [place] FDA and  has been authorized for detection and/or diagnosis of SARS-CoV-2 by FDA under an Emergency Use Authorization (EUA). This EUA will remain  in  effect (meaning this test can be used) for the duration of the COVID-19 declaration under Section 564(b)(1) of the Act, 21 U.S.C. section 360bbb-3(b)(1), unless the authorization is terminated or revoked sooner.    Influenza A by PCR NEGATIVE NEGATIVE Final   Influenza B by PCR NEGATIVE NEGATIVE Final    Comment: (NOTE) The Xpert Xpress SARS-CoV-2/FLU/RSV assay is intended as an aid in  the diagnosis of influenza from Nasopharyngeal swab specimens and  should not be used as a sole basis for treatment. Nasal washings and  aspirates are unacceptable for Xpert Xpress SARS-CoV-2/FLU/RSV  testing. Fact Sheet for Patients: PinkCheek.be Fact Sheet for Healthcare Providers: GravelBags.it This test is not yet approved or cleared by the [place] FDA and  has been authorized for detection and/or diagnosis of SARS-CoV-2 by  FDA under an Emergency Use Authorization (EUA). This EUA will remain  in effect (meaning this test can be used) for the duration of the  Covid-19 declaration under Section 564(b)(1) of the Act, 21  U.S.C. section 360bbb-3(b)(1), unless the authorization is  terminated or revoked. Performed at [facility], [place] [address], [place], [place] [address]      Radiological Exams on Admission: No results found.   Assessment/Plan Lower GI bleed with acute blood loss anemia: Patient presents with complaints of rectal bleeding since yesterday.   Hemoglobin last noted to be 10 g/dL with positive stool guaiacs.  Last noted to be 12.5 g/dL on 2/16.  Patient reports that her last colonoscopy was in 2011 by Dr. Benson Norway and she was noted to have diverticulosis at that time.  Suspecting diverticular bleed as a cause of symptoms.  She currently denies any further episodes of bleeding at this time. -Admit to a medical telemetry bed -Discontinue Protonix drip -Serial monitoring of H&H -Transfuse blood products as needed   Essential hypertension: Patient transiently noted to have blood pressures as low as 86/70.  Currently blood pressures maintained.  Home medications include metoprolol 100 mg daily and triamterene hydrochlorothiazide -Continue metoprolol -Restart triamterene hydrochlorothiazide when medically appropriate  DVT prophylaxis: SCDs Code Status: Full Family Communication: Discussed plan of care with the patient's son present at bedside. Disposition Plan: Likely discharge home once Consults called: GI  Admission status: Observation  Norval Morton MD [facility] Pager [phone]   If 7PM-7AM, please contact night-coverage [email or web address] Password [facility]  11/06/2019, 10:34 AM

## 2019-11-06 NOTE — ED Notes (Signed)
Lunch Tray Ordered @1144. 

## 2019-11-06 NOTE — ED Provider Notes (Signed)
[facility] Emergency Department Provider Note MRN:  [id number]  Arrival date & time: 11/06/19     Chief Complaint   Rectal Bleeding   History of Present Illness   Ann Barton is a 64 y.o. year-old female with a history of hypertension presenting to the ED with chief complaint of rectal bleeding.  10 episodes of grossly bloody stool yesterday, four today so far.  Painless, no rectal pain, no abdominal pain.  Denies chest pain or shortness of breath.  No recent fever, no vomiting, no nausea.  No blood thinners, has never had this happen before.  Blood looks dark red.  Review of Systems  A complete 10 system review of systems was obtained and all systems are negative except as noted in the HPI and PMH.   Patient's Health History    Past Medical History:  Diagnosis Date  . Allergy    seasonal  . Arthritis    left hip, low back  . Atrophic vaginitis 01/25/2011  . Bladder prolapse, female, acquired 05/16/2013  . Chicken pox as a child  . Compression fracture of fourth lumbar vertebra ([place]) 04/25/2018  . Cough 04/19/2012  . 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 Goiter   . Hypertension   . Measles as a child  . Mumps as a child  . Neutropenia 03/10/2011  . Other and unspecified hyperlipidemia 08/23/2013  . Other malaise and fatigue 08/26/2013  . Pharyngitis, acute 03/09/2011  . Urinary frequency 04/27/2011  . Vaginitis 04/27/2011    Past Surgical History:  Procedure Laterality Date  . BIOPSY THYROID     FNA, benign  . BREAST BIOPSY Left    FNA, benign  . CHOLECYSTECTOMY  1998  . HERNIA REPAIR Right    right, inguinal  . in grown toenails removed     partial, b/l  . laser surgery on vericose veins     b/l  . NASAL SEPTUM SURGERY  2015  . TONSILLECTOMY  as a child  . VAGINAL HYSTERECTOMY  1988   done for polapse    Family History  Problem Relation Age of Onset  . Atrial fibrillation Mother   . Obesity Mother   . Diabetes Mother 74   Type 2  . Arthritis Mother        in right knee  . Heart disease Mother        atrial fibrillation, CHF  . Hernia Mother        never recovered from repair  . Hypertension Father   . Ulcers Father   . Pulmonary fibrosis Father   . Diabetes Maternal Grandmother        type 2?  . Heart attack Maternal Grandmother   . Hearing loss Maternal Grandmother        MI  . Parkinsonism Maternal Grandfather   . Stroke Paternal Grandmother     Social History   Socioeconomic History  . Marital status: Widowed    Spouse name: Not on file  . Number of children: 2  . Years of education: Not on file  . Highest education level: Not on file  Occupational History  . Occupation: NICU    Employer: [employer]  Tobacco Use  . Smoking status: Former Smoker    Packs/day: 1.00    Years: 5.00    Pack years: 5.00    Types: Cigarettes    Start date: 49    Quit date: 07/12/1976    Years since quitting: 43.3  . Smokeless  tobacco: Never Used  Substance and Sexual Activity  . Alcohol use: Yes    Comment: occasionally  . Drug use: No  . Sexual activity: Yes    Partners: Male    Comment: no dietary restrictions. but does minimize refined sugar  Other Topics Concern  . Not on file  Social History Narrative  . Not on file   Social Determinants of Health   Financial Resource Strain:   . Difficulty of Paying Living Expenses:   Food Insecurity:   . Worried About Charity fundraiser in the Last Year:   . Arboriculturist in the Last Year:   Transportation Needs:   . Film/video editor (Medical):   Marland Kitchen Lack of Transportation (Non-Medical):   Physical Activity:   . Days of Exercise per Week:   . Minutes of Exercise per Session:   Stress:   . Feeling of Stress :   Social Connections:   . Frequency of Communication with Friends and Family:   . Frequency of Social Gatherings with Friends and Family:   . Attends Religious Services:   . Active Member of Clubs or Organizations:   . Attends English as a second language teacher Meetings:   Marland Kitchen Marital Status:   Intimate Partner Violence:   . Fear of Current or Ex-Partner:   . Emotionally Abused:   Marland Kitchen Physically Abused:   . Sexually Abused:      Physical Exam   Vitals:   11/06/19 0553 11/06/19 0908  BP: 120/80 119/66  Pulse: 68 83  Resp: 17 16  Temp: 98 F (36.7 C)   SpO2: 98% 100%    CONSTITUTIONAL: Well-appearing, NAD NEURO:  Alert and oriented x 3, no focal deficits EYES:  eyes equal and reactive ENT/NECK:  no LAD, no JVD CARDIO: Regular rate, well-perfused, normal S1 and S2 PULM:  CTAB no wheezing or rhonchi GI/GU:  normal bowel sounds, non-distended, non-tender, normal rectal tone, gross dark blood MSK/SPINE:  No gross deformities, no edema SKIN:  no rash, atraumatic PSYCH:  Appropriate speech and behavior  *Additional and/or pertinent findings included in MDM below  Diagnostic and Interventional Summary    EKG Interpretation  Date/Time:    Ventricular Rate:    PR Interval:    QRS Duration:   QT Interval:    QTC Calculation:   R Axis:     Text Interpretation:        Labs Reviewed  COMPREHENSIVE METABOLIC PANEL - Abnormal; Notable for the following components:      Result Value   Glucose, Bld 128 (*)    Calcium 8.6 (*)    Total Protein 6.4 (*)    All other components within normal limits  CBC - Abnormal; Notable for the following components:   RBC 3.50 (*)    Hemoglobin 10.0 (*)    HCT 31.6 (*)    All other components within normal limits  RESPIRATORY PANEL BY RT PCR (FLU A&B, COVID)  POC OCCULT BLOOD, ED  TYPE AND SCREEN  ABO/RH    No orders to display    Medications  pantoprazole (PROTONIX) 80 mg in sodium chloride 0.9 % 100 mL (0.8 mg/mL) infusion (8 mg/hr Intravenous New Bag/Given 11/06/19 1009)  sodium chloride 0.9 % bolus 1,000 mL (0 mLs Intravenous Stopped 11/06/19 0939)  pantoprazole (PROTONIX) 80 mg in sodium chloride 0.9 % 100 mL IVPB (0 mg Intravenous Stopped 11/06/19 1008)     Procedures  /   Critical Care .Critical Care Performed by: Sedonia Small,  Barth Kirks, MD Authorized by: Maudie Flakes, MD   Critical care provider statement:    Critical care time (minutes):  32   Critical care was necessary to treat or prevent imminent or life-threatening deterioration of the following conditions: Lower GI bleed with hypotension.   Critical care was time spent personally by me on the following activities:  Discussions with consultants, evaluation of patient's response to treatment, examination of patient, ordering and performing treatments and interventions, ordering and review of laboratory studies, ordering and review of radiographic studies, pulse oximetry, re-evaluation of patient's condition, obtaining history from patient or surrogate and review of old charts    ED Course and Medical Decision Making  I have reviewed the triage vital signs, the nursing notes, and pertinent available records from the EMR.  Listed above are laboratory and imaging tests that I personally ordered, reviewed, and interpreted and then considered in my medical decision making (see below for details).      Painless GI bleed, suspect diverticular bleed.  Nontender abdomen.  Had transient hypotension while in the waiting room, providing fluids, Protonix, will consult gastroenterology and admit to the hospital service.    Barth Kirks. Sedonia Small, MD [place] [email or web address]  Final Clinical Impressions(s) / ED Diagnoses     ICD-10-CM   1. Lower GI bleed  K92.2     ED Discharge Orders    None       Discharge Instructions Discussed with and Provided to Patient:   Discharge Instructions   None       Maudie Flakes, MD 11/06/19 1020

## 2019-11-07 DIAGNOSIS — K5790 Diverticulosis of intestine, part unspecified, without perforation or abscess without bleeding: Secondary | ICD-10-CM

## 2019-11-07 DIAGNOSIS — K922 Gastrointestinal hemorrhage, unspecified: Secondary | ICD-10-CM

## 2019-11-07 DIAGNOSIS — D62 Acute posthemorrhagic anemia: Principal | ICD-10-CM

## 2019-11-07 DIAGNOSIS — I10 Essential (primary) hypertension: Secondary | ICD-10-CM | POA: Diagnosis not present

## 2019-11-07 LAB — BASIC METABOLIC PANEL
Anion gap: 9 (ref 5–15)
BUN: 9 mg/dL (ref 8–23)
CO2: 22 mmol/L (ref 22–32)
Calcium: 8.2 mg/dL — ABNORMAL LOW (ref 8.9–10.3)
Chloride: 113 mmol/L — ABNORMAL HIGH (ref 98–111)
Creatinine, Ser: 0.61 mg/dL (ref 0.44–1.00)
GFR calc Af Amer: >60 mL/min (ref >60)
GFR calc non Af Amer: >60 mL/min (ref >60)
Glucose, Bld: 106 mg/dL — ABNORMAL HIGH (ref 70–99)
Potassium: 3.6 mmol/L (ref 3.5–5.1)
Sodium: 144 mmol/L (ref 135–145)

## 2019-11-07 LAB — CBC
HCT: 21.1 % — ABNORMAL LOW (ref 36.0–46.0)
Hemoglobin: 6.8 g/dL — CL (ref 12.0–15.0)
MCH: 29.2 pg (ref 26.0–34.0)
MCHC: 32.2 g/dL (ref 30.0–36.0)
MCV: 90.6 fL (ref 80.0–100.0)
Platelets: 185 10*3/uL (ref 150–400)
RBC: 2.33 MIL/uL — ABNORMAL LOW (ref 3.87–5.11)
RDW: 13 % (ref 11.5–15.5)
WBC: 5.4 10*3/uL (ref 4.0–10.5)
nRBC: 0 % (ref 0.0–0.2)

## 2019-11-07 LAB — PREPARE RBC (CROSSMATCH)

## 2019-11-07 MED ORDER — SODIUM CHLORIDE 0.9% IV SOLUTION: Freq: Once | INTRAVENOUS | Status: AC

## 2019-11-07 NOTE — Plan of Care (Signed)
  Problem: Education: Goal: Knowledge of General Education information will improve Description Including pain rating scale, medication(s)/side effects and non-pharmacologic comfort measures Outcome: Progressing   

## 2019-11-07 NOTE — Consult Note (Signed)
[facility] Consult: 8:28 AM 11/07/2019  LOS: 1 day    Referring Provider: Dr Ree Kida  Primary Care Physician:  Mosie Lukes, MD Primary Gastroenterologist:  Dr Benson Norway but pt not wanting to see hime     Reason for Consultation:  Hematochezia, anemia   HPI: Ann Barton is a 64 y.o. female.  Patient is a Immunologist and works weekend option.   Colonoscopy age 74, diverticulosis noted..  Negative cologuard negative in 05/2018 Presented last PM to ED w painless hematochezia, started Monday, 2 d ago.  At least 10 episodes Monday, ~ 4, more than 14 hours ago.  Stools medium to dark red color.   No palpitations, positional dizziness on a couple of occasions.  No nausea.  No prior history of even minor bleeding.  Takes low-dose aspirin daily, Excedrin headache formula 2 or 3 times weekly.   Hgb 10 >> 7.3 >> 6.8.  Was 12.5 on 08/28/19.   Normal BUN/creat.    Past Medical History:  Diagnosis Date  . Allergy    seasonal  . Arthritis    left hip, low back  . Atrophic vaginitis 01/25/2011  . Bladder prolapse, female, acquired 05/16/2013  . Chicken pox as a child  . Compression fracture of fourth lumbar vertebra ([place]) 04/25/2018  . Cough 04/19/2012  . 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 Goiter   . Hypertension   . Measles as a child  . Mumps as a child  . Neutropenia 03/10/2011  . Other and unspecified hyperlipidemia 08/23/2013  . Other malaise and fatigue 08/26/2013  . Pharyngitis, acute 03/09/2011  . Urinary frequency 04/27/2011  . Vaginitis 04/27/2011    Past Surgical History:  Procedure Laterality Date  . BIOPSY THYROID     FNA, benign  . BREAST BIOPSY Left    FNA, benign  . CHOLECYSTECTOMY  1998  . HERNIA REPAIR Right    right, inguinal  . in grown toenails removed     partial, b/l  . laser  surgery on vericose veins     b/l  . NASAL SEPTUM SURGERY  2015  . TONSILLECTOMY  as a child  . VAGINAL HYSTERECTOMY  1988   done for polapse    Prior to Admission medications   Medication Sig Start Date End Date Taking? Authorizing Provider  Calcium Carbonate-Vit D-Min (CALCIUM 600+D PLUS MINERALS) 600-400 MG-UNIT TABS Take by mouth.   Yes [provider]  Cholecalciferol (VITAMIN D3) 5000 UNITS CAPS Take 1 capsule by mouth daily.    Yes [provider]  Coenzyme Q10 (COQ-10) 200 MG CAPS Take 1 tablet by mouth daily.    Yes [provider]  metoprolol succinate (TOPROL-XL) 100 MG 24 hr tablet Take 1 tablet (100 mg total) by mouth daily. Take with or immediately following a meal. 08/28/19  Yes Mosie Lukes, MD  Multiple Vitamin (MULTIVITAMIN) tablet Take 1 tablet by mouth daily.     Yes [provider]  Probiotic Product (PROBIOTIC DAILY PO) Take 1 tablet by mouth daily.    Yes [provider]  Red Yeast Rice Extract (RED YEAST RICE PO) Take 1 capsule by mouth daily.   Yes [provider]  triamterene-hydrochlorothiazide (MAXZIDE-25) 37.5-25 MG tablet Take 1 tablet by mouth daily. 07/21/17  Yes Mosie Lukes, MD  zolpidem (AMBIEN) 10 MG tablet Take 1 tablet (10 mg total) by mouth at bedtime as needed. for sleep 08/28/19  Yes Mosie Lukes, MD    Scheduled Meds: . sodium chloride   Intravenous Once  . acidophilus  1 capsule Oral Daily  . metoprolol succinate  100 mg Oral Daily  . sodium chloride flush  3 mL Intravenous Q12H   Infusions: . sodium chloride 75 mL/hr at 11/06/19 2033   PRN Meds: acetaminophen **OR** acetaminophen, albuterol, ondansetron **OR** ondansetron (ZOFRAN) IV, zolpidem   Allergies as of 11/05/2019 - Review Complete 11/05/2019  Allergen Reaction Noted  . Lisinopril Cough 01/25/2011    Family History  Problem Relation Age of Onset  . Atrial fibrillation Mother   . Obesity Mother   . Diabetes Mother  73       Type 2  . Arthritis Mother        in right knee  . Heart disease Mother        atrial fibrillation, CHF  . Hernia Mother        never recovered from repair  . Hypertension Father   . Ulcers Father   . Pulmonary fibrosis Father   . Diabetes Maternal Grandmother        type 2?  . Heart attack Maternal Grandmother   . Hearing loss Maternal Grandmother        MI  . Parkinsonism Maternal Grandfather   . Stroke Paternal Grandmother     Social History   Socioeconomic History  . Marital status: Widowed    Spouse name: Not on file  . Number of children: 2  . Years of education: Not on file  . Highest education level: Not on file  Occupational History  . Occupation: NICU    Employer: [employer]  Tobacco Use  . Smoking status: Former Smoker    Packs/day: 1.00    Years: 5.00    Pack years: 5.00    Types: Cigarettes    Start date: 14    Quit date: 07/12/1976    Years since quitting: 43.3  . Smokeless tobacco: Never Used  Substance and Sexual Activity  . Alcohol use: Yes    Comment: occasionally  . Drug use: No  . Sexual activity: Yes    Partners: Male    Comment: no dietary restrictions. but does minimize refined sugar  Other Topics Concern  . Not on file  Social History Narrative  . Not on file   Social Determinants of Health   Financial Resource Strain:   . Difficulty of Paying Living Expenses:   Food Insecurity:   . Worried About Charity fundraiser in the Last Year:   . Arboriculturist in the Last Year:   Transportation Needs:   . Film/video editor (Medical):   Marland Kitchen Lack of Transportation (Non-Medical):   Physical Activity:   . Days of Exercise per Week:   . Minutes of Exercise per Session:   Stress:   . Feeling of Stress :   Social Connections:   . Frequency of Communication with Friends and Family:   . Frequency of Social Gatherings with Friends and Family:   . Attends Religious Services:   . Active Member of Clubs  or Organizations:   .  Attends Archivist Meetings:   Marland Kitchen Marital Status:   Intimate Partner Violence:   . Fear of Current or Ex-Partner:   . Emotionally Abused:   Marland Kitchen Physically Abused:   . Sexually Abused:     REVIEW OF SYSTEMS: Constitutional: Feels well today.  No fatigue. ENT:  No nose bleeds Pulm: No shortness of breath, no cough. CV:  No palpitations, no LE edema.  No angina. GU:  No hematuria, no frequency GI: See HPI. Heme: No excessive or unusual bleeding/bruising. Transfusions: None.  About to receive her first PRBC now. Neuro: Stress related headaches, no peripheral tingling or numbness.  No syncope, no seizures. Derm:  No itching, no rash or sores.  Endocrine:  No sweats or chills.  No polyuria or dysuria Immunization: Completed 2 series of COVID-19 vaccination. Psych/emotional: Feels under stress trying to sell her house and look for another house to purchase.   PHYSICAL EXAM: Vital signs in last 24 hours: Vitals:   11/07/19 0415 11/07/19 0756  BP: 124/79 130/75  Pulse: 72 75  Resp:  18  Temp: 98 F (36.7 C) 98.1 F (36.7 C)  SpO2: 96% 99%   Wt Readings from Last 3 Encounters:  08/28/19 69.9 kg  05/25/18 73.5 kg  04/19/18 74.4 kg    General: Pleasant, calm, comfortable.  Looks a little bit pale. Head: No facial asymmetry or swelling.  No signs of head trauma. Eyes: No conjunctival pallor or scleral icterus.  EOMI. Ears: Not hard of hearing Nose: No congestion or discharge Mouth: Good dentition.  Oral mucosa pink and moist, clear.  Tongue midline. Neck: No JVD, no masses, no thyromegaly. Lungs: Clear bilaterally.  No labored breathing or cough. Heart: RRR.  No MRG.  S1, S2 present. Abdomen: Soft.  NT, ND.  No HSM, masses, bruits, hernias..   Rectal: Deferred Musc/Skeltl: No joint redness, swelling or gross deformity. Extremities: No CCE. Neurologic: Oriented x3.  Fully alert.  Good historian.  No limb weakness, no tremors. Skin: No rash, no sores, no suspicious  lesions. Tattoos: None observed. Nodes: No cervical adenopathy Psych: Pleasant, calm, fluid speech, cooperative.  Intake/Output from previous day: 04/27 0701 - 04/28 0700 In: 2575.7 [P.O.:240; I.V.:1335.7; IV Piggyback:1000] Out: 0  Intake/Output this shift: No intake/output data recorded.  LAB RESULTS: Recent Labs    11/05/19 2057 11/06/19 2040 11/07/19 0358  WBC 9.1  --  5.4  HGB 10.0* 7.3* 6.8*  HCT 31.6* 22.8* 21.1*  PLT 266  --  185   BMET Lab Results  Component Value Date   NA 144 11/07/2019   NA 135 11/05/2019   NA 137 08/28/2019   K 3.6 11/07/2019   K 3.6 11/05/2019   K 3.6 08/28/2019   CL 113 (H) 11/07/2019   CL 102 11/05/2019   CL 101 08/28/2019   CO2 22 11/07/2019   CO2 23 11/05/2019   CO2 30 08/28/2019   GLUCOSE 106 (H) 11/07/2019   GLUCOSE 128 (H) 11/05/2019   GLUCOSE 95 08/28/2019   BUN 9 11/07/2019   BUN 17 11/05/2019   BUN 20 08/28/2019   CREATININE 0.61 11/07/2019   CREATININE 0.79 11/05/2019   CREATININE 0.74 08/28/2019   CALCIUM 8.2 (L) 11/07/2019   CALCIUM 8.6 (L) 11/05/2019   CALCIUM 9.7 08/28/2019   LFT Recent Labs    11/05/19 2057  PROT 6.4*  ALBUMIN 3.7  AST 16  ALT 15  ALKPHOS 42  BILITOT 0.5   PT/INR No  results found for: INR, PROTIME Hepatitis Panel No results for input(s): HEPBSAG, HCVAB, HEPAIGM, HEPBIGM in the last 72 hours. C-Diff No components found for: CDIFF Lipase  No results found for: LIPASE  Drugs of Abuse  No results found for: LABOPIA, COCAINSCRNUR, LABBENZ, AMPHETMU, THCU, LABBARB   RADIOLOGY STUDIES: No results found.    IMPRESSION:   *   Painless hematochezia.  Suspect diverticular bleed  *   Blood loss anemia.  pndg transfusion w 1 PRBC    PLAN:     *  Observe for now.  Bleeding seems to have stopped.  Does she need colonoscopy?  And if so, when? Advance to regular diet   Azucena Freed  11/07/2019, 8:28 AM Phone [phone]

## 2019-11-07 NOTE — Progress Notes (Signed)
PROGRESS NOTE    Ann Barton  [id number] DOB: June 06, 1956 DOA: 11/05/2019 PCP: Mosie Lukes, MD   Brief Narrative:  HPI On 11/06/2019 by Dr. Fuller Plan Ann Barton is a 64 y.o. female with medical history significant of essential hypertension and arthritis who presented to the emergency department with reports of blood in her stool.  She reports having at least 10 episodes prior to coming to the emergency department yesterday with blood clots present.  She subsequently had an additional form bloody bowel movements while waiting to be seen.  Denies having any abdominal pain, shortness of breath, nausea, vomiting, or lightheadedness.  Patient last colonoscopy was in 2011 performed by Dr. Benson Norway where she was noted to have diverticulosis.  She had the Cologuard test performed last year and it was noted to be negative.  She has never had symptoms like this before. Denies any use of any NSAIDs and is not on any blood thinners  Interim history Patient being admitted for a lower GI bleed with acute blood loss anemia.  Gastroenterology consulted and appreciated.  Assessment & Plan   Lower GI bleed/acute blood loss anemia/symptomatic anemia -Patient stated she had rectal bleeding which started the day prior to admission -Has had some dizziness -Hemoglobin dropped to 6.8 (hemoglobin was 12.5 on 08/28/2019) -Blood transfusion has been ordered -FOBT positive -Patient had colonoscopy in 2011 by Dr. Benson Norway and was noted to have diverticulosis at that time.  She tells me she also had a Cologuard test ordered by her PCP which was unremarkable. -She does admit to using Excedrin Migraine approximately 2-3 times per week along with the baby aspirin daily -Patient has not noted any further episodes of bleeding since admission -Patient was placed on a Protonix drip however this was discontinued prior to admission -Gastroenterology consulted and appreciated -Continue to monitor CBC  Essential  hypertension -Patient has had soft BPs -Home medications of triamterene/HCTZ currently held -Continue metoprolol with holding parameters   DVT Prophylaxis  SCDs  Code Status: Full  Family Communication: None at bedside  Disposition Plan:  Status is: Observation  Dispo: The patient is from: Home              Anticipated d/c is to: Home              Anticipated d/c date is: 1 day              Patient currently is not medically stable to d/c. Patient admitted for GI bleeding and noted to have drop in hemoglobin to 6.8 from 10 on admission.  Requiring blood transfusion at this time.  Has complained of some dizziness.  Suspect home when gastroenterology has signed off.  Consultants Gastroenterology  Procedures  None  Antibiotics   Anti-infectives (From admission, onward)   None      Subjective:   [name] seen and examined today.  Patient denies any abdominal pain, chest pain or shortness of breath at this time.  Did have some dizziness yesterday.  Denies current nausea or vomiting.  Has had no further blood per rectum since admission.  States she was passing clots.  Objective:   Vitals:   11/07/19 0415 11/07/19 0756 11/07/19 0846 11/07/19 0901  BP: 124/79 130/75 130/78 128/77  Pulse: 72 75 74 74  Resp:  18 15 16   Temp: 98 F (36.7 C) 98.1 F (36.7 C) 98.6 F (37 C) 98.6 F (37 C)  TempSrc:  Oral Oral Oral  SpO2: 96% 99%  99% 96%  Height:        Intake/Output Summary (Last 24 hours) at 11/07/2019 1058 Last data filed at 11/07/2019 0634 Gross per 24 hour  Intake 1575.7 ml  Output 0 ml  Net 1575.7 ml   There were no vitals filed for this visit.  Exam  General: Well developed, well nourished, NAD, appears stated age  61: NCAT, mucous membranes moist.   Cardiovascular: S1 S2 auscultated, RRR, no murmur  Respiratory: Clear to auscultation bilaterally with equal chest rise  Abdomen: Soft, nontender, nondistended, + bowel sounds  Extremities: warm dry  without cyanosis clubbing or edema  Neuro: AAOx3, nonfocal  Psych: Appropriate mood and affect, pleasant   Data Reviewed: I have personally reviewed following labs and imaging studies  CBC: Recent Labs  Lab 11/05/19 2057 11/06/19 2040 11/07/19 0358  WBC 9.1  --  5.4  HGB 10.0* 7.3* 6.8*  HCT 31.6* 22.8* 21.1*  MCV 90.3  --  90.6  PLT 266  --  [id number]   Basic Metabolic Panel: Recent Labs  Lab 11/05/19 2057 11/07/19 0358  NA 135 144  K 3.6 3.6  CL 102 113*  CO2 23 22  GLUCOSE 128* 106*  BUN 17 9  CREATININE 0.79 0.61  CALCIUM 8.6* 8.2*   GFR: CrCl cannot be calculated (Unknown ideal weight.). Liver Function Tests: Recent Labs  Lab 11/05/19 2057  AST 16  ALT 15  ALKPHOS 42  BILITOT 0.5  PROT 6.4*  ALBUMIN 3.7   No results for input(s): LIPASE, AMYLASE in the last 168 hours. No results for input(s): AMMONIA in the last 168 hours. Coagulation Profile: No results for input(s): INR, PROTIME in the last 168 hours. Cardiac Enzymes: No results for input(s): CKTOTAL, CKMB, CKMBINDEX, TROPONINI in the last 168 hours. BNP (last 3 results) No results for input(s): PROBNP in the last 8760 hours. HbA1C: No results for input(s): HGBA1C in the last 72 hours. CBG: No results for input(s): GLUCAP in the last 168 hours. Lipid Profile: No results for input(s): CHOL, HDL, LDLCALC, TRIG, CHOLHDL, LDLDIRECT in the last 72 hours. Thyroid Function Tests: No results for input(s): TSH, T4TOTAL, FREET4, T3FREE, THYROIDAB in the last 72 hours. Anemia Panel: No results for input(s): VITAMINB12, FOLATE, FERRITIN, TIBC, IRON, RETICCTPCT in the last 72 hours. Urine analysis:    Component Value Date/Time   COLORURINE TEST NOT PERFORMED 08/23/2013 1322   APPEARANCEUR TEST NOT PERFORMED 08/23/2013 1322   LABSPEC TEST NOT PERFORMED 08/23/2013 1322   PHURINE TEST NOT PERFORMED 08/23/2013 1322   GLUCOSEU TEST NOT PERFORMED 08/23/2013 1322   HGBUR TEST NOT PERFORMED 08/23/2013 1322    BILIRUBINUR neg 02/17/2018 1457   KETONESUR TEST NOT PERFORMED 08/23/2013 1322   PROTEINUR Positive (A) 02/17/2018 1457   PROTEINUR TEST NOT PERFORMED 08/23/2013 1322   UROBILINOGEN 0.2 02/17/2018 1457   UROBILINOGEN TEST NOT PERFORMED 08/23/2013 1322   NITRITE positive 02/17/2018 1457   NITRITE TEST NOT PERFORMED 08/23/2013 1322   LEUKOCYTESUR Large (3+) (A) 02/17/2018 1457   Sepsis Labs: @LABRCNTIP (procalcitonin:4,lacticidven:4)  ) Recent Results (from the past 240 hour(s))  Respiratory Panel by RT PCR (Flu A&B, Covid) - Nasopharyngeal Swab     Status: None   Collection Time: 11/06/19  9:19 AM   Specimen: Nasopharyngeal Swab  Result Value Ref Range Status   SARS Coronavirus 2 by RT PCR NEGATIVE NEGATIVE Final    Comment: (NOTE) SARS-CoV-2 target nucleic acids are NOT DETECTED. The SARS-CoV-2 RNA is generally detectable in upper respiratoy specimens during  the acute phase of infection. The lowest concentration of SARS-CoV-2 viral copies this assay can detect is 131 copies/mL. A negative result does not preclude SARS-Cov-2 infection and should not be used as the sole basis for treatment or other patient management decisions. A negative result may occur with  improper specimen collection/handling, submission of specimen other than nasopharyngeal swab, presence of viral mutation(s) within the areas targeted by this assay, and inadequate number of viral copies (<131 copies/mL). A negative result must be combined with clinical observations, patient history, and epidemiological information. The expected result is Negative. Fact Sheet for Patients:  PinkCheek.be Fact Sheet for Healthcare Providers:  GravelBags.it This test is not yet ap proved or cleared by the [place] FDA and  has been authorized for detection and/or diagnosis of SARS-CoV-2 by FDA under an Emergency Use Authorization (EUA). This EUA will remain  in  effect (meaning this test can be used) for the duration of the COVID-19 declaration under Section 564(b)(1) of the Act, 21 U.S.C. section 360bbb-3(b)(1), unless the authorization is terminated or revoked sooner.    Influenza A by PCR NEGATIVE NEGATIVE Final   Influenza B by PCR NEGATIVE NEGATIVE Final    Comment: (NOTE) The Xpert Xpress SARS-CoV-2/FLU/RSV assay is intended as an aid in  the diagnosis of influenza from Nasopharyngeal swab specimens and  should not be used as a sole basis for treatment. Nasal washings and  aspirates are unacceptable for Xpert Xpress SARS-CoV-2/FLU/RSV  testing. Fact Sheet for Patients: PinkCheek.be Fact Sheet for Healthcare Providers: GravelBags.it This test is not yet approved or cleared by the [place] FDA and  has been authorized for detection and/or diagnosis of SARS-CoV-2 by  FDA under an Emergency Use Authorization (EUA). This EUA will remain  in effect (meaning this test can be used) for the duration of the  Covid-19 declaration under Section 564(b)(1) of the Act, 21  U.S.C. section 360bbb-3(b)(1), unless the authorization is  terminated or revoked. Performed at [facility], [place] [address] [place], [place] [address]       Radiology Studies: No results found.   Scheduled Meds: . acidophilus  1 capsule Oral Daily  . metoprolol succinate  100 mg Oral Daily  . sodium chloride flush  3 mL Intravenous Q12H   Continuous Infusions: . sodium chloride 75 mL/hr at 11/06/19 2033     LOS: 1 day   Time Spent in minutes   45 minutes  Euclid Cassetta D.O. on 11/07/2019 at 10:58 AM  Between 7am to 7pm - Please see pager noted on [email or web address]  After 7pm go to [email or web address]  And look for the night coverage person covering for me after hours  [facility] Office  [phone]

## 2019-11-07 NOTE — Progress Notes (Signed)
CRITICAL VALUE ALERT  Critical Value: 6.8  Date & Time Notied:  11/07/19 at 0459  Provider Notified: MD aware, see orders  Orders Received/Actions taken: Orders received in Epic.

## 2019-11-07 NOTE — Progress Notes (Signed)
New Admission Note: ? Arrival Method: Stretcher Mental Orientation: Alert and Oriented x4 Telemetry: Box [id number]  Assessment: Completed Skin: Refer to flowsheet IV: Right Antecubital Pain: 5/10 medication given Tubes: None Safety Measures: Safety Fall Prevention Plan discussed with patient. Admission: Completed [facility] [place] Orientation: Patient has been orientated to the room, unit and the staff. Family: None at the bedside at this time Orders have been reviewed and are being implemented. Will continue to monitor the patient. Call light has been placed within reach and bed alarm has been activated.  ? Milagros Loll, RN  Phone Number: [phone]

## 2019-11-08 ENCOUNTER — Encounter: Payer: Self-pay | Admitting: Gastroenterology

## 2019-11-08 DIAGNOSIS — K922 Gastrointestinal hemorrhage, unspecified: Secondary | ICD-10-CM | POA: Diagnosis not present

## 2019-11-08 DIAGNOSIS — D62 Acute posthemorrhagic anemia: Secondary | ICD-10-CM | POA: Diagnosis not present

## 2019-11-08 DIAGNOSIS — I10 Essential (primary) hypertension: Secondary | ICD-10-CM | POA: Diagnosis not present

## 2019-11-08 LAB — TYPE AND SCREEN
ABO/RH(D): O NEG
Antibody Screen: NEGATIVE
Unit division: 0

## 2019-11-08 LAB — CBC
HCT: 26.5 % — ABNORMAL LOW (ref 36.0–46.0)
Hemoglobin: 8.7 g/dL — ABNORMAL LOW (ref 12.0–15.0)
MCH: 28.8 pg (ref 26.0–34.0)
MCHC: 32.8 g/dL (ref 30.0–36.0)
MCV: 87.7 fL (ref 80.0–100.0)
Platelets: 204 10*3/uL (ref 150–400)
RBC: 3.02 MIL/uL — ABNORMAL LOW (ref 3.87–5.11)
RDW: 13.3 % (ref 11.5–15.5)
WBC: 6.9 10*3/uL (ref 4.0–10.5)
nRBC: 0 % (ref 0.0–0.2)

## 2019-11-08 LAB — BASIC METABOLIC PANEL
Anion gap: 8 (ref 5–15)
BUN: 7 mg/dL — ABNORMAL LOW (ref 8–23)
CO2: 25 mmol/L (ref 22–32)
Calcium: 8.3 mg/dL — ABNORMAL LOW (ref 8.9–10.3)
Chloride: 109 mmol/L (ref 98–111)
Creatinine, Ser: 0.66 mg/dL (ref 0.44–1.00)
GFR calc Af Amer: >60 mL/min (ref >60)
GFR calc non Af Amer: >60 mL/min (ref >60)
Glucose, Bld: 99 mg/dL (ref 70–99)
Potassium: 3.9 mmol/L (ref 3.5–5.1)
Sodium: 142 mmol/L (ref 135–145)

## 2019-11-08 LAB — BPAM RBC
Blood Product Expiration Date: 202105012359
ISSUE DATE / TIME: 202104280828
Unit Type and Rh: 9500

## 2019-11-08 NOTE — Progress Notes (Signed)
Ann Barton to be discharged Home per MD order. Discussed prescriptions and follow up appointments with the patient. Medication list explained in detail. Patient verbalized understanding.  Skin clean, dry and intact without evidence of skin break down, no evidence of skin tears noted. IV catheter discontinued intact. Site without signs and symptoms of complications. Dressing and pressure applied. Pt denies pain at the site currently. No complaints noted.  Patient free of lines, drains, and wounds.   An After Visit Summary (AVS) was printed and given to the patient. Patient escorted via wheelchair, and discharged home via private auto.  [name], RN

## 2019-11-08 NOTE — Discharge Summary (Signed)
Physician Discharge Summary  Ann Barton [id number] DOB: 1956/05/25 DOA: 11/05/2019  PCP: Mosie Lukes, MD  Admit date: 11/05/2019 Discharge date: 11/08/2019  Time spent: 45 minutes  Recommendations for Outpatient Follow-up:  Patient will be discharged to home.  Patient will need to follow up with primary care provider within one week of discharge, repeat CBC.  Follow-up with gastroenterology in 6 to 8 weeks for colonoscopy.  Patient should continue medications as prescribed.  Patient should follow a heart healthy diet.   Discharge Diagnoses:  Lower GI bleed/acute blood loss anemia/symptomatic anemia Essential hypertension  Discharge Condition: Stable  Diet recommendation: Heart healthy  There were no vitals filed for this visit.  History of present illness:  On 11/06/2019 by Dr. Buren Kos a 64 y.o.femalewith medical history significant ofessential hypertensionand arthritis who presented to the emergency department with reports of blood in her stool. She reports having at least 10 episodes prior to coming to the emergency department yesterday with blood clots present. She subsequently had an additional form bloody bowel movements while waiting to be seen. Denies having any abdominal pain, shortness of breath, nausea, vomiting, or lightheadedness. Patient last colonoscopy was in 2011performed by Dr. Solmon Ice she was noted to havediverticulosis. She had the Cologuard test performed last year and it was noted to be negative. She has never had symptoms like this before. Denies any use of any NSAIDsand is not on any blood thinners  Hospital Course:  Lower GI bleed/acute blood loss anemia/symptomatic anemia -Patient stated she had rectal bleeding which started the day prior to admission -Has had some dizziness -Hemoglobin dropped to 6.8 (hemoglobin was 12.5 on 08/28/2019) -Blood transfusion given, hemoglobin today 8.7 -FOBT positive -Patient had  colonoscopy in 2011 by Dr. Benson Norway and was noted to have diverticulosis at that time.  She tells me she also had a Cologuard test ordered by her PCP which was unremarkable. -She does admit to using Excedrin Migraine approximately 2-3 times per week along with the baby aspirin daily -Patient was placed on a Protonix drip however this was discontinued prior to admission -Gastroenterology consulted and appreciated-if patient with no further bleeding and good response to transfusion, can DC.  Outpatient colonoscopy in 6 - 8 weeks. -Patient had an episode of black stool with "reddish halo" surrounding it this morning. Discussed with GI, no further w/up needed, likely retained blood.  -Repeat CBC in one week  Essential hypertension -BP now stable -Home medications of triamterene/HCTZ currently held-may start on discharge -Continue metoprolol  Procedures: none  Consultations: Gastroenterology  Discharge Exam: Vitals:   11/08/19 0535 11/08/19 0922  BP: 138/78 135/68  Pulse: 69 78  Resp: 18 18  Temp: 98.5 F (36.9 C) 98.2 F (36.8 C)  SpO2: 94% 96%     General: Well developed, well nourished, NAD, appears stated age  HEENT: NCAT, mucous membranes moist.  Cardiovascular: S1 S2 auscultated, RRR, no murmur  Respiratory: Clear to auscultation bilaterally with equal chest rise  Abdomen: Soft, nontender, nondistended, + bowel sounds  Extremities: warm dry without cyanosis clubbing or edema  Neuro: AAOx3, nonfocal  Psych: Appropriate mood and affect, pleasant  Discharge Instructions Discharge Instructions    Discharge instructions   Complete by: As directed    Patient will be discharged to home.  Patient will need to follow up with primary care provider within one week of discharge, repeat CBC.  Follow-up with gastroenterology in 6 to 8 weeks for colonoscopy.  Patient should continue medications as prescribed.  Patient should follow a heart healthy diet.     Allergies as of  11/08/2019      Reactions   Lisinopril Cough      Medication List    TAKE these medications   Calcium 600+D Plus Minerals 600-400 MG-UNIT Tabs Take by mouth.   CoQ-10 200 MG Caps Take 1 tablet by mouth daily.   metoprolol succinate 100 MG 24 hr tablet Commonly known as: TOPROL-XL Take 1 tablet (100 mg total) by mouth daily. Take with or immediately following a meal.   multivitamin tablet Take 1 tablet by mouth daily.   PROBIOTIC DAILY PO Take 1 tablet by mouth daily.   RED YEAST RICE PO Take 1 capsule by mouth daily.   triamterene-hydrochlorothiazide 37.5-25 MG tablet Commonly known as: MAXZIDE-25 Take 1 tablet by mouth daily.   Vitamin D3 125 MCG (5000 UT) Caps Take 1 capsule by mouth daily.   zolpidem 10 MG tablet Commonly known as: AMBIEN Take 1 tablet (10 mg total) by mouth at bedtime as needed. for sleep      Allergies  Allergen Reactions  . Lisinopril Cough   Follow-up Information    Mosie Lukes, MD. Schedule an appointment as soon as possible for a visit in 1 week(s).   Specialty: Family Medicine Why: Hospital Follow up Contact information: [address] [place] [address] [phone]        Gerrit Heck V, DO Follow up.   Specialty: Gastroenterology Contact information: [place] [address] [phone]            The results of significant diagnostics from this hospitalization (including imaging, microbiology, ancillary and laboratory) are listed below for reference.    Significant Diagnostic Studies: No results found.  Microbiology: Recent Results (from the past 240 hour(s))  Respiratory Panel by RT PCR (Flu A&B, Covid) - Nasopharyngeal Swab     Status: None   Collection Time: 11/06/19  9:19 AM   Specimen: Nasopharyngeal Swab  Result Value Ref Range Status   SARS Coronavirus 2 by RT PCR NEGATIVE NEGATIVE Final    Comment: (NOTE) SARS-CoV-2 target nucleic acids are NOT  DETECTED. The SARS-CoV-2 RNA is generally detectable in upper respiratoy specimens during the acute phase of infection. The lowest concentration of SARS-CoV-2 viral copies this assay can detect is 131 copies/mL. A negative result does not preclude SARS-Cov-2 infection and should not be used as the sole basis for treatment or other patient management decisions. A negative result may occur with  improper specimen collection/handling, submission of specimen other than nasopharyngeal swab, presence of viral mutation(s) within the areas targeted by this assay, and inadequate number of viral copies (<131 copies/mL). A negative result must be combined with clinical observations, patient history, and epidemiological information. The expected result is Negative. Fact Sheet for Patients:  PinkCheek.be Fact Sheet for Healthcare Providers:  GravelBags.it This test is not yet ap proved or cleared by the [place] FDA and  has been authorized for detection and/or diagnosis of SARS-CoV-2 by FDA under an Emergency Use Authorization (EUA). This EUA will remain  in effect (meaning this test can be used) for the duration of the COVID-19 declaration under Section 564(b)(1) of the Act, 21 U.S.C. section 360bbb-3(b)(1), unless the authorization is terminated or revoked sooner.    Influenza A by PCR NEGATIVE NEGATIVE Final   Influenza B by PCR NEGATIVE NEGATIVE Final    Comment: (NOTE) The Xpert Xpress SARS-CoV-2/FLU/RSV assay  is intended as an aid in  the diagnosis of influenza from Nasopharyngeal swab specimens and  should not be used as a sole basis for treatment. Nasal washings and  aspirates are unacceptable for Xpert Xpress SARS-CoV-2/FLU/RSV  testing. Fact Sheet for Patients: PinkCheek.be Fact Sheet for Healthcare Providers: GravelBags.it This test is not yet approved or cleared  by the [place] FDA and  has been authorized for detection and/or diagnosis of SARS-CoV-2 by  FDA under an Emergency Use Authorization (EUA). This EUA will remain  in effect (meaning this test can be used) for the duration of the  Covid-19 declaration under Section 564(b)(1) of the Act, 21  U.S.C. section 360bbb-3(b)(1), unless the authorization is  terminated or revoked. Performed at [facility], [place] [address], [place],  [address]      Labs: Basic Metabolic Panel: Recent Labs  Lab 11/05/19 2057 11/07/19 0358 11/08/19 0503  NA 135 144 142  K 3.6 3.6 3.9  CL 102 113* 109  CO2 23 22 25   GLUCOSE 128* 106* 99  BUN 17 9 7*  CREATININE 0.79 0.61 0.66  CALCIUM 8.6* 8.2* 8.3*   Liver Function Tests: Recent Labs  Lab 11/05/19 2057  AST 16  ALT 15  ALKPHOS 42  BILITOT 0.5  PROT 6.4*  ALBUMIN 3.7   No results for input(s): LIPASE, AMYLASE in the last 168 hours. No results for input(s): AMMONIA in the last 168 hours. CBC: Recent Labs  Lab 11/05/19 2057 11/06/19 2040 11/07/19 0358 11/08/19 0635  WBC 9.1  --  5.4 6.9  HGB 10.0* 7.3* 6.8* 8.7*  HCT 31.6* 22.8* 21.1* 26.5*  MCV 90.3  --  90.6 87.7  PLT 266  --  185 204   Cardiac Enzymes: No results for input(s): CKTOTAL, CKMB, CKMBINDEX, TROPONINI in the last 168 hours. BNP: BNP (last 3 results) No results for input(s): BNP in the last 8760 hours.  ProBNP (last 3 results) No results for input(s): PROBNP in the last 8760 hours.  CBG: No results for input(s): GLUCAP in the last 168 hours.     Signed:  Cristal Ford  [facility] 11/08/2019, 9:30 AM

## 2019-11-08 NOTE — TOC Transition Note (Signed)
Transition of Care [facility]) - CM/SW Discharge Note   Patient Details  Name: Ann Barton MRN: [id number] Date of Birth: [birth year]  Transition of Care [facility]) CM/SW Contact:  Bartholomew Crews, RN Phone Number: [phone] 11/08/2019, 10:27 AM   Clinical Narrative:     Patient to transition home today. No HH or DME needs. Patient unable to get ride until later this afternoon. Arrangements made with [facility] transportation. Waiver signed and emailed to transportation. No further TOC needs identified.   Final next level of care: Home/Self Care Barriers to Discharge: No Barriers Identified   Patient Goals and CMS Choice     Choice offered to / list presented to : NA  Discharge Placement                       Discharge Plan and Services                DME Arranged: N/A DME Agency: NA       HH Arranged: NA HH Agency: NA        Social Determinants of Health (SDOH) Interventions     Readmission Risk Interventions No flowsheet data found.

## 2019-11-08 NOTE — Plan of Care (Signed)
  Problem: Education: Goal: Knowledge of General Education information will improve Description: Including pain rating scale, medication(s)/side effects and non-pharmacologic comfort measures Outcome: Completed/Met   Problem: Activity: Goal: Risk for activity intolerance will decrease Outcome: Completed/Met   

## 2019-11-08 NOTE — Discharge Instructions (Signed)

## 2019-11-09 ENCOUNTER — Telehealth: Payer: Self-pay | Admitting: *Deleted

## 2019-11-09 ENCOUNTER — Encounter: Payer: Self-pay | Admitting: *Deleted

## 2019-11-09 ENCOUNTER — Other Ambulatory Visit: Payer: Self-pay | Admitting: *Deleted

## 2019-11-09 NOTE — Patient Outreach (Signed)
[facility]) Care Management  11/09/2019  Ann Barton [birth year] [id number]   Transition of care call/case closure   Referral received:11/09/19 Initial outreach:11/09/19 Insurance: [place] [employer]   Subjective: Initial successful telephone call to patient's preferred number in order to complete transition of care assessment; 2 HIPAA identifiers verified. Explained purpose of call and completed transition of care assessment. Patient is currently in her car , talking on speaker phone, offered to return call later, she wanted to precede.  States she is doing okay. She denies noting any further blood in her stool. She states having one episode of cramping feeling but it is gone. She reports eating well, denies nausea. She reports having support from family member if needed in her recovery.  She discussed  ongoing health issues of hypertension but she is managing well  she does not need a referral to one of the [place] chronic disease management programs.  She is unsure if she has the hospital indemnity plan, she is agreeable to receiving email with contact information .  She  uses a [facility] outpatient [facility]      Objective:  Per electronic record Ann Barton   was hospitalized at [facility]  from 4/26-4/30/21 for Lower GI bleed  Comorbidities include: Hypertension . She  was discharged to home on 11/07/28 without the need for home health services or DME.   Assessment:  Patient voices good understanding of all discharge instructions.  See transition of care flowsheet for assessment details.   Plan:  Reviewed hospital discharge diagnosis of Lower GI bleed   and discharge treatment plan using hospital discharge instructions, assessing medication adherence, reviewing problems requiring provider notification, and discussing the importance of follow up with surgeon, primary care provider and/or specialists as directed. Reviewed [place]  healthy lifestyle program information to receive discounted premium for  2022   Step 1: Get  your annual physical  Step 2: Complete your health assessment between July 13, 2019 and March 12, 2020 at [email or web address]  Step 3:Identify your current health status and complete the corresponding action step between January 1, and March 12, 2020. Will send patient information on making contact with [place] [employer]  to determine if she has Hospital indemnity plan to per personal email address as requested.    No ongoing care management needs identified so will close case to [facility] [employer] services and route successful outreach letter with [facility] Management pamphlet and 24 Hour Nurse Line Magnet to [place] [facility] clinical pool to be mailed to patient's home address.  Joylene Draft, RN, BSN  [place] Management Coordinator  [phone]- Mobile [phone]- Toll Free Main Office

## 2019-11-09 NOTE — Telephone Encounter (Signed)
1st attempt. Unable to reach patient.   

## 2019-11-12 NOTE — Telephone Encounter (Signed)
I have made two attempts and have been unable to reach patient. Pt has hospital follow up scheduled w/ Dr.Copland 11/15/19.

## 2019-11-14 NOTE — Patient Instructions (Addendum)
It was good to see you today- I will be in touch with your labs asap!  Assuming your blood counts are trending in the right direction we will plan to have you see GI as scheduled, and you can return to work at your next shift.   If any concerns please contact us!

## 2019-11-14 NOTE — Progress Notes (Addendum)
[place] at [facility] [address], [place], [place] [address] [phone]  Date:  11/15/2019   Name:  Ann Barton   DOB:  May 03, 1956   MRN:  [id number]  PCP:  Mosie Lukes, MD    Chief Complaint: Hospitalization Follow-up   History of Present Illness:  Ann Barton is a 64 y.o. very pleasant female patient who presents with the following:  Here today for a hospital follow-up- she was recently admitted with a GI bleed.  I did see her once in 2019-her primary doctor is Dr. Charlett Blake It appears that she probably had a diverticular bleed.  Her hemoglobin got down to 6.8 and she received a blood transfusion  Admit date: 11/05/2019 Discharge date: 11/08/2019  Recommendations for Outpatient Follow-up:  Patient will be discharged to home.  Patient will need to follow up with primary care provider within one week of discharge, repeat CBC.  Follow-up with gastroenterology in 6 to 8 weeks for colonoscopy.  Patient should continue medications as prescribed.  Patient should follow a heart healthy diet.   Discharge Diagnoses:  Lower GI bleed/acute blood loss anemia/symptomatic anemia Essential hypertension  History of present illness:  On 11/06/2019 by Dr. Buren Kos a 64 y.o.femalewith medical history significant ofessential hypertensionand arthritis who presented to the emergency department with reports of blood in her stool. She reports having at least 10 episodes prior to coming to the emergency department yesterday with blood clots present. She subsequently had an additional form bloody bowel movements while waiting to be seen. Denies having any abdominal pain, shortness of breath, nausea, vomiting, or lightheadedness. Patient last colonoscopy was in 2011performed by Dr. Solmon Ice she was noted to havediverticulosis. She had the Cologuard test performed last year and it was noted to be negative. She has never  had symptoms like this before. Denies any use of any NSAIDsand is not on any blood thinners  Hospital Course:  Lower GI bleed/acute blood loss anemia/symptomatic anemia -Patient stated she had rectal bleeding which started the day prior to admission -Has had some dizziness -Hemoglobin dropped to 6.8(hemoglobin was 12.5 on 08/28/2019) -Blood transfusion given, hemoglobin today 8.7 -FOBT positive -Patient had colonoscopy in 2011 by Dr. Benson Norway and was noted to have diverticulosis at that time. She tells me she also had a Cologuard test ordered by her PCP which was unremarkable. -She does admit to using Excedrin Migraine approximately 2-3 times per week along with the baby aspirin daily -Patient was placed on a Protonix drip however this was discontinued prior to admission -Gastroenterology consulted and appreciated-if patient with no further bleeding and good response to transfusion, can DC.  Outpatient colonoscopy in 6 - 8 weeks. -Patient had an episode of black stool with "reddish halo" surrounding it this morning. Discussed with GI, no further w/up needed, likely retained blood.  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 Lower GI bleed 11/06/2019  . Acute blood loss anemia 11/06/2019  . Tinnitus of both ears 08/28/2019  . Pressure sensation in both ears 08/28/2019  . Bilateral carotid bruits 08/28/2019  . Osteoporosis 08/28/2019  . Muscle cramps 08/28/2019  . Compression  fracture of L4 vertebra ([place]) 11/07/2018  . Low back pain 05/28/2018  . Insomnia 05/23/2017  . Multiple food allergies 05/19/2017  . Other malaise and fatigue 08/26/2013  . High cholesterol 08/23/2013  . Bladder prolapse, female, acquired 05/16/2013  . Cough 04/19/2012  . Urinary frequency 04/27/2011  . Hypokalemia 03/10/2011  . Neutropenia ([place]) 03/10/2011  . Arthritis 01/25/2011  . Atrophic vaginitis 01/25/2011  . Preventative health care 01/25/2011  . Diverticulosis 01/25/2011  . Strain of knee   . Seasonal and perennial allergic rhinitis   . Hypertension   . Chicken pox   . Mumps   . Measles   . Goiter     Past Medical History:  Diagnosis Date  . Allergy    seasonal  . Arthritis    left hip, low back  . Atrophic vaginitis 01/25/2011  . Bladder prolapse, female, acquired 05/16/2013  . Chicken pox as a child  . Compression fracture of fourth lumbar vertebra ([place]) 04/25/2018  . Cough 04/19/2012  . 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 Goiter   . Hypertension   . Measles as a child  . Mumps as a child  . Neutropenia 03/10/2011  . Other and unspecified hyperlipidemia 08/23/2013  . Other malaise and fatigue 08/26/2013  . Pharyngitis, acute 03/09/2011  . Urinary frequency 04/27/2011  . Vaginitis 04/27/2011    Past Surgical History:  Procedure Laterality Date  . BIOPSY THYROID     FNA, benign  . BREAST BIOPSY Left    FNA, benign  . CHOLECYSTECTOMY  1998  . HERNIA REPAIR Right    right, inguinal  . in grown toenails removed     partial, b/l  . laser surgery on vericose veins     b/l  . NASAL SEPTUM SURGERY  2015  . TONSILLECTOMY  as a child  . VAGINAL HYSTERECTOMY  1988   done for polapse    Social History   Tobacco Use  . Smoking status: Former Smoker    Packs/day: 1.00    Years: 5.00    Pack years: 5.00    Types: Cigarettes    Start date: 60    Quit date: 07/12/1976    Years since quitting: 43.3  . Smokeless tobacco: Never Used  Substance Use  Topics  . Alcohol use: Yes    Comment: occasionally  . Drug use: No    Family History  Problem Relation Age of Onset  . Atrial fibrillation Mother   . Obesity Mother   . Diabetes Mother 56       Type 2  . Arthritis Mother        in right knee  . Heart disease Mother        atrial fibrillation, CHF  . Hernia Mother        never recovered from repair  . Hypertension Father   . Ulcers Father   . Pulmonary fibrosis Father   . Diabetes Maternal Grandmother        type 2?  . Heart attack Maternal Grandmother   . Hearing loss Maternal Grandmother        MI  . Parkinsonism Maternal Grandfather   . Stroke Paternal Grandmother  Allergies  Allergen Reactions  . Lisinopril Cough    Medication list has been reviewed and updated.  Current Outpatient Medications on File Prior to Visit  Medication Sig Dispense Refill  . Calcium Carbonate-Vit D-Min (CALCIUM 600+D PLUS MINERALS) 600-400 MG-UNIT TABS Take by mouth.    . Cholecalciferol (VITAMIN D3) 5000 UNITS CAPS Take 1 capsule by mouth daily.     . Coenzyme Q10 (COQ-10) 200 MG CAPS Take 1 tablet by mouth daily.     . metoprolol succinate (TOPROL-XL) 100 MG 24 hr tablet Take 1 tablet (100 mg total) by mouth daily. Take with or immediately following a meal. 90 tablet 1  . Multiple Vitamin (MULTIVITAMIN) tablet Take 1 tablet by mouth daily.      . Probiotic Product (PROBIOTIC DAILY PO) Take 1 tablet by mouth daily.     . Red Yeast Rice Extract (RED YEAST RICE PO) Take 1 capsule by mouth daily.    Marland Kitchen triamterene-hydrochlorothiazide (MAXZIDE-25) 37.5-25 MG tablet Take 1 tablet by mouth daily. 90 tablet 3  . zolpidem (AMBIEN) 10 MG tablet Take 1 tablet (10 mg total) by mouth at bedtime as needed. for sleep 15 tablet 5   No current facility-administered medications on file prior to visit.    Review of Systems:  As per HPI- otherwise negative.   Physical Examination: Vitals:   11/15/19 1100  BP: (!) 147/82  Pulse: 66  Resp: 16   Temp: (!) 97.4 F (36.3 C)  SpO2: 98%   Vitals:   11/15/19 1100  Weight: 147 lb (66.7 kg)  Height: 5\' 7"  (1.702 m)   Body mass index is 23.02 kg/m. Ideal Body Weight: Weight in (lb) to have BMI = 25: 159.3  GEN: no acute distress.  Looks well, normal weight HEENT: Atraumatic, Normocephalic.  Ears and Nose: No external deformity. CV: RRR, No M/G/R. No JVD. No thrill. No extra heart sounds. PULM: CTA B, no wheezes, crackles, rhonchi. No retractions. No resp. distress. No accessory muscle use. ABD: S, NT, ND, +BS. No rebound. No HSM.  Belly is benign EXTR: No c/c/e PSYCH: Normally interactive. Conversant.    Assessment and Plan: Lower GI bleed - Plan: CBC, Ambulatory referral to Gastroenterology  Hospital discharge follow-up  Here today for a follow-up visit from recent hospital admission.  Patient was admitted for 3 days with a GI bleed, suspect diverticular bleed.  She is currently feeling better, is eager to return to work We will check a CBC today, assuming her blood counts have not dropped precipitously she may return to work at her next scheduled shift this coming Saturday She has an appointment set up to see gastroenterology at the med center next month, placed referral as patient has focus health insurance plan She reports that she is taking a half her typical diuretic pill right now, the full pill tends to give her leg cramps.  She has an appointment to see Dr. Charlett Blake in August and they can discuss this more at that time  I encouraged her to increase her iron intake, she will let us know if any concerns in the meantime This visit occurred during the SARS-CoV-2 public health emergency.  Safety protocols were in place, including screening questions prior to the visit, additional usage of staff PPE, and extensive cleaning of exam room while observing appropriate contact time as indicated for disinfecting solutions.   Moderate medical decision making today  Signed Lamar Blinks, MD  Received her labs as below, message to patient  Results  for orders placed or performed in visit on 11/15/19  CBC  Result Value Ref Range   WBC 5.7 4.0 - 10.5 K/uL   RBC 2.95 (L) 3.87 - 5.11 Mil/uL   Platelets 297.0 150.0 - 400.0 K/uL   Hemoglobin 8.9 Repeated and verified X2. (L) 12.0 - 15.0 g/dL   HCT 25.8 Repeated and verified X2. (L) 36.0 - 46.0 %   MCV 87.5 78.0 - 100.0 fl   MCHC 34.3 30.0 - 36.0 g/dL   RDW 13.2 11.5 - 15.5 %

## 2019-11-15 ENCOUNTER — Ambulatory Visit (INDEPENDENT_AMBULATORY_CARE_PROVIDER_SITE_OTHER): Payer: No Typology Code available for payment source | Admitting: Family Medicine

## 2019-11-15 ENCOUNTER — Other Ambulatory Visit: Payer: Self-pay

## 2019-11-15 ENCOUNTER — Encounter: Payer: Self-pay | Admitting: Family Medicine

## 2019-11-15 VITALS — BP 147/82 | HR 66 | Temp 97.4°F | Resp 16 | Ht 67.0 in | Wt 147.0 lb

## 2019-11-15 DIAGNOSIS — K922 Gastrointestinal hemorrhage, unspecified: Secondary | ICD-10-CM

## 2019-11-15 DIAGNOSIS — Z09 Encounter for follow-up examination after completed treatment for conditions other than malignant neoplasm: Secondary | ICD-10-CM

## 2019-11-15 DIAGNOSIS — D62 Acute posthemorrhagic anemia: Secondary | ICD-10-CM

## 2019-11-15 LAB — CBC
HCT: 25.8 % — ABNORMAL LOW (ref 36.0–46.0)
Hemoglobin: 8.9 g/dL — ABNORMAL LOW (ref 12.0–15.0)
MCHC: 34.3 g/dL (ref 30.0–36.0)
MCV: 87.5 fl (ref 78.0–100.0)
Platelets: 297 10*3/uL (ref 150.0–400.0)
RBC: 2.95 Mil/uL — ABNORMAL LOW (ref 3.87–5.11)
RDW: 13.2 % (ref 11.5–15.5)
WBC: 5.7 10*3/uL (ref 4.0–10.5)

## 2019-11-15 MED FILL — METOPROLOL SUCCINATE ER 100: 100 | 90 days supply | Qty: 90 | Fill #1

## 2019-11-15 MED FILL — ZOLPIDEM TARTRATE 10 MG TAB: 10 | 15 days supply | Qty: 15 | Fill #1

## 2019-11-15 NOTE — Addendum Note (Signed)
Addended by: Lamar Blinks C on: 11/15/2019 12:41 PM   Modules accepted: Orders

## 2019-11-22 ENCOUNTER — Other Ambulatory Visit (INDEPENDENT_AMBULATORY_CARE_PROVIDER_SITE_OTHER): Payer: No Typology Code available for payment source

## 2019-11-22 ENCOUNTER — Other Ambulatory Visit: Payer: Self-pay

## 2019-11-22 ENCOUNTER — Encounter: Payer: Self-pay | Admitting: Family Medicine

## 2019-11-22 DIAGNOSIS — D62 Acute posthemorrhagic anemia: Secondary | ICD-10-CM | POA: Diagnosis not present

## 2019-11-22 LAB — CBC
HCT: 30 % — ABNORMAL LOW (ref 36.0–46.0)
Hemoglobin: 10 g/dL — ABNORMAL LOW (ref 12.0–15.0)
MCHC: 33.3 g/dL (ref 30.0–36.0)
MCV: 87 fl (ref 78.0–100.0)
Platelets: 293 10*3/uL (ref 150.0–400.0)
RBC: 3.44 Mil/uL — ABNORMAL LOW (ref 3.87–5.11)
RDW: 13.9 % (ref 11.5–15.5)
WBC: 5.3 10*3/uL (ref 4.0–10.5)

## 2019-11-22 LAB — FERRITIN: Ferritin: 22.5 ng/mL (ref 10.0–291.0)

## 2019-12-13 ENCOUNTER — Encounter: Payer: Self-pay | Admitting: Gastroenterology

## 2019-12-13 ENCOUNTER — Ambulatory Visit (INDEPENDENT_AMBULATORY_CARE_PROVIDER_SITE_OTHER): Payer: No Typology Code available for payment source | Admitting: Gastroenterology

## 2019-12-13 ENCOUNTER — Other Ambulatory Visit: Payer: Self-pay

## 2019-12-13 VITALS — BP 156/82 | HR 62 | Temp 97.7°F | Ht 67.0 in | Wt 153.2 lb

## 2019-12-13 DIAGNOSIS — K5730 Diverticulosis of large intestine without perforation or abscess without bleeding: Secondary | ICD-10-CM

## 2019-12-13 DIAGNOSIS — K921 Melena: Secondary | ICD-10-CM

## 2019-12-13 DIAGNOSIS — D62 Acute posthemorrhagic anemia: Secondary | ICD-10-CM | POA: Diagnosis not present

## 2019-12-13 MED ORDER — CLENPIQ 10-3.5-12 MG-GM -GM/160ML PO SOLN
1.0000 | Freq: Once | ORAL | 0 refills | Status: AC
Start: 2019-12-13 — End: 2019-12-13

## 2019-12-13 MED FILL — CLENPIQ 10-3.5-12 MG-GM -GM: 10-3.5-12 M | 1 days supply | Qty: 320 | Fill #0

## 2019-12-13 NOTE — [facility] Notes (Signed)
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  She was admitted 4/26-29 with acute onset painless hematochezia, symptomatic anemia (dizziness), and acute blood loss anemia (hemoglobin nadir 6.8; received blood transfusion).  Baseline hemoglobin 12.5.  No prior similar symptoms.  Discharged with hemoglobin 8.7.  GI consulted with plan for conservative management, PRBC transfusion, with outpatient colonoscopy in 6-8 weeks.  Was seen in follow-up by Dr. Lorelei Pont on 11/15/2019.  No recurrence of hematochezia and back to normal activity, baseline p.o. intake.  -11/15/2019: H/H 8.9/25.8 -11/22/2019: H/H 10/30; ferritin 22.5  Today, she states she feels well.  No recurrence of hematochezia.  No complaints today.  Still taking p.o. iron daily.  Tolerating all p.o. intake.  Endoscopic history: -Colonoscopy (2009, Dr. Benson Norway): Diverticulosis -Cologuard (05/2018): Negative   Past Medical History:  Diagnosis Date  . Allergy    seasonal  . Arthritis    left hip, low back  . Atrophic vaginitis 01/25/2011  . Bladder prolapse, female, acquired 05/16/2013  . Chicken pox as a child  . Compression fracture of fourth lumbar vertebra ([place]) 04/25/2018  . Cough 04/19/2012  . Diverticulosis   . 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 Gallstones   . GI bleed   . Goiter   . Hypertension   . Measles as a child  . Mumps as a child  . Neutropenia 03/10/2011  . Other and unspecified hyperlipidemia 08/23/2013  . Other malaise and fatigue 08/26/2013  . Pharyngitis, acute 03/09/2011  . Urinary frequency 04/27/2011  . Vaginitis 04/27/2011     Past Surgical History:  Procedure Laterality Date  . BIOPSY THYROID     FNA, benign  . BREAST BIOPSY Left    FNA, benign  . CHOLECYSTECTOMY   1998  . COLONOSCOPY     more than 10 years ago. Michela Pitcher it was done at [facility]  . ERCP  1999  . ESOPHAGOGASTRODUODENOSCOPY  1999   with the ERCP as well for her gallbladder  . HERNIA REPAIR Right    right, inguinal  . in grown toenails removed     partial, b/l  . laser surgery on vericose veins     b/l  . NASAL SEPTUM SURGERY  2015  . TONSILLECTOMY  as a child  . VAGINAL HYSTERECTOMY  1988   done for polapse   Family History  Problem Relation Age of Onset  . Atrial fibrillation Mother   . Obesity Mother   . Diabetes Mother 33       Type 2  . Arthritis Mother        in right knee  . Heart disease Mother        atrial fibrillation, CHF  . Hernia Mother        never recovered from repair  . Hypertension Father   . Ulcers Father   . Pulmonary fibrosis Father   . Diabetes Maternal Grandmother        type 2?  . Heart attack Maternal Grandmother   . Hearing loss Maternal Grandmother        MI  . Parkinsonism Maternal Grandfather   . Stroke Paternal Grandmother   . Colon cancer Neg Hx   . Esophageal cancer Neg Hx  Social History   Tobacco Use  . Smoking status: Former Smoker    Packs/day: 1.00    Years: 5.00    Pack years: 5.00    Types: Cigarettes    Start date: 18    Quit date: 07/12/1976    Years since quitting: 43.4  . Smokeless tobacco: Never Used  Substance Use Topics  . Alcohol use: Yes    Comment: occasionally  . Drug use: No   Current Outpatient Medications  Medication Sig Dispense Refill  . Calcium Carbonate-Vit D-Min (CALCIUM 600+D PLUS MINERALS) 600-400 MG-UNIT TABS Take by mouth.    . Cholecalciferol (VITAMIN D3) 5000 UNITS CAPS Take 1 capsule by mouth daily.     . Coenzyme Q10 (COQ-10) 200 MG CAPS Take 1 tablet by mouth daily.     . Ferrous Sulfate (SLOW FE PO) Take 1 tablet by mouth daily.    Marland Kitchen loratadine (CLARITIN) 10 MG tablet Take 10 mg by mouth daily.    . metoprolol succinate (TOPROL-XL) 100 MG 24 hr tablet Take 1 tablet (100 mg total)  by mouth daily. Take with or immediately following a meal. 90 tablet 1  . Multiple Vitamin (MULTIVITAMIN) tablet Take 1 tablet by mouth daily.      . Probiotic Product (PROBIOTIC DAILY PO) Take 1 tablet by mouth daily.     . Red Yeast Rice Extract (RED YEAST RICE PO) Take 1 capsule by mouth daily.    Marland Kitchen triamcinolone (NASACORT) 55 MCG/ACT AERO nasal inhaler Place 1 spray into the nose daily.    Marland Kitchen triamterene-hydrochlorothiazide (MAXZIDE-25) 37.5-25 MG tablet Take 1 tablet by mouth daily. 90 tablet 3  . zolpidem (AMBIEN) 10 MG tablet Take 1 tablet (10 mg total) by mouth at bedtime as needed. for sleep 15 tablet 5   No current facility-administered medications for this visit.   Allergies  Allergen Reactions  . Lisinopril Cough     Review of Systems: All systems reviewed and negative except where noted in HPI.     Physical Exam:    Wt Readings from Last 3 Encounters:  12/13/19 153 lb 4 oz (69.5 kg)  11/15/19 147 lb (66.7 kg)  08/28/19 154 lb (69.9 kg)    BP (!) 156/82   Pulse 62   Temp 97.7 F (36.5 C)   Ht 5\' 7"  (1.702 m)   Wt 153 lb 4 oz (69.5 kg)   BMI 24.00 kg/m  Constitutional:  Pleasant, in no acute distress. Psychiatric: Normal mood and affect. Behavior is normal. EENT: Pupils normal.  Conjunctivae are normal. No scleral icterus. Neck supple. No cervical LAD. Cardiovascular: Normal rate, regular rhythm. No edema Pulmonary/chest: Effort normal and breath sounds normal. No wheezing, rales or rhonchi. Abdominal: Soft, nondistended, nontender. Bowel sounds active throughout. There are no masses palpable. No hepatomegaly. Neurological: Alert and oriented to person place and time. Skin: Skin is warm and dry. No rashes noted.   ASSESSMENT AND PLAN;   1) Hematochezia 2) Diverticulosis 3) Acute blood loss anemia-improving 4) Reduced iron stores (ferritin 64)  64 year old female with recent hospitalization for acute onset painless hematochezia and acute blood loss,  symptomatic anemia, presumed secondary to self-limiting diverticular bleed.  Treated conservatively and with PRBC transfusion.  No recurrence and H/H rebounding well.  -Colonoscopy -Repeat CBC and iron panel in 3 months.  If back to baseline, can stop oral iron -Resume oral iron to help expedite replenishment of iron stores and restore hemoglobin back to baseline -Hold iron therapy 7 days prior to  colonoscopy  The indications, risks, and benefits of colonoscopy were explained to the patient in detail. Risks include but are not limited to bleeding, perforation, adverse reaction to medications, and cardiopulmonary compromise. Sequelae include but are not limited to the possibility of surgery, hospitalization, and mortality. The patient verbalized understanding and wished to proceed. All questions answered, referred to the scheduler and bowel prep ordered. Further recommendations pending results of the exam.     Lavena Bullion, DO, FACG  12/13/2019, 11:00 AM   Mosie Lukes, MD

## 2019-12-13 NOTE — Patient Instructions (Signed)
You have been scheduled for a colonoscopy. Please follow written instructions given to you at your visit today.  Please pick up your prep supplies at the pharmacy within the next 1-3 days. If you use inhalers (even only as needed), please bring them with you on the day of your procedure. Your physician has requested that you go to www.startemmi.com and enter the access code given to you at your visit today. This web site gives a general overview about your procedure. However, you should still follow specific instructions given to you by our office regarding your preparation for the procedure.  Your provider has requested that you go to the basement level for lab work at [facility] in [place]  [id number]. Press "B" on the elevator. The lab is located at the first door on the left as you exit the elevator. IN 3 MONTHS  HOLD IRON FOR 7 DAYS PRIOR TO COLON  It was a pleasure to see you today!  Vito Cirigliano, D.O.

## 2020-01-16 ENCOUNTER — Encounter: Payer: Self-pay | Admitting: Gastroenterology

## 2020-01-22 ENCOUNTER — Encounter: Payer: Self-pay | Admitting: Gastroenterology

## 2020-01-22 ENCOUNTER — Other Ambulatory Visit: Payer: Self-pay

## 2020-01-22 ENCOUNTER — Ambulatory Visit (AMBULATORY_SURGERY_CENTER): Payer: No Typology Code available for payment source | Admitting: Gastroenterology

## 2020-01-22 VITALS — BP 166/89 | HR 59 | Temp 97.5°F | Resp 11 | Ht 67.0 in | Wt 153.0 lb

## 2020-01-22 DIAGNOSIS — K5730 Diverticulosis of large intestine without perforation or abscess without bleeding: Secondary | ICD-10-CM | POA: Diagnosis not present

## 2020-01-22 DIAGNOSIS — K640 First degree hemorrhoids: Secondary | ICD-10-CM

## 2020-01-22 DIAGNOSIS — K921 Melena: Secondary | ICD-10-CM

## 2020-01-22 MED ORDER — SODIUM CHLORIDE 0.9 % IV SOLN: 500.0000 mL | Freq: Once | INTRAVENOUS | Status: DC

## 2020-01-22 NOTE — [facility] Notes (Signed)
PT taken to PACU. Monitors in place. VSS. Report given to RN. 

## 2020-01-22 NOTE — Op Note (Signed)
[facility] Patient Name: Ann Barton Procedure Date: 01/22/2020 1:50 PM MRN: [id number] Endoscopist: Gerrit Heck , MD Age: 64 Referring MD:  Date of Birth: Feb 22, 1956 Gender: Female Account #: [id number] Procedure:                Colonoscopy Indications:              Hematochezia                           64 yo female with admission in April 2021 with                            painless hematochezia and symptomatic anemia,                            requiring pRBCs. No prior similar symptoms and no                            recurrence since hospital discharge. Last                            colonoscopy was 2009 and notbale for diverticulosis. Medicines:                Monitored Anesthesia Care Procedure:                Pre-Anesthesia Assessment:                           - Prior to the procedure, a History and Physical                            was performed, and patient medications and                            allergies were reviewed. The patient's tolerance of                            previous anesthesia was also reviewed. The risks                            and benefits of the procedure and the sedation                            options and risks were discussed with the patient.                            All questions were answered, and informed consent                            was obtained. Prior Anticoagulants: The patient has                            taken no previous anticoagulant or antiplatelet  agents. ASA Grade Assessment: II - A patient with                            mild systemic disease. After reviewing the risks                            and benefits, the patient was deemed in                            satisfactory condition to undergo the procedure.                           After obtaining informed consent, the colonoscope                            was passed under direct vision. Throughout the                             procedure, the patient's blood pressure, pulse, and                            oxygen saturations were monitored continuously. The                            Colonoscope was introduced through the anus and                            advanced to the the terminal ileum. The colonoscopy                            was performed without difficulty. The patient                            tolerated the procedure well. The quality of the                            bowel preparation was good. The terminal ileum,                            ileocecal valve, appendiceal orifice, and rectum                            were photographed. Scope In: 1:58:44 PM Scope Out: 2:22:08 PM Scope Withdrawal Time: 0 hours 15 minutes 13 seconds  Total Procedure Duration: 0 hours 23 minutes 24 seconds  Findings:                 The perianal and digital rectal examinations were                            normal.                           Multiple small and large-mouthed diverticula were  found in the entire colon.                           Non-bleeding internal hemorrhoids were found during                            retroflexion. The hemorrhoids were small and Grade                            I (internal hemorrhoids that do not prolapse).                           The exam was otherwise normal throughout the                            remainder of the colon.                           The terminal ileum appeared normal. Complications:            No immediate complications. Estimated Blood Loss:     Estimated blood loss was minimal. Estimated blood                            loss: none. Impression:               - Diverticulosis in the entire examined colon.                           - Non-bleeding internal hemorrhoids.                           - The examined portion of the ileum was normal.                           - No specimens collected. Recommendation:           - Patient  has a contact number available for                            emergencies. The signs and symptoms of potential                            delayed complications were discussed with the                            patient. Return to normal activities tomorrow.                            Written discharge instructions were provided to the                            patient.                           - Resume previous diet.                           -  Continue present medications.                           - Repeat colonoscopy in 10 years for screening                            purposes.                           - Return to GI office PRN. Gerrit Heck, MD 01/22/2020 2:31:21 PM

## 2020-01-22 NOTE — Patient Instructions (Signed)
Discharge instructions given. °Handouts on Diverticulosis and Hemorrhoids. °Resume previous medications. °YOU HAD AN ENDOSCOPIC PROCEDURE TODAY AT THE [facility]:   Refer to the procedure report that was given to you for any specific questions about what was found during the examination.  If the procedure report does not answer your questions, please call your gastroenterologist to clarify.  If you requested that your care partner not be given the details of your procedure findings, then the procedure report has been included in a sealed envelope for you to review at your convenience later. ° °YOU SHOULD EXPECT: Some feelings of bloating in the abdomen. Passage of more gas than usual.  Walking can help get rid of the air that was put into your GI tract during the procedure and reduce the bloating. If you had a lower endoscopy (such as a colonoscopy or flexible sigmoidoscopy) you may notice spotting of blood in your stool or on the toilet paper. If you underwent a bowel prep for your procedure, you may not have a normal bowel movement for a few days. ° °Please Note:  You might notice some irritation and congestion in your nose or some drainage.  This is from the oxygen used during your procedure.  There is no need for concern and it should clear up in a day or so. ° °SYMPTOMS TO REPORT IMMEDIATELY: ° °Following lower endoscopy (colonoscopy or flexible sigmoidoscopy): ° Excessive amounts of blood in the stool ° Significant tenderness or worsening of abdominal pains ° Swelling of the abdomen that is new, acute ° Fever of 100°F or higher ° ° °For urgent or emergent issues, a gastroenterologist can be reached at any hour by calling [phone]. °Do not use MyChart messaging for urgent concerns.  ° ° °DIET:  We do recommend a small meal at first, but then you may proceed to your regular diet.  Drink plenty of fluids but you should avoid alcoholic beverages for 24 hours. ° °ACTIVITY:  You should plan to  take it easy for the rest of today and you should NOT DRIVE or use heavy machinery until tomorrow (because of the sedation medicines used during the test).   ° °FOLLOW UP: °Our staff will call the number listed on your records 48-72 hours following your procedure to check on you and address any questions or concerns that you may have regarding the information given to you following your procedure. If we do not reach you, we will leave a message.  We will attempt to reach you two times.  During this call, we will ask if you have developed any symptoms of COVID 19. If you develop any symptoms (ie: fever, flu-like symptoms, shortness of breath, cough etc.) before then, please call [phone].  If you test positive for Covid 19 in the 2 weeks post procedure, please call and report this information to us.   ° °If any biopsies were taken you will be contacted by phone or by letter within the next 1-3 weeks.  Please call us at [phone] if you have not heard about the biopsies in 3 weeks.  ° ° °SIGNATURES/CONFIDENTIALITY: °You and/or your care partner have signed paperwork which will be entered into your electronic medical record.  These signatures attest to the fact that that the information above on your After Visit Summary has been reviewed and is understood.  Full responsibility of the confidentiality of this discharge information lies with you and/or your care-partner.  °

## 2020-01-24 ENCOUNTER — Telehealth: Payer: Self-pay

## 2020-01-24 NOTE — Telephone Encounter (Signed)
NO ANSWER, MESSAGE LEFT FOR PATIENT. 

## 2020-01-24 NOTE — Telephone Encounter (Signed)
  Follow up Call-  Call back number 01/22/2020  Post procedure Call Back phone  # [phone]  Permission to leave phone message Yes  Some recent data might be hidden     Patient questions:  Do you have a fever, pain , or abdominal swelling? No. Pain Score  0 *  Have you tolerated food without any problems? Yes.    Have you been able to return to your normal activities? Yes.    Do you have any questions about your discharge instructions: Diet   No. Medications  No. Follow up visit  No.  Do you have questions or concerns about your Care? No.  Actions: * If pain score is 4 or above: No action needed, pain <4.  1. Have you developed a fever since your procedure? no  2.   Have you had an respiratory symptoms (SOB or cough) since your procedure? no  3.   Have you tested positive for COVID 19 since your procedure no  4.   Have you had any family members/close contacts diagnosed with the COVID 19 since your procedure?  no   If yes to any of these questions please route to Joylene John, RN and Erenest Rasher, RN

## 2020-02-14 ENCOUNTER — Other Ambulatory Visit (INDEPENDENT_AMBULATORY_CARE_PROVIDER_SITE_OTHER): Payer: No Typology Code available for payment source

## 2020-02-14 DIAGNOSIS — K921 Melena: Secondary | ICD-10-CM | POA: Diagnosis not present

## 2020-02-14 DIAGNOSIS — K5730 Diverticulosis of large intestine without perforation or abscess without bleeding: Secondary | ICD-10-CM | POA: Diagnosis not present

## 2020-02-14 LAB — CBC WITH DIFFERENTIAL/PLATELET
Basophils Absolute: 0 10*3/uL (ref 0.0–0.1)
Basophils Relative: 0.7 % (ref 0.0–3.0)
Eosinophils Absolute: 0.2 10*3/uL (ref 0.0–0.7)
Eosinophils Relative: 3.5 % (ref 0.0–5.0)
HCT: 39.9 % (ref 36.0–46.0)
Hemoglobin: 13.2 g/dL (ref 12.0–15.0)
Lymphocytes Relative: 22.7 % (ref 12.0–46.0)
Lymphs Abs: 1.3 10*3/uL (ref 0.7–4.0)
MCHC: 33.2 g/dL (ref 30.0–36.0)
MCV: 85.1 fl (ref 78.0–100.0)
Monocytes Absolute: 0.4 10*3/uL (ref 0.1–1.0)
Monocytes Relative: 6.5 % (ref 3.0–12.0)
Neutro Abs: 4 10*3/uL (ref 1.4–7.7)
Neutrophils Relative %: 66.6 % (ref 43.0–77.0)
Platelets: 223 10*3/uL (ref 150.0–400.0)
RBC: 4.69 Mil/uL (ref 3.87–5.11)
RDW: 14.7 % (ref 11.5–15.5)
WBC: 5.9 10*3/uL (ref 4.0–10.5)

## 2020-02-14 LAB — IBC + FERRITIN
Ferritin: 9.9 ng/mL — ABNORMAL LOW (ref 10.0–291.0)
Iron: 66 ug/dL (ref 42–145)
Saturation Ratios: 18.3 % — ABNORMAL LOW (ref 20.0–50.0)
Transferrin: 257 mg/dL (ref 212.0–360.0)

## 2020-02-15 ENCOUNTER — Other Ambulatory Visit: Payer: Self-pay | Admitting: Gastroenterology

## 2020-02-15 ENCOUNTER — Telehealth: Payer: Self-pay | Admitting: Family

## 2020-02-15 ENCOUNTER — Encounter: Payer: Self-pay | Admitting: Family Medicine

## 2020-02-15 DIAGNOSIS — D509 Iron deficiency anemia, unspecified: Secondary | ICD-10-CM

## 2020-02-15 DIAGNOSIS — K921 Melena: Secondary | ICD-10-CM

## 2020-02-15 NOTE — Telephone Encounter (Signed)
I called and spoke with patient regarding New Patient appt. I have mailed a New Patient Packet to her as well.  She will need a referral from her PCP due to [place]

## 2020-02-28 ENCOUNTER — Ambulatory Visit: Payer: No Typology Code available for payment source | Admitting: Family Medicine

## 2020-03-05 ENCOUNTER — Other Ambulatory Visit: Payer: Self-pay | Admitting: Family Medicine

## 2020-03-05 MED FILL — METOPROLOL SUCCINATE ER 100: 100 | 90 days supply | Qty: 90 | Fill #0

## 2020-03-11 ENCOUNTER — Other Ambulatory Visit: Payer: Self-pay | Admitting: Family

## 2020-03-11 ENCOUNTER — Telehealth: Payer: Self-pay | Admitting: Family

## 2020-03-11 ENCOUNTER — Ambulatory Visit: Payer: No Typology Code available for payment source

## 2020-03-11 ENCOUNTER — Inpatient Hospital Stay: Payer: No Typology Code available for payment source | Attending: Family | Admitting: Family

## 2020-03-11 ENCOUNTER — Encounter: Payer: Self-pay | Admitting: *Deleted

## 2020-03-11 ENCOUNTER — Inpatient Hospital Stay: Payer: No Typology Code available for payment source

## 2020-03-11 ENCOUNTER — Encounter: Payer: Self-pay | Admitting: Family

## 2020-03-11 VITALS — BP 177/98 | HR 67 | Resp 18 | Ht 67.0 in | Wt 149.0 lb

## 2020-03-11 DIAGNOSIS — D500 Iron deficiency anemia secondary to blood loss (chronic): Secondary | ICD-10-CM

## 2020-03-11 DIAGNOSIS — E785 Hyperlipidemia, unspecified: Secondary | ICD-10-CM | POA: Diagnosis not present

## 2020-03-11 DIAGNOSIS — D649 Anemia, unspecified: Secondary | ICD-10-CM

## 2020-03-11 DIAGNOSIS — Z87891 Personal history of nicotine dependence: Secondary | ICD-10-CM | POA: Diagnosis not present

## 2020-03-11 DIAGNOSIS — Z79899 Other long term (current) drug therapy: Secondary | ICD-10-CM | POA: Insufficient documentation

## 2020-03-11 DIAGNOSIS — K921 Melena: Secondary | ICD-10-CM | POA: Insufficient documentation

## 2020-03-11 DIAGNOSIS — I10 Essential (primary) hypertension: Secondary | ICD-10-CM | POA: Insufficient documentation

## 2020-03-11 DIAGNOSIS — M1990 Unspecified osteoarthritis, unspecified site: Secondary | ICD-10-CM | POA: Diagnosis not present

## 2020-03-11 DIAGNOSIS — D509 Iron deficiency anemia, unspecified: Secondary | ICD-10-CM | POA: Insufficient documentation

## 2020-03-11 DIAGNOSIS — R5383 Other fatigue: Secondary | ICD-10-CM | POA: Diagnosis not present

## 2020-03-11 DIAGNOSIS — R42 Dizziness and giddiness: Secondary | ICD-10-CM | POA: Diagnosis not present

## 2020-03-11 LAB — CBC WITH DIFFERENTIAL (CANCER CENTER ONLY)
Abs Immature Granulocytes: 0.05 10*3/uL (ref 0.00–0.07)
Basophils Absolute: 0 10*3/uL (ref 0.0–0.1)
Basophils Relative: 1 %
Eosinophils Absolute: 0.2 10*3/uL (ref 0.0–0.5)
Eosinophils Relative: 4 %
HCT: 39 % (ref 36.0–46.0)
Hemoglobin: 12.8 g/dL (ref 12.0–15.0)
Immature Granulocytes: 1 %
Lymphocytes Relative: 21 %
Lymphs Abs: 0.9 10*3/uL (ref 0.7–4.0)
MCH: 28.3 pg (ref 26.0–34.0)
MCHC: 32.8 g/dL (ref 30.0–36.0)
MCV: 86.3 fL (ref 80.0–100.0)
Monocytes Absolute: 0.3 10*3/uL (ref 0.1–1.0)
Monocytes Relative: 7 %
Neutro Abs: 2.9 10*3/uL (ref 1.7–7.7)
Neutrophils Relative %: 66 %
Platelet Count: 229 10*3/uL (ref 150–400)
RBC: 4.52 MIL/uL (ref 3.87–5.11)
RDW: 13.3 % (ref 11.5–15.5)
WBC Count: 4.4 10*3/uL (ref 4.0–10.5)
nRBC: 0 % (ref 0.0–0.2)

## 2020-03-11 LAB — CMP (CANCER CENTER ONLY)
ALT: 10 U/L (ref 0–44)
AST: 13 U/L — ABNORMAL LOW (ref 15–41)
Albumin: 4.4 g/dL (ref 3.5–5.0)
Alkaline Phosphatase: 50 U/L (ref 38–126)
Anion gap: 5 (ref 5–15)
BUN: 12 mg/dL (ref 8–23)
CO2: 33 mmol/L — ABNORMAL HIGH (ref 22–32)
Calcium: 10 mg/dL (ref 8.9–10.3)
Chloride: 106 mmol/L (ref 98–111)
Creatinine: 0.8 mg/dL (ref 0.44–1.00)
GFR, Est AFR Am: >60 mL/min (ref >60)
GFR, Estimated: >60 mL/min (ref >60)
Glucose, Bld: 85 mg/dL (ref 70–99)
Potassium: 4.2 mmol/L (ref 3.5–5.1)
Sodium: 144 mmol/L (ref 135–145)
Total Bilirubin: 0.4 mg/dL (ref 0.3–1.2)
Total Protein: 7 g/dL (ref 6.5–8.1)

## 2020-03-11 LAB — RETICULOCYTES
Immature Retic Fract: 5.5 % (ref 2.3–15.9)
RBC.: 4.51 MIL/uL (ref 3.87–5.11)
Retic Count, Absolute: 56.4 10*3/uL (ref 19.0–186.0)
Retic Ct Pct: 1.3 % (ref 0.4–3.1)

## 2020-03-11 LAB — IRON AND TIBC
Iron: 156 ug/dL — ABNORMAL HIGH (ref 41–142)
Saturation Ratios: 59 % — ABNORMAL HIGH (ref 21–57)
TIBC: 264 ug/dL (ref 236–444)
UIBC: 108 ug/dL — ABNORMAL LOW (ref 120–384)

## 2020-03-11 LAB — LACTATE DEHYDROGENASE: LDH: 182 U/L (ref 98–192)

## 2020-03-11 LAB — FERRITIN: Ferritin: 17 ng/mL (ref 11–307)

## 2020-03-11 LAB — SAVE SMEAR(SSMR), FOR PROVIDER SLIDE REVIEW

## 2020-03-11 NOTE — [facility] Notes (Signed)
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 Morro is a very pleasant 64 yo caucasian female with recent history of anemia secondary to GI blood loss with diverticulitis.  She was hospitalized in April with a lower GI bleed and received 1 unit of blood for initial Hgb of 6.8. She had a colonoscopy during admission which showed diverticulosis throughout her colon as well as non bleeding internal hemorrhoids.  She continues to follow up with Dr. Bryan Lemma.  She is feeling much better and has not noted any more blood loss. No bruising or petechiae.  Hgb is now 12.8, MCV 86 and reticulocytes 1.3%. Iron studies are pending. Earlier this month her iron saturation was 18% and ferritin 9.  She is currently taking an oral iron supplement daily.  She still has some fatigue.  No family history of anemia that she knows of.  No personal or familial history of cancer.  No diabetes or history of thyroid disease.  No fever, chills, n/v, cough, rash, SOB, chest pain, palpitations, abdominal pain or changes in bowel or bladder habits.  No swelling, tenderness, numbness or tingling in her extremities at this time.   She has occasional episodes of dizziness if she bends over and then stands up to quickly. She is careful to avoid this. No falls or syncopal episodes to report.  No smoking, ETOH or recreational drug use.  She has maintained a good appetite and is staying well hydrated. Her weight is described as stable.  She walks regularly for exercise.  She works 2 nights a week which can disrupt her sleep schedule. She is a Press photographer.   ROS: All other 10 point review of systems is negative.   PAST MEDICAL  HISTORY:   Past Medical History:  Diagnosis Date  . Allergy    seasonal  . Arthritis    left hip, low back  . Atrophic vaginitis 01/25/2011  . Bladder prolapse, female, acquired 05/16/2013  . Blood transfusion without reported diagnosis   . Chicken pox as a child  . Compression fracture of fourth lumbar vertebra ([place]) 04/25/2018  . Cough 04/19/2012  . Diverticulosis   . 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 Gallstones   . GI bleed   . Goiter   . Hypertension   . Measles as a child  . Mumps as a child  . Neutropenia 03/10/2011  . Osteoporosis   . Other and unspecified hyperlipidemia 08/23/2013  . Other malaise and fatigue 08/26/2013  . Pharyngitis, acute 03/09/2011  . Urinary frequency 04/27/2011  . Vaginitis 04/27/2011    ALLERGIES: Allergies  Allergen Reactions  . Lisinopril Cough      MEDICATIONS:  Current Outpatient Medications on File Prior to Visit  Medication Sig Dispense Refill  . Calcium Carbonate-Vit D-Min (CALCIUM 600+D PLUS MINERALS) 600-400 MG-UNIT TABS Take by mouth.    . Cholecalciferol (VITAMIN D3) 5000 UNITS CAPS Take 1 capsule by mouth daily.     . Coenzyme Q10 (COQ-10) 200 MG CAPS Take 1 tablet by mouth daily.     . Ferrous Sulfate (SLOW FE PO) Take 1 tablet by mouth daily.    Marland Kitchen loratadine (CLARITIN) 10 MG tablet Take 10 mg by  mouth daily.    . metoprolol succinate (TOPROL-XL) 100 MG 24 hr tablet TAKE 1 TABLET (100 MG TOTAL) BY MOUTH DAILY. TAKE WITH OR IMMEDIATELY FOLLOWING A MEAL. 90 tablet 1  . Multiple Vitamin (MULTIVITAMIN) tablet Take 1 tablet by mouth daily.      . Probiotic Product (PROBIOTIC DAILY PO) Take 1 tablet by mouth daily.     . Red Yeast Rice Extract (RED YEAST RICE PO) Take 1 capsule by mouth daily.    Marland Kitchen triamcinolone (NASACORT) 55 MCG/ACT AERO nasal inhaler Place 1 spray into the nose daily.    Marland Kitchen triamterene-hydrochlorothiazide (MAXZIDE-25) 37.5-25 MG tablet Take 1 tablet by mouth daily. 90 tablet 3  . zolpidem (AMBIEN) 10 MG tablet  Take 1 tablet (10 mg total) by mouth at bedtime as needed. for sleep 15 tablet 5   No current facility-administered medications on file prior to visit.     PAST SURGICAL HISTORY Past Surgical History:  Procedure Laterality Date  . BIOPSY THYROID     FNA, benign  . BREAST BIOPSY Left    FNA, benign  . CHOLECYSTECTOMY  1998  . COLONOSCOPY     more than 10 years ago. Michela Pitcher it was done at [facility]  . ERCP  1999  . ESOPHAGOGASTRODUODENOSCOPY  1999   with the ERCP as well for her gallbladder  . HERNIA REPAIR Right    right, inguinal  . in grown toenails removed     partial, b/l  . laser surgery on vericose veins     b/l  . NASAL SEPTUM SURGERY  2015  . TONSILLECTOMY  as a child  . VAGINAL HYSTERECTOMY  1988   done for polapse    FAMILY HISTORY: Family History  Problem Relation Age of Onset  . Atrial fibrillation Mother   . Obesity Mother   . Diabetes Mother 74       Type 2  . Arthritis Mother        in right knee  . Heart disease Mother        atrial fibrillation, CHF  . Hernia Mother        never recovered from repair  . Hypertension Father   . Ulcers Father   . Pulmonary fibrosis Father   . Diabetes Maternal Grandmother        type 2?  . Heart attack Maternal Grandmother   . Hearing loss Maternal Grandmother        MI  . Parkinsonism Maternal Grandfather   . Stroke Paternal Grandmother   . Colon cancer Maternal Aunt   . Esophageal cancer Neg Hx     SOCIAL HISTORY:  reports that she quit smoking about 43 years ago. Her smoking use included cigarettes. She started smoking about 48 years ago. She has a 5.00 pack-year smoking history. She has never used smokeless tobacco. She reports current alcohol use. She reports that she does not use drugs.  PERFORMANCE STATUS: The patient's performance status is 1 - Symptomatic but completely ambulatory  PHYSICAL EXAM: Most Recent Vital Signs: Blood pressure (!) 177/98, pulse 67, resp. rate 18, height 5\' 7"  (1.702 m),  weight 149 lb (67.6 kg), SpO2 98 %. BP (!) 177/98 (BP Location: Left Arm, Patient Position: Sitting, Cuff Size: Normal)   Pulse 67   Resp 18   Ht 5\' 7"  (1.702 m)   Wt 149 lb (67.6 kg)   SpO2 98%   BMI 23.34 kg/m   General Appearance:    Alert, cooperative, no distress,  appears stated age  Head:    Normocephalic, without obvious abnormality, atraumatic  Eyes:    PERRL, conjunctiva/corneas clear, EOM's intact, fundi    benign, both eyes        Throat:   Lips, mucosa, and tongue normal; teeth and gums normal  Neck:   Supple, symmetrical, trachea midline, no adenopathy;    thyroid:  no enlargement/tenderness/nodules; no carotid   bruit or JVD  Back:     Symmetric, no curvature, ROM normal, no CVA tenderness  Lungs:     Clear to auscultation bilaterally, respirations unlabored  Chest Wall:    No tenderness or deformity   Heart:    Regular rate and rhythm, S1 and S2 normal, no murmur, rub   or gallop     Abdomen:     Soft, non-tender, bowel sounds active all four quadrants,    no masses, no organomegaly        Extremities:   Extremities normal, atraumatic, no cyanosis or edema  Pulses:   2+ and symmetric all extremities  Skin:   Skin color, texture, turgor normal, no rashes or lesions  Lymph nodes:   Cervical, supraclavicular, and axillary nodes normal  Neurologic:   CNII-XII intact, normal strength, sensation and reflexes    throughout    LABORATORY DATA:  No results found for this or any previous visit (from the past 48 hour(s)).    RADIOGRAPHY: No results found.     PATHOLOGY: None  ASSESSMENT/PLAN: Ms. Veldman is a very pleasant 64 yo caucasian female with recent history of anemia secondary to GI blood loss with diverticulitis.  We will see what her iron studies look like and determine if she needs to make the switch to IV iron or can continue her daily oral supplement.  We will go ahead and plan to see her again in another 8 weeks.   All questions were answered and she is  in agreement with the plan. She can contact our office with any questions or concerns. We can certainly see her sooner if needed.   Laverna Peace, NP

## 2020-03-11 NOTE — Telephone Encounter (Signed)
Appointments scheduled and patient will get update from My Chart  8/31 los

## 2020-03-12 LAB — ERYTHROPOIETIN: Erythropoietin: 14 m[IU]/mL (ref 2.6–18.5)

## 2020-03-18 ENCOUNTER — Other Ambulatory Visit: Payer: Self-pay | Admitting: Family

## 2020-04-30 ENCOUNTER — Ambulatory Visit: Payer: No Typology Code available for payment source | Admitting: Obstetrics and Gynecology

## 2020-04-30 ENCOUNTER — Telehealth: Payer: Self-pay

## 2020-04-30 ENCOUNTER — Other Ambulatory Visit: Payer: Self-pay

## 2020-04-30 ENCOUNTER — Encounter: Payer: Self-pay | Admitting: Obstetrics and Gynecology

## 2020-04-30 VITALS — BP 160/80 | HR 76 | Ht 66.75 in | Wt 178.0 lb

## 2020-04-30 DIAGNOSIS — T8389XA Other specified complication of genitourinary prosthetic devices, implants and grafts, initial encounter: Secondary | ICD-10-CM | POA: Diagnosis not present

## 2020-04-30 DIAGNOSIS — R159 Full incontinence of feces: Secondary | ICD-10-CM | POA: Diagnosis not present

## 2020-04-30 DIAGNOSIS — N8111 Cystocele, midline: Secondary | ICD-10-CM | POA: Diagnosis not present

## 2020-04-30 DIAGNOSIS — N952 Postmenopausal atrophic vaginitis: Secondary | ICD-10-CM | POA: Diagnosis not present

## 2020-04-30 DIAGNOSIS — N898 Other specified noninflammatory disorders of vagina: Secondary | ICD-10-CM

## 2020-04-30 NOTE — Telephone Encounter (Signed)
Spoke with pt. Pt needing to schedule 2-3 week follow up per Dr Talbert Nan after being seen today.  Pt scheduled for 11/9 at 1130 am. Pt agreeable and verbalized understanding to date and time of appt.  Encounter closed

## 2020-04-30 NOTE — [facility] Notes (Signed)
64 y.o. [id number] Widowed White or Caucasian Not Hispanic or Latino female here for a pessary fitting. She has in her current pessary in place.  She gets some blood tinged discharge, see's that most days. That has been going on for about a year. She takes the pessary out and cleans it a couple of times a week in the shower. She isn't bulging in front of the pessary, but it does shift and she sometimes needs to push it back up. She has a BM most days, sometimes more than one time a day. Every couple of weeks she can have loose stool and can occasionally leak a small amount of stool. That has been going on for a while, she wears a pad. She has foods give her gas, not diarrhea.  No urinary leakage.  She is not sexually active. H/O hysterectomy for prolapse     No LMP recorded. Patient has had a hysterectomy.          Sexually active: No.  Smoker:  no  Health Maintenance: Pap:  04/27/11 vaginal pap WNL  History of abnormal Pap:  no MMG:  05/26/18 Bi-rads 1 neg  BMD:  05/25/18  Colonoscopy: 01/22/20 WNL repeat 10 years  TDaP:  2014 Gardasil: none    reports that she quit smoking about 43 years ago. Her smoking use included cigarettes. She started smoking about 48 years ago. She has a 5.00 pack-year smoking history. She has never used smokeless tobacco. She reports current alcohol use. She reports that she does not use drugs.  Past Medical History:  Diagnosis Date   Allergy    seasonal   Arthritis    left hip, low back   Atrophic vaginitis 01/25/2011   Bladder prolapse, female, acquired 05/16/2013   Blood transfusion without reported diagnosis    Chicken pox as a child   Compression fracture of fourth lumbar vertebra ([place]) 04/25/2018   Cough 04/19/2012   Diverticulosis    Female sexual dysfunction 01/25/2011   Gallstones    GI bleed    Goiter    Hypertension    Measles as a child   Mumps as a child   Neutropenia 03/10/2011   Osteoporosis    Other and unspecified  hyperlipidemia 08/23/2013   Other malaise and fatigue 08/26/2013   Pharyngitis, acute 03/09/2011   Urinary frequency 04/27/2011   Vaginitis 04/27/2011    Past Surgical History:  Procedure Laterality Date   BIOPSY THYROID     FNA, benign   BREAST BIOPSY Left    FNA, benign   CHOLECYSTECTOMY  1998   COLONOSCOPY     more than 10 years ago. Michela Pitcher it was done at [facility]   ERCP  [place]   with the ERCP as well for her gallbladder   HERNIA REPAIR Right    right, inguinal   in grown toenails removed     partial, b/l   laser surgery on vericose veins     b/l   NASAL SEPTUM SURGERY  2015   TONSILLECTOMY  as a child   [place]   done for polapse    Current Outpatient Medications  Medication Sig Dispense Refill   Calcium Carbonate-Vit D-Min (CALCIUM 600+D PLUS MINERALS) 600-400 MG-UNIT TABS Take by mouth.     Cholecalciferol (VITAMIN D3) 5000 UNITS CAPS Take 1 capsule by mouth daily.      Coenzyme Q10 (COQ-10) 200 MG CAPS Take 1 tablet by mouth daily.  Ferrous Sulfate (SLOW FE PO) Take 1 tablet by mouth daily.     loratadine (CLARITIN) 10 MG tablet Take 10 mg by mouth daily.     metoprolol succinate (TOPROL-XL) 100 MG 24 hr tablet TAKE 1 TABLET (100 MG TOTAL) BY MOUTH DAILY. TAKE WITH OR IMMEDIATELY FOLLOWING A MEAL. 90 tablet 1   Multiple Vitamin (MULTIVITAMIN) tablet Take 1 tablet by mouth daily.       Probiotic Product (PROBIOTIC DAILY PO) Take 1 tablet by mouth daily.      Red Yeast Rice Extract (RED YEAST RICE PO) Take 1 capsule by mouth daily.     triamcinolone (NASACORT) 55 MCG/ACT AERO nasal inhaler Place 1 spray into the nose daily.     triamterene-hydrochlorothiazide (MAXZIDE-25) 37.5-25 MG tablet Take 1 tablet by mouth daily. 90 tablet 3   zolpidem (AMBIEN) 10 MG tablet Take 1 tablet (10 mg total) by mouth at bedtime as needed. for sleep 15 tablet 5   No current facility-administered  medications for this visit.    Family History  Problem Relation Age of Onset   Atrial fibrillation Mother    Obesity Mother    Diabetes Mother 13       Type 2   Arthritis Mother        in right knee   Heart disease Mother        atrial fibrillation, CHF   Hernia Mother        never recovered from repair   Hypertension Father    Ulcers Father    Pulmonary fibrosis Father    Diabetes Maternal Grandmother        type 2?   Heart attack Maternal Grandmother    Hearing loss Maternal Grandmother        MI   Parkinsonism Maternal Grandfather    Stroke Paternal Grandmother    Colon cancer Maternal Aunt    Esophageal cancer Neg Hx     Review of Systems  All other systems reviewed and are negative.   Exam:   BP (!) 160/80    Pulse 76    Ht 5' 6.75" (1.695 m)    Wt 178 lb (80.7 kg)    SpO2 100%    BMI 28.09 kg/m   Weight change: @WEIGHTCHANGE @ Height:   Height: 5' 6.75" (169.5 cm)  Ht Readings from Last 3 Encounters:  04/30/20 5' 6.75" (1.695 m)  03/11/20 5\' 7"  (1.702 m)  01/22/20 5\' 7"  (1.702 m)    General appearance: alert, cooperative and appears stated age   Pelvic: External genitalia:  no lesions              Urethra:  normal appearing urethra with no masses, tenderness or lesions              Bartholins and Skenes: normal                 Vagina:the pessary was noted to move with cough. The pessary was removed and cleaned. The vaginal apex is ulcerated, ~3-4 x 0.5 cm area, slight bleeding. Grade 1 cystocele with valsalva (only examined supine)              Cervix: absent               Bimanual Exam:  Uterus:  uterus absent              Adnexa: no mass, fullness, tenderness  Rectovaginal: Confirms               Anus:  normal sphincter tone, no lesions Rectovaginal kegel strength 3/5  Shanon Petty chaperoned for the exam.  A:  Cystocele. Mild on exam today, but I suspect it will be worse when she is on her feet with the pessary out.    Vaginal irritation  Vaginal atrophy  Fecal incontinence, small amounts every few weeks. Decent kegel strength  P:   Pessary left out  Discussed the option of vaginal estrogen, she declines at this time  Return in 2-3 weeks, if healed will do a pessary fitting, suspect she will need a larger pessary  Discussed metamucil for fecal incontinence. She knows how to kegel. Could send to PT if it becomes more problematic for her.

## 2020-04-30 NOTE — Telephone Encounter (Signed)
New message   The patient was seen today advise to follow up in 2-3 weeks.   Please assist with scheduling appointment

## 2020-04-30 NOTE — Patient Instructions (Addendum)
Fecal Incontinence Fecal incontinence, also called accidental bowel leakage, is not being able to control your bowels. This condition happens because the nerves or muscles around the anus do not work the way they should. This affects their ability to hold stool (feces). What are the causes? This condition may be caused by:  Damage to the muscles at the end of the rectum (sphincter).  Damage to the nerves that control bowel movements.  Diarrhea.  Chronic constipation.  Pelvic floor dysfunction. This means the muscles in the pelvis do not work well.  Loss of bowel storage capacity. This occurs when the rectum can no longer stretch in size in order to store feces.  Inflammatory bowel disease (IBD), such as Crohn's disease.  Irritable bowel syndrome (IBS). What increases the risk? You are more likely to develop this condition if you:  Were born with bowels or a pelvis that did not form correctly.  Have had rectal surgery.  Have had radiation treatment for certain cancers.  Have been pregnant, had a vaginal delivery, or had surgery that damaged the pelvic floor muscles.  Had a complicated childbirth, spinal cord injury, or other trauma that caused nerve damage.  Have a condition that can affect nerve function, such as diabetes, Parkinson's disease, or multiple sclerosis.  Have a condition where the rectum drops down into the anus or vagina (prolapse).  Are 27 years of age or older. What are the signs or symptoms? The main symptom of this condition is not being able to control your bowels. You also might not be able to get to the bathroom before a bowel movement. How is this diagnosed? This condition is diagnosed with a medical history and physical exam. You may also have other tests, including:  Blood tests.  Urine tests.  A rectal exam.  Ultrasound.  MRI.  Colonoscopy. This is an exam that looks at your large intestine (colon).  Anal manometry. This is a test that  measures the strength of the anal sphincter.  Anal electromyogram (EMG). This is a test that uses small electrodes to check for nerve damage. How is this treated? Treatment for this condition depends on the cause and severity. Treatment may also focus on addressing any underlying causes of this condition. Treatment may include:  Medicines. This may include medicines to: ? Prevent diarrhea. ? Help with constipation (bulk-forming laxatives). ? Treat any underlying conditions.  Biofeedback therapy. This can help to retrain muscles that are affected.  Fiber supplements. These can help manage your bowel movements.  Nerve stimulation.  Injectable gel to promote tissue growth and better muscle control.  Surgery. You may need: ? Sphincter repair surgery. ? Diversion surgery. This procedure lets feces pass out of your body through a hole in your abdomen. Follow these instructions at home: Eating and drinking   Follow instructions from your health care provider about any eating or drinking restrictions. ? Work with a dietitian to come up with a healthy diet that will help you avoid the foods that can make your condition worse. ? Keep a diet diary to find out which foods or drinks could be making your condition worse.  Drink enough fluid to keep your urine pale yellow. Lifestyle  Do not use any products that contain nicotine or tobacco, such as cigarettes and e-cigarettes. If you need help quitting, ask your health care provider. This may help your condition.  If you are overweight, talk with your health care provider about how to safely lose weight. This may help  your condition.  Increase your physical activity as told by your health care provider. This may help your condition. Always talk with your health care provider before starting a new exercise program.  Carry a change of clothes and supplies to clean up quickly if you have an episode of fetal incontinence.  Consider joining a  fecal incontinence support group. You can find a support group online or in your local community. General instructions   Take over-the-counter and prescription medicines only as told by your health care provider. This includes any supplements.  Apply a moisture barrier, such as petroleum jelly, to your rectum. This protects the skin from irritation caused by ongoing leaking or diarrhea.  Tell your health care provider if you are upset or depressed about your condition.  Keep all follow-up visits as told by your health care provider. This is important. Where to find more information  International Foundation for Functional Gastrointestinal Disorders: iffgd.[employer] Gastro[employer]: patients.gi.org Contact a health care provider if:  You have a fever.  You have redness, swelling, or pain around your rectum.  Your pain is getting worse or you lose feeling in your rectal area.  You have blood in your stool.  You feel sad or hopeless.  You avoid social or work situations. Get help right away if:  You stop having bowel movements.  You cannot eat or drink without vomiting.  You have rectal bleeding that does not stop.  You have severe pain that is getting worse.  You have symptoms of dehydration, including: ? Sleepiness or fatigue. ? Producing little or no urine, tears, or sweat. ? Dizziness. ? Dry mouth. ? Unusual irritability. ? Headache. ? Inability to think clearly. Summary  Fecal incontinence, also called accidental bowel leakage, is not being able to control your bowels. This condition happens because the nerves or muscles around the anus do not work the way they should.  Treatment varies depending on the cause and severity of your condition. Treatment may also focus on addressing any underlying causes of this condition.  Follow instructions from your health care provider about any eating or drinking restrictions, lifestyle changes, and skin  care.  Take over-the-counter and prescription medicines only as told by your health care provider. This includes any supplements.  Tell your health care provider if your symptoms worsen or if you are upset or depressed about your condition. This information is not intended to replace advice given to you by your health care provider. Make sure you discuss any questions you have with your health care provider. Document Revised: 11/10/2017 Document Reviewed: 11/10/2017 Elsevier Patient Education  2020 [place]. Atrophic Vaginitis  Atrophic vaginitis is a condition in which the tissues that line the vagina become dry and thin. This condition is most common in women who have stopped having regular menstrual periods (are in menopause). This usually starts when a woman is 81-34 years old. That is the time when a woman's estrogen levels begin to drop (decrease). Estrogen is a female hormone. It helps to keep the tissues of the vagina moist. It stimulates the vagina to produce a clear fluid that lubricates the vagina for sexual intercourse. This fluid also protects the vagina from infection. Lack of estrogen can cause the lining of the vagina to get thinner and dryer. The vagina may also shrink in size. It may become less elastic. Atrophic vaginitis tends to get worse over time as a woman's estrogen level drops. What are the causes? This condition is  caused by the normal drop in estrogen that happens around the time of menopause. What increases the risk? Certain conditions or situations may lower a woman's estrogen level, leading to a higher risk for atrophic vaginitis. You are more likely to develop this condition if:  You are taking medicines that block estrogen.  You have had your ovaries removed.  You are being treated for cancer with X-ray (radiation) or medicines (chemotherapy).  You have given birth or are breastfeeding.  You are older than age 49.  You smoke. What are the signs or  symptoms? Symptoms of this condition include:  Pain, soreness, or bleeding during sexual intercourse (dyspareunia).  Vaginal burning, irritation, or itching.  Pain or bleeding when a speculum is used in a vaginal exam (pelvic exam).  Having burning pain when passing urine.  Vaginal discharge that is brown or yellow. In some cases, there are no symptoms. How is this diagnosed? This condition is diagnosed by taking a medical history and doing a physical exam. This will include a pelvic exam that checks the vaginal tissues. Though rare, you may also have other tests, including:  A urine test.  A test that checks the acid balance in your vagina (acid balance test). How is this treated? Treatment for this condition depends on how severe your symptoms are. Treatment may include:  Using an over-the-counter vaginal lubricant before sex.  Using a long-acting vaginal moisturizer.  Using low-dose vaginal estrogen for moderate to severe symptoms that do not respond to other treatments. Options include creams, tablets, and inserts (vaginal rings). Before you use a vaginal estrogen, tell your health care provider if you have a history of: ? Breast cancer. ? Endometrial cancer. ? Blood clots. If you are not sexually active and your symptoms are very mild, you may not need treatment. Follow these instructions at home: Medicines  Take over-the-counter and prescription medicines only as told by your health care provider. Do not use herbal or alternative medicines unless your health care provider says that you can.  Use over-the-counter creams, lubricants, or moisturizers for dryness only as directed by your health care provider. General instructions  If your atrophic vaginitis is caused by menopause, discuss all of your menopause symptoms and treatment options with your health care provider.  Do not douche.  Do not use products that can make your vagina dry. These include: ? Scented  feminine sprays. ? Scented tampons. ? Scented soaps.  Vaginal intercourse can help to improve blood flow and elasticity of vaginal tissue. If it hurts to have sex, try using a lubricant or moisturizer just before having intercourse. Contact a health care provider if:  Your discharge looks different than normal.  Your vagina has an unusual smell.  You have new symptoms.  Your symptoms do not improve with treatment.  Your symptoms get worse. Summary  Atrophic vaginitis is a condition in which the tissues that line the vagina become dry and thin. It is most common in women who have stopped having regular menstrual periods (are in menopause).  Treatment options include using vaginal lubricants and low-dose vaginal estrogen.  Contact a health care provider if your vagina has an unusual smell, or if your symptoms get worse or do not improve after treatment. This information is not intended to replace advice given to you by your health care provider. Make sure you discuss any questions you have with your health care provider. Document Revised: 06/10/2017 Document Reviewed: 03/24/2017 Elsevier Patient Education  [place]  Overview  The pelvic organs, including the bladder, are normally supported by pelvic floor muscles and ligaments.  When these muscles and ligaments are stretched, weakened or torn, the wall between the bladder and the vagina sags or herniates causing a prolapse, sometimes called a cystocele.  This condition may cause discomfort and problems with emptying the bladder.  It can be present in various stages.  Some people are not aware of the changes.  Others may notice changes at the vaginal opening or a feeling of the bladder dropping outside the body.  Causes of a Cystocele  A cystocele is usually caused by muscle straining or stretching during childbirth.  In addition, cystocele is more common after menopause, because the hormone estrogen helps  keep the elastic tissues around the pelvic organs strong.  A cystocele is more likely to occur when levels of estrogen decrease.  Other causes include: heavy lifting, chronic coughing, previous pelvic surgery and obesity.  Symptoms  A bladder that has dropped from its normal position may cause: unwanted urine leakage (stress incontinence), frequent urination or urge to urinate, incomplete emptying of the bladder (not feeling bladder relief after emptying), pain or discomfort in the vagina, pelvis, groin, lower back or lower abdomen and frequent urinary tract infections.  Mild cases may not cause any symptoms.  Treatment Options  Pelvic floor (Kegel) exercises:  Strength training the muscles in your genital area  Behavioral changes: Treating and preventing constipation, taking time to empty your bladder properly, learning to lift properly and/or avoid heavy lifting when possible, stopping smoking, avoiding weight gain and treating a chronic cough or bronchitis.  A pessary: A vaginal support device is sometimes used to help pelvic support caused by muscle and ligament changes.  Surgery: Surgical repair may be necessary if symptoms cannot be managed with exercise, behavioral changes and a pessary.  Surgery is usually considered for severe cases.   2007, [employer]

## 2020-04-30 NOTE — Telephone Encounter (Signed)
Left message for pt to return call to triage RN. 

## 2020-05-12 ENCOUNTER — Inpatient Hospital Stay: Payer: No Typology Code available for payment source | Admitting: Family

## 2020-05-12 ENCOUNTER — Inpatient Hospital Stay: Payer: No Typology Code available for payment source

## 2020-05-19 DIAGNOSIS — I83813 Varicose veins of bilateral lower extremities with pain: Secondary | ICD-10-CM | POA: Diagnosis not present

## 2020-05-19 DIAGNOSIS — I83893 Varicose veins of bilateral lower extremities with other complications: Secondary | ICD-10-CM | POA: Diagnosis not present

## 2020-05-19 DIAGNOSIS — I8312 Varicose veins of left lower extremity with inflammation: Secondary | ICD-10-CM | POA: Diagnosis not present

## 2020-05-19 DIAGNOSIS — I8311 Varicose veins of right lower extremity with inflammation: Secondary | ICD-10-CM | POA: Diagnosis not present

## 2020-05-20 ENCOUNTER — Other Ambulatory Visit: Payer: Self-pay

## 2020-05-20 ENCOUNTER — Telehealth: Payer: Self-pay

## 2020-05-20 ENCOUNTER — Ambulatory Visit: Payer: Medicare HMO | Admitting: Obstetrics and Gynecology

## 2020-05-20 ENCOUNTER — Encounter: Payer: Self-pay | Admitting: Obstetrics and Gynecology

## 2020-05-20 VITALS — BP 160/100 | HR 57 | Ht 67.0 in | Wt 149.8 lb

## 2020-05-20 DIAGNOSIS — N8111 Cystocele, midline: Secondary | ICD-10-CM | POA: Diagnosis not present

## 2020-05-20 DIAGNOSIS — T8389XA Other specified complication of genitourinary prosthetic devices, implants and grafts, initial encounter: Secondary | ICD-10-CM

## 2020-05-20 DIAGNOSIS — N952 Postmenopausal atrophic vaginitis: Secondary | ICD-10-CM

## 2020-05-20 DIAGNOSIS — N898 Other specified noninflammatory disorders of vagina: Secondary | ICD-10-CM

## 2020-05-20 MED ORDER — ESTRADIOL 0.1 MG/GM VA CREA: TOPICAL_CREAM | VAGINAL | 1 refills | Status: DC

## 2020-05-20 NOTE — Telephone Encounter (Signed)
Spoke with pt. Pt needing pessary fitting appt per Dr Talbert Nan. Pt scheduled for 11/23 at 130 pm. Pt agreeable to date and time of appt.  Encounter closed

## 2020-05-20 NOTE — Telephone Encounter (Signed)
Call placed to patient to follow up about elevated blood pressure in the office. Dr. Talbert Nan wants to be sure she see her PCP for monitoring.

## 2020-05-20 NOTE — Telephone Encounter (Signed)
Patient need a two week pessary fitting appointment with Dr. Talbert Nan. Sending to triage to assist with scheduling.

## 2020-05-20 NOTE — [facility] Notes (Signed)
GYNECOLOGY  VISIT   HPI: 64 y.o.   Widowed White or Caucasian Not Hispanic or Latino  female   [phone] with No LMP recorded. Patient has had a hysterectomy.   here for follow up midline cystocele.  The patient was seen a few weeks ago and was noted to have ulceration at her vaginal apex from her pessary, she was c/o the pessary slipping. The pessary was left out (she did use it for a couple of days).  GYNECOLOGIC HISTORY: No LMP recorded. Patient has had a hysterectomy. Contraception:PMP Menopausal hormone therapy: none         OB History    Gravida  2   Para  2   Term  2   Preterm      AB      Living  2     SAB      TAB      Ectopic      Multiple      Live Births                 Patient Active Problem List   Diagnosis Date Noted  . IDA (iron deficiency anemia) 03/11/2020  . Lower GI bleed 11/06/2019  . Acute blood loss anemia 11/06/2019  . Tinnitus of both ears 08/28/2019  . Pressure sensation in both ears 08/28/2019  . Bilateral carotid bruits 08/28/2019  . Osteoporosis 08/28/2019  . Muscle cramps 08/28/2019  . Compression fracture of L4 vertebra ([place]) 11/07/2018  . Low back pain 05/28/2018  . Insomnia 05/23/2017  . Multiple food allergies 05/19/2017  . Other malaise and fatigue 08/26/2013  . High cholesterol 08/23/2013  . Bladder prolapse, female, acquired 05/16/2013  . Cough 04/19/2012  . Urinary frequency 04/27/2011  . Hypokalemia 03/10/2011  . Neutropenia ([place]) 03/10/2011  . Arthritis 01/25/2011  . Atrophic vaginitis 01/25/2011  . Preventative health care 01/25/2011  . Diverticulosis 01/25/2011  . Strain of knee   . Seasonal and perennial allergic rhinitis   . Hypertension   . Chicken pox   . Mumps   . Measles   . Goiter     Past Medical History:  Diagnosis Date  . Allergy    seasonal  . Arthritis    left hip, low back  . Atrophic vaginitis 01/25/2011  . Bladder prolapse, female, acquired 05/16/2013  . Blood transfusion without  reported diagnosis   . Chicken pox as a child  . Compression fracture of fourth lumbar vertebra ([place]) 04/25/2018  . Cough 04/19/2012  . Diverticulosis   . 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 Gallstones   . GI bleed   . Goiter   . Hypertension   . Measles as a child  . Mumps as a child  . Neutropenia 03/10/2011  . Osteoporosis   . Other and unspecified hyperlipidemia 08/23/2013  . Other malaise and fatigue 08/26/2013  . Pharyngitis, acute 03/09/2011  . Urinary frequency 04/27/2011  . Vaginitis 04/27/2011    Past Surgical History:  Procedure Laterality Date  . BIOPSY THYROID     FNA, benign  . BREAST BIOPSY Left    FNA, benign  . CHOLECYSTECTOMY  1998  . COLONOSCOPY     more than 10 years ago. Michela Pitcher it was done at [facility]  . ERCP  1999  . ESOPHAGOGASTRODUODENOSCOPY  1999   with the ERCP as well for her gallbladder  . HERNIA REPAIR Right    right, inguinal  . in grown toenails removed     partial,  b/l  . laser surgery on vericose veins     b/l  . NASAL SEPTUM SURGERY  2015  . TONSILLECTOMY  as a child  . VAGINAL HYSTERECTOMY  1988   done for polapse    Current Outpatient Medications  Medication Sig Dispense Refill  . Calcium Carbonate-Vit D-Min (CALCIUM 600+D PLUS MINERALS) 600-400 MG-UNIT TABS Take by mouth.    . Cholecalciferol (VITAMIN D3) 5000 UNITS CAPS Take 1 capsule by mouth daily.     . Coenzyme Q10 (COQ-10) 200 MG CAPS Take 1 tablet by mouth daily.     . Ferrous Sulfate (SLOW FE PO) Take 1 tablet by mouth daily.    Marland Kitchen loratadine (CLARITIN) 10 MG tablet Take 10 mg by mouth daily.    . metoprolol succinate (TOPROL-XL) 100 MG 24 hr tablet TAKE 1 TABLET (100 MG TOTAL) BY MOUTH DAILY. TAKE WITH OR IMMEDIATELY FOLLOWING A MEAL. 90 tablet 1  . Multiple Vitamin (MULTIVITAMIN) tablet Take 1 tablet by mouth daily.      . Probiotic Product (PROBIOTIC DAILY PO) Take 1 tablet by mouth daily.     . Red Yeast Rice Extract (RED YEAST RICE PO) Take 1 capsule by mouth  daily.    Marland Kitchen triamcinolone (NASACORT) 55 MCG/ACT AERO nasal inhaler Place 1 spray into the nose daily.    Marland Kitchen triamterene-hydrochlorothiazide (MAXZIDE-25) 37.5-25 MG tablet Take 1 tablet by mouth daily. 90 tablet 3  . zolpidem (AMBIEN) 10 MG tablet Take 1 tablet (10 mg total) by mouth at bedtime as needed. for sleep 15 tablet 5   No current facility-administered medications for this visit.     ALLERGIES: Lisinopril  Family History  Problem Relation Age of Onset  . Atrial fibrillation Mother   . Obesity Mother   . Diabetes Mother 71       Type 2  . Arthritis Mother        in right knee  . Heart disease Mother        atrial fibrillation, CHF  . Hernia Mother        never recovered from repair  . Hypertension Father   . Ulcers Father   . Pulmonary fibrosis Father   . Diabetes Maternal Grandmother        type 2?  . Heart attack Maternal Grandmother   . Hearing loss Maternal Grandmother        MI  . Parkinsonism Maternal Grandfather   . Stroke Paternal Grandmother   . Colon cancer Maternal Aunt   . Esophageal cancer Neg Hx     Social History   Socioeconomic History  . Marital status: Widowed    Spouse name: Not on file  . Number of children: 2  . Years of education: Not on file  . Highest education level: Not on file  Occupational History  . Occupation: NICU    Employer: [employer]  Tobacco Use  . Smoking status: Former Smoker    Packs/day: 1.00    Years: 5.00    Pack years: 5.00    Types: Cigarettes    Start date: 10    Quit date: 07/12/1976    Years since quitting: 43.8  . Smokeless tobacco: Never Used  Vaping Use  . Vaping Use: Never used  Substance and Sexual Activity  . Alcohol use: Yes    Comment: occasionally  . Drug use: No  . Sexual activity: Yes    Partners: Male    Comment: no dietary restrictions. but does minimize refined sugar  Other Topics Concern  . Not on file  Social History Narrative  . Not on file   Social Determinants of Health    Financial Resource Strain:   . Difficulty of Paying Living Expenses: Not on file  Food Insecurity:   . Worried About Charity fundraiser in the Last Year: Not on file  . Ran Out of Food in the Last Year: Not on file  Transportation Needs:   . Lack of Transportation (Medical): Not on file  . Lack of Transportation (Non-Medical): Not on file  Physical Activity:   . Days of Exercise per Week: Not on file  . Minutes of Exercise per Session: Not on file  Stress:   . Feeling of Stress : Not on file  Social Connections:   . Frequency of Communication with Friends and Family: Not on file  . Frequency of Social Gatherings with Friends and Family: Not on file  . Attends Religious Services: Not on file  . Active Member of Clubs or Organizations: Not on file  . Attends Archivist Meetings: Not on file  . Marital Status: Not on file  Intimate Partner Violence:   . Fear of Current or Ex-Partner: Not on file  . Emotionally Abused: Not on file  . Physically Abused: Not on file  . Sexually Abused: Not on file    Review of Systems  All other systems reviewed and are negative.   PHYSICAL EXAMINATION:    BP (!) 160/100   Pulse (!) 57   Ht 5\' 7"  (1.702 m)   Wt 149 lb 12.8 oz (67.9 kg)   SpO2 98%   BMI 23.46 kg/m     General appearance: alert, cooperative and appears stated age   Pelvic: External genitalia:  no lesions              Urethra:  normal appearing urethra with no masses, tenderness or lesions              Bartholins and Skenes: normal                 Vagina:atrophic, the vaginal apex is still erythematous, no longer ulcerated. Grade 2 cystocele with valsalva.               Cervix: absent                Chaperone was present for exam.  ASSESSMENT Cystocele Developed vaginal irritation from a pessary that was too small, only partially healed Vaginal atrophy    PLAN Start vaginal estrogen, no contraindications F/U in 2 weeks for a pessary fitting.     Addendum: patients bp elevated, will have her f/u with primary

## 2020-05-21 DIAGNOSIS — Z0100 Encounter for examination of eyes and vision without abnormal findings: Secondary | ICD-10-CM | POA: Diagnosis not present

## 2020-05-21 DIAGNOSIS — H524 Presbyopia: Secondary | ICD-10-CM | POA: Diagnosis not present

## 2020-06-03 ENCOUNTER — Encounter: Payer: Self-pay | Admitting: Obstetrics and Gynecology

## 2020-06-03 ENCOUNTER — Other Ambulatory Visit: Payer: Self-pay

## 2020-06-03 ENCOUNTER — Ambulatory Visit: Payer: Medicare HMO | Admitting: Obstetrics and Gynecology

## 2020-06-03 ENCOUNTER — Telehealth: Payer: Self-pay

## 2020-06-03 VITALS — BP 138/76 | HR 88 | Ht 67.0 in | Wt 153.0 lb

## 2020-06-03 DIAGNOSIS — N952 Postmenopausal atrophic vaginitis: Secondary | ICD-10-CM

## 2020-06-03 DIAGNOSIS — T8389XA Other specified complication of genitourinary prosthetic devices, implants and grafts, initial encounter: Secondary | ICD-10-CM

## 2020-06-03 DIAGNOSIS — Z4689 Encounter for fitting and adjustment of other specified devices: Secondary | ICD-10-CM

## 2020-06-03 DIAGNOSIS — N8111 Cystocele, midline: Secondary | ICD-10-CM | POA: Diagnosis not present

## 2020-06-03 DIAGNOSIS — N898 Other specified noninflammatory disorders of vagina: Secondary | ICD-10-CM

## 2020-06-03 NOTE — Telephone Encounter (Signed)
Spoke with patient and scheduled pessary check for 06-24-20 at 3:15pm to arrive at 3:00pm to register.

## 2020-06-03 NOTE — [facility] Notes (Signed)
GYNECOLOGY  VISIT   HPI: 64 y.o.   Widowed White or Caucasian Not Hispanic or Latino  female   [phone] with No LMP recorded. Patient has had a hysterectomy.   here for pessary fitting for a cystocele. H/o hysterectomy. She was noted to have ulceration of her vaginal apex last month at a pessary check, she did c/o the pessary slipping. The pessary was left out at that visit, but she did use it a few times. She f/u a few weeks ago and still had some vaginal irritation. States she hasn't used the pessary since that visit. She was given a script for estrogen cream 2 weeks ago, she hasn't used it.       GYNECOLOGIC HISTORY: No LMP recorded. Patient has had a hysterectomy. Contraception:PMP Menopausal hormone therapy: estrace        OB History    Gravida  2   Para  2   Term  2   Preterm      AB      Living  2     SAB      TAB      Ectopic      Multiple      Live Births                 Patient Active Problem List   Diagnosis Date Noted  . IDA (iron deficiency anemia) 03/11/2020  . Lower GI bleed 11/06/2019  . Acute blood loss anemia 11/06/2019  . Tinnitus of both ears 08/28/2019  . Pressure sensation in both ears 08/28/2019  . Bilateral carotid bruits 08/28/2019  . Osteoporosis 08/28/2019  . Muscle cramps 08/28/2019  . Compression fracture of L4 vertebra ([place]) 11/07/2018  . Low back pain 05/28/2018  . Insomnia 05/23/2017  . Multiple food allergies 05/19/2017  . Other malaise and fatigue 08/26/2013  . High cholesterol 08/23/2013  . Bladder prolapse, female, acquired 05/16/2013  . Cough 04/19/2012  . Urinary frequency 04/27/2011  . Hypokalemia 03/10/2011  . Neutropenia ([place]) 03/10/2011  . Arthritis 01/25/2011  . Atrophic vaginitis 01/25/2011  . Preventative health care 01/25/2011  . Diverticulosis 01/25/2011  . Strain of knee   . Seasonal and perennial allergic rhinitis   . Hypertension   . Chicken pox   . Mumps   . Measles   . Goiter     Past  Medical History:  Diagnosis Date  . Allergy    seasonal  . Arthritis    left hip, low back  . Atrophic vaginitis 01/25/2011  . Bladder prolapse, female, acquired 05/16/2013  . Blood transfusion without reported diagnosis   . Chicken pox as a child  . Compression fracture of fourth lumbar vertebra ([place]) 04/25/2018  . Cough 04/19/2012  . Diverticulosis   . 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 Gallstones   . GI bleed   . Goiter   . Hypertension   . Measles as a child  . Mumps as a child  . Neutropenia 03/10/2011  . Osteoporosis   . Other and unspecified hyperlipidemia 08/23/2013  . Other malaise and fatigue 08/26/2013  . Pharyngitis, acute 03/09/2011  . Urinary frequency 04/27/2011  . Vaginitis 04/27/2011    Past Surgical History:  Procedure Laterality Date  . BIOPSY THYROID     FNA, benign  . BREAST BIOPSY Left    FNA, benign  . CHOLECYSTECTOMY  1998  . COLONOSCOPY     more than 10 years ago. Michela Pitcher it was done at [facility]  .  ERCP  1999  . ESOPHAGOGASTRODUODENOSCOPY  1999   with the ERCP as well for her gallbladder  . HERNIA REPAIR Right    right, inguinal  . in grown toenails removed     partial, b/l  . laser surgery on vericose veins     b/l  . NASAL SEPTUM SURGERY  2015  . TONSILLECTOMY  as a child  . VAGINAL HYSTERECTOMY  1988   done for polapse    Current Outpatient Medications  Medication Sig Dispense Refill  . Calcium Carbonate-Vit D-Min (CALCIUM 600+D PLUS MINERALS) 600-400 MG-UNIT TABS Take by mouth.    . Cholecalciferol (VITAMIN D3) 5000 UNITS CAPS Take 1 capsule by mouth daily.     . Coenzyme Q10 (COQ-10) 200 MG CAPS Take 1 tablet by mouth daily.     Marland Kitchen estradiol (ESTRACE) 0.1 MG/GM vaginal cream 1 gram vaginally every night for one week, then space to twice weekly 42.5 g 1  . Ferrous Sulfate (SLOW FE PO) Take 1 tablet by mouth daily.    Marland Kitchen loratadine (CLARITIN) 10 MG tablet Take 10 mg by mouth daily.    . metoprolol succinate (TOPROL-XL) 100 MG 24 hr  tablet TAKE 1 TABLET (100 MG TOTAL) BY MOUTH DAILY. TAKE WITH OR IMMEDIATELY FOLLOWING A MEAL. 90 tablet 1  . Multiple Vitamin (MULTIVITAMIN) tablet Take 1 tablet by mouth daily.      . Probiotic Product (PROBIOTIC DAILY PO) Take 1 tablet by mouth daily.     . Red Yeast Rice Extract (RED YEAST RICE PO) Take 1 capsule by mouth daily.    Marland Kitchen triamcinolone (NASACORT) 55 MCG/ACT AERO nasal inhaler Place 1 spray into the nose daily.    Marland Kitchen triamterene-hydrochlorothiazide (MAXZIDE-25) 37.5-25 MG tablet Take 1 tablet by mouth daily. 90 tablet 3  . zolpidem (AMBIEN) 10 MG tablet Take 1 tablet (10 mg total) by mouth at bedtime as needed. for sleep 15 tablet 5   No current facility-administered medications for this visit.     ALLERGIES: Lisinopril  Family History  Problem Relation Age of Onset  . Atrial fibrillation Mother   . Obesity Mother   . Diabetes Mother 28       Type 2  . Arthritis Mother        in right knee  . Heart disease Mother        atrial fibrillation, CHF  . Hernia Mother        never recovered from repair  . Hypertension Father   . Ulcers Father   . Pulmonary fibrosis Father   . Diabetes Maternal Grandmother        type 2?  . Heart attack Maternal Grandmother   . Hearing loss Maternal Grandmother        MI  . Parkinsonism Maternal Grandfather   . Stroke Paternal Grandmother   . Colon cancer Maternal Aunt   . Esophageal cancer Neg Hx     Social History   Socioeconomic History  . Marital status: Widowed    Spouse name: Not on file  . Number of children: 2  . Years of education: Not on file  . Highest education level: Not on file  Occupational History  . Occupation: NICU    Employer: [employer]  Tobacco Use  . Smoking status: Former Smoker    Packs/day: 1.00    Years: 5.00    Pack years: 5.00    Types: Cigarettes    Start date: 42    Quit date: 07/12/1976  Years since quitting: 43.9  . Smokeless tobacco: Never Used  Vaping Use  . Vaping Use: Never used   Substance and Sexual Activity  . Alcohol use: Yes    Comment: occasionally  . Drug use: No  . Sexual activity: Yes    Partners: Male    Comment: no dietary restrictions. but does minimize refined sugar  Other Topics Concern  . Not on file  Social History Narrative  . Not on file   Social Determinants of Health   Financial Resource Strain:   . Difficulty of Paying Living Expenses: Not on file  Food Insecurity:   . Worried About Charity fundraiser in the Last Year: Not on file  . Ran Out of Food in the Last Year: Not on file  Transportation Needs:   . Lack of Transportation (Medical): Not on file  . Lack of Transportation (Non-Medical): Not on file  Physical Activity:   . Days of Exercise per Week: Not on file  . Minutes of Exercise per Session: Not on file  Stress:   . Feeling of Stress : Not on file  Social Connections:   . Frequency of Communication with Friends and Family: Not on file  . Frequency of Social Gatherings with Friends and Family: Not on file  . Attends Religious Services: Not on file  . Active Member of Clubs or Organizations: Not on file  . Attends Archivist Meetings: Not on file  . Marital Status: Not on file  Intimate Partner Violence:   . Fear of Current or Ex-Partner: Not on file  . Emotionally Abused: Not on file  . Physically Abused: Not on file  . Sexually Abused: Not on file    Review of Systems  All other systems reviewed and are negative.   PHYSICAL EXAMINATION:    BP 138/76   Pulse 88   Ht 5\' 7"  (1.702 m)   Wt 153 lb (69.4 kg)   SpO2 99%   BMI 23.96 kg/m     General appearance: alert, cooperative and appears stated age  Pelvic: External genitalia:  no lesions              Urethra:  normal appearing urethra with no masses, tenderness or lesions              Bartholins and Skenes: normal                 Vagina: there is still irritation at the vaginal apex. Fitted with a #4 ring pessary, comfortable in the office                Cervix: absent  Chaperone was present for exam.  ASSESSMENT Midline cystocele Vaginal atrophy Vaginal irritation    PLAN Fitted with a #4 ring pessary She will start the estrace cream, 1 gram vaginally qhs x1 week, then 1 gram 2 x a week at hs. Take the pessary out overnight with use of the pessary F/U in 2 weeks, call with any concerns.

## 2020-06-03 NOTE — Patient Instructions (Signed)
Use 1 gram of estrace cream every night for one week, then change to 2 x a week. Take the pessary out overnight when you use the cream.

## 2020-06-03 NOTE — Telephone Encounter (Signed)
Patient need a two week pessary recheck with Dr Talbert Nan. Sending to triage to assist with scheduling.

## 2020-06-16 DIAGNOSIS — I83893 Varicose veins of bilateral lower extremities with other complications: Secondary | ICD-10-CM | POA: Diagnosis not present

## 2020-06-16 DIAGNOSIS — I8312 Varicose veins of left lower extremity with inflammation: Secondary | ICD-10-CM | POA: Diagnosis not present

## 2020-06-16 DIAGNOSIS — I8311 Varicose veins of right lower extremity with inflammation: Secondary | ICD-10-CM | POA: Diagnosis not present

## 2020-06-16 DIAGNOSIS — I83813 Varicose veins of bilateral lower extremities with pain: Secondary | ICD-10-CM | POA: Diagnosis not present

## 2020-06-24 ENCOUNTER — Encounter: Payer: Self-pay | Admitting: Obstetrics and Gynecology

## 2020-06-24 ENCOUNTER — Ambulatory Visit (INDEPENDENT_AMBULATORY_CARE_PROVIDER_SITE_OTHER): Payer: Medicare HMO | Admitting: Obstetrics and Gynecology

## 2020-06-24 ENCOUNTER — Other Ambulatory Visit: Payer: Self-pay

## 2020-06-24 VITALS — BP 126/74 | HR 59 | Ht 67.0 in | Wt 151.6 lb

## 2020-06-24 DIAGNOSIS — N952 Postmenopausal atrophic vaginitis: Secondary | ICD-10-CM

## 2020-06-24 DIAGNOSIS — T8389XA Other specified complication of genitourinary prosthetic devices, implants and grafts, initial encounter: Secondary | ICD-10-CM | POA: Diagnosis not present

## 2020-06-24 DIAGNOSIS — N898 Other specified noninflammatory disorders of vagina: Secondary | ICD-10-CM

## 2020-06-24 DIAGNOSIS — N8111 Cystocele, midline: Secondary | ICD-10-CM

## 2020-06-24 NOTE — [facility] Notes (Signed)
GYNECOLOGY  VISIT   HPI: 64 y.o.   Widowed White or Caucasian Not Hispanic or Latino  female   [phone] with No LMP recorded. Patient has had a hysterectomy.   here for pessary follow up.   She was fitted with a #4 ring pessary a few weeks ago. She had some vaginal irritation from a smaller pessary that was slipping. At the time of her pessary fitting she had been without a pessary for a few weeks and still had vaginal irritation. She was started on estrace cream.  She is doing well, this pessary is more comfortable, not slipping. She is taking the pessary out overnight when she uses the cream.   GYNECOLOGIC HISTORY: No LMP recorded. Patient has had a hysterectomy. Contraception:PMP  Menopausal hormone therapy: estrace        OB History    Gravida  2   Para  2   Term  2   Preterm      AB      Living  2     SAB      IAB      Ectopic      Multiple      Live Births                 Patient Active Problem List   Diagnosis Date Noted  . IDA (iron deficiency anemia) 03/11/2020  . Lower GI bleed 11/06/2019  . Acute blood loss anemia 11/06/2019  . Tinnitus of both ears 08/28/2019  . Pressure sensation in both ears 08/28/2019  . Bilateral carotid bruits 08/28/2019  . Osteoporosis 08/28/2019  . Muscle cramps 08/28/2019  . Compression fracture of L4 vertebra ([place]) 11/07/2018  . Low back pain 05/28/2018  . Insomnia 05/23/2017  . Multiple food allergies 05/19/2017  . Other malaise and fatigue 08/26/2013  . High cholesterol 08/23/2013  . Bladder prolapse, female, acquired 05/16/2013  . Cough 04/19/2012  . Urinary frequency 04/27/2011  . Hypokalemia 03/10/2011  . Neutropenia ([place]) 03/10/2011  . Arthritis 01/25/2011  . Atrophic vaginitis 01/25/2011  . Preventative health care 01/25/2011  . Diverticulosis 01/25/2011  . Strain of knee   . Seasonal and perennial allergic rhinitis   . Hypertension   . Chicken pox   . Mumps   . Measles   . Goiter     Past Medical  History:  Diagnosis Date  . Allergy    seasonal  . Arthritis    left hip, low back  . Atrophic vaginitis 01/25/2011  . Bladder prolapse, female, acquired 05/16/2013  . Blood transfusion without reported diagnosis   . Chicken pox as a child  . Compression fracture of fourth lumbar vertebra ([place]) 04/25/2018  . Cough 04/19/2012  . Diverticulosis   . 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 Gallstones   . GI bleed   . Goiter   . Hypertension   . Measles as a child  . Mumps as a child  . Neutropenia 03/10/2011  . Osteoporosis   . Other and unspecified hyperlipidemia 08/23/2013  . Other malaise and fatigue 08/26/2013  . Pharyngitis, acute 03/09/2011  . Urinary frequency 04/27/2011  . Vaginitis 04/27/2011    Past Surgical History:  Procedure Laterality Date  . BIOPSY THYROID     FNA, benign  . BREAST BIOPSY Left    FNA, benign  . CHOLECYSTECTOMY  1998  . COLONOSCOPY     more than 10 years ago. Michela Pitcher it was done at [facility]  . ERCP  1999  .  ESOPHAGOGASTRODUODENOSCOPY  1999   with the ERCP as well for her gallbladder  . HERNIA REPAIR Right    right, inguinal  . in grown toenails removed     partial, b/l  . laser surgery on vericose veins     b/l  . NASAL SEPTUM SURGERY  2015  . TONSILLECTOMY  as a child  . VAGINAL HYSTERECTOMY  1988   done for polapse    Current Outpatient Medications  Medication Sig Dispense Refill  . Calcium Carbonate-Vit D-Min (CALCIUM 600+D PLUS MINERALS) 600-400 MG-UNIT TABS Take by mouth.    . Cholecalciferol (VITAMIN D3) 5000 UNITS CAPS Take 1 capsule by mouth daily.     . Coenzyme Q10 (COQ-10) 200 MG CAPS Take 1 tablet by mouth daily.     Marland Kitchen estradiol (ESTRACE) 0.1 MG/GM vaginal cream 1 gram vaginally every night for one week, then space to twice weekly 42.5 g 1  . Ferrous Sulfate (SLOW FE PO) Take 1 tablet by mouth daily.    Marland Kitchen loratadine (CLARITIN) 10 MG tablet Take 10 mg by mouth daily.    . metoprolol succinate (TOPROL-XL) 100 MG 24 hr tablet  TAKE 1 TABLET (100 MG TOTAL) BY MOUTH DAILY. TAKE WITH OR IMMEDIATELY FOLLOWING A MEAL. 90 tablet 1  . Multiple Vitamin (MULTIVITAMIN) tablet Take 1 tablet by mouth daily.    . Probiotic Product (PROBIOTIC DAILY PO) Take 1 tablet by mouth daily.     . Red Yeast Rice Extract (RED YEAST RICE PO) Take 1 capsule by mouth daily.    Marland Kitchen triamcinolone (NASACORT) 55 MCG/ACT AERO nasal inhaler Place 1 spray into the nose daily.    Marland Kitchen triamterene-hydrochlorothiazide (MAXZIDE-25) 37.5-25 MG tablet Take 1 tablet by mouth daily. 90 tablet 3   No current facility-administered medications for this visit.     ALLERGIES: Lisinopril  Family History  Problem Relation Age of Onset  . Atrial fibrillation Mother   . Obesity Mother   . Diabetes Mother 15       Type 2  . Arthritis Mother        in right knee  . Heart disease Mother        atrial fibrillation, CHF  . Hernia Mother        never recovered from repair  . Hypertension Father   . Ulcers Father   . Pulmonary fibrosis Father   . Diabetes Maternal Grandmother        type 2?  . Heart attack Maternal Grandmother   . Hearing loss Maternal Grandmother        MI  . Parkinsonism Maternal Grandfather   . Stroke Paternal Grandmother   . Colon cancer Maternal Aunt   . Esophageal cancer Neg Hx     Social History   Socioeconomic History  . Marital status: Widowed    Spouse name: Not on file  . Number of children: 2  . Years of education: Not on file  . Highest education level: Not on file  Occupational History  . Occupation: NICU    Employer: [employer]  Tobacco Use  . Smoking status: Former Smoker    Packs/day: 1.00    Years: 5.00    Pack years: 5.00    Types: Cigarettes    Start date: 74    Quit date: 07/12/1976    Years since quitting: 43.9  . Smokeless tobacco: Never Used  Vaping Use  . Vaping Use: Never used  Substance and Sexual Activity  . Alcohol use: Yes  Comment: occasionally  . Drug use: No  . Sexual activity: Yes     Partners: Male    Comment: no dietary restrictions. but does minimize refined sugar  Other Topics Concern  . Not on file  Social History Narrative  . Not on file   Social Determinants of Health   Financial Resource Strain: Not on file  Food Insecurity: Not on file  Transportation Needs: Not on file  Physical Activity: Not on file  Stress: Not on file  Social Connections: Not on file  Intimate Partner Violence: Not on file    Review of Systems  All other systems reviewed and are negative.   PHYSICAL EXAMINATION:    BP 126/74   Pulse (!) 59   Ht 5\' 7"  (1.702 m)   Wt 151 lb 9.6 oz (68.8 kg)   SpO2 99%   BMI 23.74 kg/m     General appearance: alert, cooperative and appears stated age  Pelvic: External genitalia:  no lesions              Urethra:  normal appearing urethra with no masses, tenderness or lesions              Bartholins and Skenes: normal                 Vagina: the pessary was removed and cleaned, the vaginal apex is still irritated, superficial erosion ~3 x 1.5 cm. The vaginal apex was treated with silver nitrate                Cervix: absent               Chaperone was present for exam.  ASSESSMENT Cystocele, well controlled with #4 ring pessary Vaginal irritation still hasn't healed since prior to fitting this pessary, even with use of vaginal estrogen.     PLAN Will leave the pessary out Use a small amount of the estrace cream nightly x 2 weeks. F/U in 2 weeks.

## 2020-06-26 ENCOUNTER — Encounter: Payer: Self-pay | Admitting: Family Medicine

## 2020-06-26 ENCOUNTER — Other Ambulatory Visit: Payer: Self-pay

## 2020-06-26 ENCOUNTER — Ambulatory Visit (INDEPENDENT_AMBULATORY_CARE_PROVIDER_SITE_OTHER): Payer: Medicare HMO | Admitting: Family Medicine

## 2020-06-26 VITALS — BP 148/90 | HR 58 | Temp 98.0°F | Resp 16 | Ht 67.0 in | Wt 152.2 lb

## 2020-06-26 DIAGNOSIS — M1990 Unspecified osteoarthritis, unspecified site: Secondary | ICD-10-CM

## 2020-06-26 DIAGNOSIS — D649 Anemia, unspecified: Secondary | ICD-10-CM

## 2020-06-26 DIAGNOSIS — D62 Acute posthemorrhagic anemia: Secondary | ICD-10-CM

## 2020-06-26 DIAGNOSIS — E7800 Pure hypercholesterolemia, unspecified: Secondary | ICD-10-CM | POA: Diagnosis not present

## 2020-06-26 DIAGNOSIS — D709 Neutropenia, unspecified: Secondary | ICD-10-CM | POA: Diagnosis not present

## 2020-06-26 DIAGNOSIS — I10 Essential (primary) hypertension: Secondary | ICD-10-CM | POA: Diagnosis not present

## 2020-06-26 DIAGNOSIS — Z23 Encounter for immunization: Secondary | ICD-10-CM | POA: Diagnosis not present

## 2020-06-26 LAB — CBC
HCT: 39 % (ref 36.0–46.0)
Hemoglobin: 13.1 g/dL (ref 12.0–15.0)
MCHC: 33.6 g/dL (ref 30.0–36.0)
MCV: 88.4 fl (ref 78.0–100.0)
Platelets: 256 10*3/uL (ref 150.0–400.0)
RBC: 4.42 Mil/uL (ref 3.87–5.11)
RDW: 12.8 % (ref 11.5–15.5)
WBC: 5.4 10*3/uL (ref 4.0–10.5)

## 2020-06-26 LAB — LIPID PANEL
Cholesterol: 170 mg/dL (ref 0–200)
HDL: 54.9 mg/dL (ref >39.00)
LDL Cholesterol: 100 mg/dL — ABNORMAL HIGH (ref 0–99)
NonHDL: 115.41
Total CHOL/HDL Ratio: 3
Triglycerides: 77 mg/dL (ref 0.0–149.0)
VLDL: 15.4 mg/dL (ref 0.0–40.0)

## 2020-06-26 LAB — COMPREHENSIVE METABOLIC PANEL
ALT: 10 U/L (ref 0–35)
AST: 11 U/L (ref 0–37)
Albumin: 4.3 g/dL (ref 3.5–5.2)
Alkaline Phosphatase: 55 U/L (ref 39–117)
BUN: 17 mg/dL (ref 6–23)
CO2: 30 mEq/L (ref 19–32)
Calcium: 9.5 mg/dL (ref 8.4–10.5)
Chloride: 98 mEq/L (ref 96–112)
Creatinine, Ser: 0.65 mg/dL (ref 0.40–1.20)
GFR: 92.6 mL/min (ref >60.00)
Glucose, Bld: 86 mg/dL (ref 70–99)
Potassium: 4.2 mEq/L (ref 3.5–5.1)
Sodium: 135 mEq/L (ref 135–145)
Total Bilirubin: 0.4 mg/dL (ref 0.2–1.2)
Total Protein: 6.9 g/dL (ref 6.0–8.3)

## 2020-06-26 LAB — TSH: TSH: 0.63 u[IU]/mL (ref 0.35–4.50)

## 2020-06-26 MED ORDER — TRIAMTERENE-HCTZ 37.5-25 MG PO TABS: 1.0000 | ORAL_TABLET | Freq: Every day | ORAL | 2 refills | Status: DC

## 2020-06-26 MED ORDER — METOPROLOL SUCCINATE ER 100 MG PO TB24
100.0000 mg | ORAL_TABLET | Freq: Every day | ORAL | 2 refills | Status: DC
Start: 2020-06-26 — End: 2021-04-01

## 2020-06-26 NOTE — Patient Instructions (Signed)
Anemia  Anemia is a condition in which you do not have enough red blood cells or hemoglobin. Hemoglobin is a substance in red blood cells that carries oxygen. When you do not have enough red blood cells or hemoglobin (are anemic), your body cannot get enough oxygen and your organs may not work properly. As a result, you may feel very tired or have other problems. What are the causes? Common causes of anemia include:  Excessive bleeding. Anemia can be caused by excessive bleeding inside or outside the body, including bleeding from the intestine or from periods in women.  Poor nutrition.  Long-lasting (chronic) kidney, thyroid, and liver disease.  Bone marrow disorders.  Cancer and treatments for cancer.  HIV (human immunodeficiency virus) and AIDS (acquired immunodeficiency syndrome).  Treatments for HIV and AIDS.  Spleen problems.  Blood disorders.  Infections, medicines, and autoimmune disorders that destroy red blood cells. What are the signs or symptoms? Symptoms of this condition include:  Minor weakness.  Dizziness.  Headache.  Feeling heartbeats that are irregular or faster than normal (palpitations).  Shortness of breath, especially with exercise.  Paleness.  Cold sensitivity.  Indigestion.  Nausea.  Difficulty sleeping.  Difficulty concentrating. Symptoms may occur suddenly or develop slowly. If your anemia is mild, you may not have symptoms. How is this diagnosed? This condition is diagnosed based on:  Blood tests.  Your medical history.  A physical exam.  Bone marrow biopsy. Your health care provider may also check your stool (feces) for blood and may do additional testing to look for the cause of your bleeding. You may also have other tests, including:  Imaging tests, such as a CT scan or MRI.  Endoscopy.  Colonoscopy. How is this treated? Treatment for this condition depends on the cause. If you continue to lose a lot of blood, you may  need to be treated at a hospital. Treatment may include:  Taking supplements of iron, vitamin S31, or folic acid.  Taking a hormone medicine (erythropoietin) that can help to stimulate red blood cell growth.  Having a blood transfusion. This may be needed if you lose a lot of blood.  Making changes to your diet.  Having surgery to remove your spleen. Follow these instructions at home:  Take over-the-counter and prescription medicines only as told by your health care provider.  Take supplements only as told by your health care provider.  Follow any diet instructions that you were given.  Keep all follow-up visits as told by your health care provider. This is important. Contact a health care provider if:  You develop new bleeding anywhere in the body. Get help right away if:  You are very weak.  You are short of breath.  You have pain in your abdomen or chest.  You are dizzy or feel faint.  You have trouble concentrating.  You have bloody or black, tarry stools.  You vomit repeatedly or you vomit up blood. Summary  Anemia is a condition in which you do not have enough red blood cells or enough of a substance in your red blood cells that carries oxygen (hemoglobin).  Symptoms may occur suddenly or develop slowly.  If your anemia is mild, you may not have symptoms.  This condition is diagnosed with blood tests as well as a medical history and physical exam. Other tests may be needed.  Treatment for this condition depends on the cause of the anemia. This information is not intended to replace advice given to you by  your health care provider. Make sure you discuss any questions you have with your health care provider. Document Revised: 06/10/2017 Document Reviewed: 07/30/2016 Elsevier Patient Education  [place].

## 2020-06-27 LAB — IRON,TIBC AND FERRITIN PANEL
%SAT: 26 % (calc) (ref 16–45)
Ferritin: 18 ng/mL (ref 16–288)
Iron: 76 ug/dL (ref 45–160)
TIBC: 294 mcg/dL (calc) (ref 250–450)

## 2020-06-29 NOTE — Assessment & Plan Note (Signed)
Has resolved with recent lab work

## 2020-06-29 NOTE — [facility] Notes (Signed)
Subjective:    Patient ID: Ann Barton, female    DOB: 1956/04/05, 64 y.o.   MRN: [id number]  Chief Complaint  Patient presents with  . Follow-up    HPI Patient is in today for follow up on chronic medical concerns. No recent febrile illness or hospitalizations. She is staying active and maintains a heart healthy diet. She has tolerated all of her Covid shots. Denies CP/palp/SOB/HA/congestion/fevers/GI or GU c/o. Taking meds as prescribed  Past Medical History:  Diagnosis Date  . Allergy    seasonal  . Arthritis    left hip, low back  . Atrophic vaginitis 01/25/2011  . Bladder prolapse, female, acquired 05/16/2013  . Blood transfusion without reported diagnosis   . Chicken pox as a child  . Compression fracture of fourth lumbar vertebra ([place]) 04/25/2018  . Cough 04/19/2012  . Diverticulosis   . 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 Gallstones   . GI bleed   . Goiter   . Hypertension   . Measles as a child  . Mumps as a child  . Neutropenia 03/10/2011  . Osteoporosis   . Other and unspecified hyperlipidemia 08/23/2013  . Other malaise and fatigue 08/26/2013  . Pharyngitis, acute 03/09/2011  . Urinary frequency 04/27/2011  . Vaginitis 04/27/2011    Past Surgical History:  Procedure Laterality Date  . BIOPSY THYROID     FNA, benign  . BREAST BIOPSY Left    FNA, benign  . CHOLECYSTECTOMY  1998  . COLONOSCOPY     more than 10 years ago. Michela Pitcher it was done at [facility]  . ERCP  1999  . ESOPHAGOGASTRODUODENOSCOPY  1999   with the ERCP as well for her gallbladder  . HERNIA REPAIR Right    right, inguinal  . in grown toenails removed     partial, b/l  . laser surgery on vericose veins     b/l  . NASAL SEPTUM SURGERY  2015  . TONSILLECTOMY  as a child  . VAGINAL HYSTERECTOMY  1988   done for polapse    Family History  Problem Relation Age of Onset  . Atrial fibrillation Mother   . Obesity Mother   . Diabetes Mother 8       Type 2  . Arthritis Mother         in right knee  . Heart disease Mother        atrial fibrillation, CHF  . Hernia Mother        never recovered from repair  . Hypertension Father   . Ulcers Father   . Pulmonary fibrosis Father   . Diabetes Maternal Grandmother        type 2?  . Heart attack Maternal Grandmother   . Hearing loss Maternal Grandmother        MI  . Parkinsonism Maternal Grandfather   . Stroke Paternal Grandmother   . Colon cancer Maternal Aunt   . Esophageal cancer Neg Hx     Social History   Socioeconomic History  . Marital status: Widowed    Spouse name: Not on file  . Number of children: 2  . Years of education: Not on file  . Highest education level: Not on file  Occupational History  . Occupation: NICU    Employer: [employer]  Tobacco Use  . Smoking status: Former Smoker    Packs/day: 1.00    Years: 5.00    Pack years: 5.00    Types: Cigarettes  Start date: 22    Quit date: 07/12/1976    Years since quitting: 43.9  . Smokeless tobacco: Never Used  Vaping Use  . Vaping Use: Never used  Substance and Sexual Activity  . Alcohol use: Yes    Comment: occasionally  . Drug use: No  . Sexual activity: Yes    Partners: Male    Comment: no dietary restrictions. but does minimize refined sugar  Other Topics Concern  . Not on file  Social History Narrative  . Not on file   Social Determinants of Health   Financial Resource Strain: Not on file  Food Insecurity: Not on file  Transportation Needs: Not on file  Physical Activity: Not on file  Stress: Not on file  Social Connections: Not on file  Intimate Partner Violence: Not on file    Outpatient Medications Prior to Visit  Medication Sig Dispense Refill  . Calcium Carbonate-Vit D-Min (CALCIUM 600+D PLUS MINERALS) 600-400 MG-UNIT TABS Take by mouth.    . Cholecalciferol (VITAMIN D3) 5000 UNITS CAPS Take 1 capsule by mouth daily.     . Coenzyme Q10 (COQ-10) 200 MG CAPS Take 1 tablet by mouth daily.     Marland Kitchen estradiol (ESTRACE)  0.1 MG/GM vaginal cream 1 gram vaginally every night for one week, then space to twice weekly 42.5 g 1  . Ferrous Sulfate (SLOW FE PO) Take 1 tablet by mouth daily.    Marland Kitchen loratadine (CLARITIN) 10 MG tablet Take 10 mg by mouth daily.    . Multiple Vitamin (MULTIVITAMIN) tablet Take 1 tablet by mouth daily.    . Probiotic Product (PROBIOTIC DAILY PO) Take 1 tablet by mouth daily.     . Red Yeast Rice Extract (RED YEAST RICE PO) Take 1 capsule by mouth daily.    Marland Kitchen triamcinolone (NASACORT) 55 MCG/ACT AERO nasal inhaler Place 1 spray into the nose daily.    . metoprolol succinate (TOPROL-XL) 100 MG 24 hr tablet TAKE 1 TABLET (100 MG TOTAL) BY MOUTH DAILY. TAKE WITH OR IMMEDIATELY FOLLOWING A MEAL. 90 tablet 1  . triamterene-hydrochlorothiazide (MAXZIDE-25) 37.5-25 MG tablet Take 1 tablet by mouth daily. 90 tablet 3   No facility-administered medications prior to visit.    Allergies  Allergen Reactions  . Lisinopril Cough    Review of Systems  Constitutional: Negative for fever and malaise/fatigue.  HENT: Negative for congestion.   Eyes: Negative for blurred vision.  Respiratory: Negative for shortness of breath.   Cardiovascular: Negative for chest pain, palpitations and leg swelling.  Gastrointestinal: Negative for abdominal pain, blood in stool and nausea.  Genitourinary: Negative for dysuria and frequency.  Musculoskeletal: Positive for joint pain. Negative for falls.  Skin: Negative for rash.  Neurological: Negative for dizziness, loss of consciousness and headaches.  Endo/Heme/Allergies: Negative for environmental allergies.  Psychiatric/Behavioral: Negative for depression. The patient is not nervous/anxious.        Objective:    Physical Exam Vitals and nursing note reviewed.  Constitutional:      General: She is not in acute distress.    Appearance: She is well-developed and well-nourished.  HENT:     Head: Normocephalic and atraumatic.     Nose: Nose normal.  Eyes:      General:        Right eye: No discharge.        Left eye: No discharge.  Cardiovascular:     Rate and Rhythm: Normal rate and regular rhythm.     Heart sounds: No  murmur heard.   Pulmonary:     Effort: Pulmonary effort is normal.     Breath sounds: Normal breath sounds.  Abdominal:     General: Bowel sounds are normal.     Palpations: Abdomen is soft.     Tenderness: There is no abdominal tenderness.  Musculoskeletal:        General: No edema.     Cervical back: Normal range of motion and neck supple.  Skin:    General: Skin is warm and dry.  Neurological:     Mental Status: She is alert and oriented to person, place, and time.  Psychiatric:        Mood and Affect: Mood and affect normal.     BP (!) 148/90 (BP Location: Left Arm, Patient Position: Sitting, Cuff Size: Normal)   Pulse (!) 58   Temp 98 F (36.7 C) (Oral)   Resp 16   Ht 5\' 7"  (1.702 m)   Wt 152 lb 4 oz (69.1 kg)   SpO2 97%   BMI 23.85 kg/m  Wt Readings from Last 3 Encounters:  06/26/20 152 lb 4 oz (69.1 kg)  06/24/20 151 lb 9.6 oz (68.8 kg)  06/03/20 153 lb (69.4 kg)    Diabetic Foot Exam - Simple   No data filed    Lab Results  Component Value Date   WBC 5.4 06/26/2020   HGB 13.1 06/26/2020   HCT 39.0 06/26/2020   PLT 256.0 06/26/2020   GLUCOSE 86 06/26/2020   CHOL 170 06/26/2020   TRIG 77.0 06/26/2020   HDL 54.90 06/26/2020   LDLCALC 100 (H) 06/26/2020   ALT 10 06/26/2020   AST 11 06/26/2020   NA 135 06/26/2020   K 4.2 06/26/2020   CL 98 06/26/2020   CREATININE 0.65 06/26/2020   BUN 17 06/26/2020   CO2 30 06/26/2020   TSH 0.63 06/26/2020    Lab Results  Component Value Date   TSH 0.63 06/26/2020   Lab Results  Component Value Date   WBC 5.4 06/26/2020   HGB 13.1 06/26/2020   HCT 39.0 06/26/2020   MCV 88.4 06/26/2020   PLT 256.0 06/26/2020   Lab Results  Component Value Date   NA 135 06/26/2020   K 4.2 06/26/2020   CO2 30 06/26/2020   GLUCOSE 86 06/26/2020   BUN 17  06/26/2020   CREATININE 0.65 06/26/2020   BILITOT 0.4 06/26/2020   ALKPHOS 55 06/26/2020   AST 11 06/26/2020   ALT 10 06/26/2020   PROT 6.9 06/26/2020   ALBUMIN 4.3 06/26/2020   CALCIUM 9.5 06/26/2020   ANIONGAP 5 03/11/2020   GFR 92.60 06/26/2020   Lab Results  Component Value Date   CHOL 170 06/26/2020   Lab Results  Component Value Date   HDL 54.90 06/26/2020   Lab Results  Component Value Date   LDLCALC 100 (H) 06/26/2020   Lab Results  Component Value Date   TRIG 77.0 06/26/2020   Lab Results  Component Value Date   CHOLHDL 3 06/26/2020   No results found for: HGBA1C     Assessment & Plan:   Problem List Items Addressed This Visit    Hypertension    Well controlled, no changes to meds. Encouraged heart healthy diet such as the DASH diet and exercise as tolerated.       Relevant Medications   metoprolol succinate (TOPROL-XL) 100 MG 24 hr tablet   triamterene-hydrochlorothiazide (MAXZIDE-25) 37.5-25 MG tablet   Other Relevant Orders  Comprehensive metabolic panel (Completed)   TSH (Completed)   High cholesterol - Primary    Encouraged heart healthy diet, increase exercise, avoid trans fats, consider a krill oil cap daily      Relevant Medications   metoprolol succinate (TOPROL-XL) 100 MG 24 hr tablet   triamterene-hydrochlorothiazide (MAXZIDE-25) 37.5-25 MG tablet   Other Relevant Orders   Lipid panel (Completed)   Arthritis    Try moist heat and gentle and stay active. May try NSAIDs and prescription meds as directed and report if symptoms worsen or seek immediate care      Neutropenia ([place])   Relevant Orders   CBC (Completed)   Anemia    Has resolved with recent lab work       Other Visit Diagnoses    Essential hypertension       Relevant Medications   metoprolol succinate (TOPROL-XL) 100 MG 24 hr tablet   triamterene-hydrochlorothiazide (MAXZIDE-25) 37.5-25 MG tablet   Need for Streptococcus pneumoniae vaccination       Relevant Orders    Pneumococcal polysaccharide vaccine 23-valent greater than or equal to 2yo subcutaneous/IM (Completed)      I am having Butch Penny L. Biehl maintain her multivitamin, Vitamin D3, Probiotic Product (PROBIOTIC DAILY PO), CoQ-10, Calcium 600+D Plus Minerals, Red Yeast Rice Extract (RED YEAST RICE PO), loratadine, Ferrous Sulfate (SLOW FE PO), triamcinolone, estradiol, metoprolol succinate, and triamterene-hydrochlorothiazide.  Meds ordered this encounter  Medications  . metoprolol succinate (TOPROL-XL) 100 MG 24 hr tablet    Sig: Take 1 tablet (100 mg total) by mouth daily. Take with or immediately following a meal.    Dispense:  90 tablet    Refill:  2  . triamterene-hydrochlorothiazide (MAXZIDE-25) 37.5-25 MG tablet    Sig: Take 1 tablet by mouth daily.    Dispense:  90 tablet    Refill:  2     Penni Homans, MD

## 2020-06-29 NOTE — Assessment & Plan Note (Signed)
Try moist heat and gentle and stay active. May try NSAIDs and prescription meds as directed and report if symptoms worsen or seek immediate care

## 2020-06-29 NOTE — Assessment & Plan Note (Signed)
Encouraged heart healthy diet, increase exercise, avoid trans fats, consider a krill oil cap daily 

## 2020-06-29 NOTE — Assessment & Plan Note (Signed)
Well controlled, no changes to meds. Encouraged heart healthy diet such as the DASH diet and exercise as tolerated.  °

## 2020-07-07 ENCOUNTER — Telehealth: Payer: Self-pay

## 2020-07-07 ENCOUNTER — Encounter: Payer: Self-pay | Admitting: Obstetrics and Gynecology

## 2020-07-07 ENCOUNTER — Other Ambulatory Visit: Payer: Self-pay

## 2020-07-07 ENCOUNTER — Ambulatory Visit: Payer: Medicare HMO | Admitting: Obstetrics and Gynecology

## 2020-07-07 VITALS — BP 124/74 | HR 79 | Ht 67.0 in | Wt 151.8 lb

## 2020-07-07 DIAGNOSIS — N8111 Cystocele, midline: Secondary | ICD-10-CM

## 2020-07-07 DIAGNOSIS — N952 Postmenopausal atrophic vaginitis: Secondary | ICD-10-CM | POA: Diagnosis not present

## 2020-07-07 DIAGNOSIS — Z4689 Encounter for fitting and adjustment of other specified devices: Secondary | ICD-10-CM | POA: Diagnosis not present

## 2020-07-07 NOTE — [facility] Notes (Signed)
GYNECOLOGY  VISIT   HPI: 64 y.o.   Widowed White or Caucasian Not Hispanic or Latino  female   [phone] with No LMP recorded. Patient has had a hysterectomy.   here for 2 week follow up of her cystocele and vaginal irritation from her pessary. She has had her pessary out most of the last 2 weeks..  Her vagina hadn't healed from the prior #2 ring pessary when she was fitted with the #4 pessary last month. The #4 pessary was left out 2 weeks ago to try and heal and she was instructed to use the estrace cream nightly. No vaginal bleeding.   GYNECOLOGIC HISTORY: No LMP recorded. Patient has had a hysterectomy. Contraception:none  Menopausal hormone therapy: estrace         OB History    Gravida  2   Para  2   Term  2   Preterm      AB      Living  2     SAB      IAB      Ectopic      Multiple      Live Births                 Patient Active Problem List   Diagnosis Date Noted  . IDA (iron deficiency anemia) 03/11/2020  . Lower GI bleed 11/06/2019  . Anemia 11/06/2019  . Tinnitus of both ears 08/28/2019  . Pressure sensation in both ears 08/28/2019  . Bilateral carotid bruits 08/28/2019  . Osteoporosis 08/28/2019  . Muscle cramps 08/28/2019  . Compression fracture of L4 vertebra ([place]) 11/07/2018  . Low back pain 05/28/2018  . Insomnia 05/23/2017  . Multiple food allergies 05/19/2017  . Other malaise and fatigue 08/26/2013  . High cholesterol 08/23/2013  . Bladder prolapse, female, acquired 05/16/2013  . Cough 04/19/2012  . Urinary frequency 04/27/2011  . Hypokalemia 03/10/2011  . Neutropenia ([place]) 03/10/2011  . Arthritis 01/25/2011  . Atrophic vaginitis 01/25/2011  . Preventative health care 01/25/2011  . Diverticulosis 01/25/2011  . Strain of knee   . Seasonal and perennial allergic rhinitis   . Hypertension   . Chicken pox   . Mumps   . Measles   . Goiter     Past Medical History:  Diagnosis Date  . Allergy    seasonal  . Arthritis    left  hip, low back  . Atrophic vaginitis 01/25/2011  . Bladder prolapse, female, acquired 05/16/2013  . Blood transfusion without reported diagnosis   . Chicken pox as a child  . Compression fracture of fourth lumbar vertebra ([place]) 04/25/2018  . Cough 04/19/2012  . Diverticulosis   . 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 Gallstones   . GI bleed   . Goiter   . Hypertension   . Measles as a child  . Mumps as a child  . Neutropenia 03/10/2011  . Osteoporosis   . Other and unspecified hyperlipidemia 08/23/2013  . Other malaise and fatigue 08/26/2013  . Pharyngitis, acute 03/09/2011  . Urinary frequency 04/27/2011  . Vaginitis 04/27/2011    Past Surgical History:  Procedure Laterality Date  . BIOPSY THYROID     FNA, benign  . BREAST BIOPSY Left    FNA, benign  . CHOLECYSTECTOMY  1998  . COLONOSCOPY     more than 10 years ago. Michela Pitcher it was done at [facility]  . ERCP  1999  . ESOPHAGOGASTRODUODENOSCOPY  1999   with the ERCP as  well for her gallbladder  . HERNIA REPAIR Right    right, inguinal  . in grown toenails removed     partial, b/l  . laser surgery on vericose veins     b/l  . NASAL SEPTUM SURGERY  2015  . TONSILLECTOMY  as a child  . VAGINAL HYSTERECTOMY  1988   done for polapse    Current Outpatient Medications  Medication Sig Dispense Refill  . Calcium Carbonate-Vit D-Min (CALCIUM 600+D PLUS MINERALS) 600-400 MG-UNIT TABS Take by mouth.    . Cholecalciferol (VITAMIN D3) 5000 UNITS CAPS Take 1 capsule by mouth daily.     . Coenzyme Q10 (COQ-10) 200 MG CAPS Take 1 tablet by mouth daily.     Marland Kitchen estradiol (ESTRACE) 0.1 MG/GM vaginal cream 1 gram vaginally every night for one week, then space to twice weekly 42.5 g 1  . Ferrous Sulfate (SLOW FE PO) Take 1 tablet by mouth daily.    Marland Kitchen loratadine (CLARITIN) 10 MG tablet Take 10 mg by mouth daily.    . metoprolol succinate (TOPROL-XL) 100 MG 24 hr tablet Take 1 tablet (100 mg total) by mouth daily. Take with or immediately  following a meal. 90 tablet 2  . Multiple Vitamin (MULTIVITAMIN) tablet Take 1 tablet by mouth daily.    . Probiotic Product (PROBIOTIC DAILY PO) Take 1 tablet by mouth daily.     . Red Yeast Rice Extract (RED YEAST RICE PO) Take 1 capsule by mouth daily.    Marland Kitchen triamcinolone (NASACORT) 55 MCG/ACT AERO nasal inhaler Place 1 spray into the nose daily.    Marland Kitchen triamterene-hydrochlorothiazide (MAXZIDE-25) 37.5-25 MG tablet Take 1 tablet by mouth daily. 90 tablet 2   No current facility-administered medications for this visit.     ALLERGIES: Lisinopril  Family History  Problem Relation Age of Onset  . Atrial fibrillation Mother   . Obesity Mother   . Diabetes Mother 80       Type 2  . Arthritis Mother        in right knee  . Heart disease Mother        atrial fibrillation, CHF  . Hernia Mother        never recovered from repair  . Hypertension Father   . Ulcers Father   . Pulmonary fibrosis Father   . Diabetes Maternal Grandmother        type 2?  . Heart attack Maternal Grandmother   . Hearing loss Maternal Grandmother        MI  . Parkinsonism Maternal Grandfather   . Stroke Paternal Grandmother   . Colon cancer Maternal Aunt   . Esophageal cancer Neg Hx     Social History   Socioeconomic History  . Marital status: Widowed    Spouse name: Not on file  . Number of children: 2  . Years of education: Not on file  . Highest education level: Not on file  Occupational History  . Occupation: NICU    Employer: [employer]  Tobacco Use  . Smoking status: Former Smoker    Packs/day: 1.00    Years: 5.00    Pack years: 5.00    Types: Cigarettes    Start date: 73    Quit date: 07/12/1976    Years since quitting: 44.0  . Smokeless tobacco: Never Used  Vaping Use  . Vaping Use: Never used  Substance and Sexual Activity  . Alcohol use: Yes    Comment: occasionally  . Drug use:  No  . Sexual activity: Yes    Partners: Male    Comment: no dietary restrictions. but does  minimize refined sugar  Other Topics Concern  . Not on file  Social History Narrative  . Not on file   Social Determinants of Health   Financial Resource Strain: Not on file  Food Insecurity: Not on file  Transportation Needs: Not on file  Physical Activity: Not on file  Stress: Not on file  Social Connections: Not on file  Intimate Partner Violence: Not on file    Review of Systems  All other systems reviewed and are negative.   PHYSICAL EXAMINATION:    There were no vitals taken for this visit.    General appearance: alert, cooperative and appears stated age  Pelvic: External genitalia:  no lesions              Urethra:  normal appearing urethra with no masses, tenderness or lesions              Bartholins and Skenes: normal                 Vagina: mildly atrophic vagina, the vaginal apex is mostly healed, only with mild erythema. #4 pessary was replaced.               Cervix: absent              Chaperone was present for exam.  ASSESSMENT Cystocele, controlled with pessary, but with some vaginal estrogen Much improved with leaving the pessary out and using estrogen cream    PLAN Pessary replaced, she will take it out nightly for the next 2 weeks and use the estrace cream 3 x a week F/U in 2 weeks.

## 2020-07-07 NOTE — Telephone Encounter (Signed)
Patient need a two week pessary recheck. Sending to triage to assist with scheduling.

## 2020-07-07 NOTE — Telephone Encounter (Signed)
Spoke with patient.  OV scheduled for 07/23/19 at 2:15pm w/ Dr. Oscar La.  Patient verbalizes understanding and is agreeable.  Encounter closed.

## 2020-07-18 DIAGNOSIS — I8311 Varicose veins of right lower extremity with inflammation: Secondary | ICD-10-CM | POA: Diagnosis not present

## 2020-07-18 DIAGNOSIS — I83811 Varicose veins of right lower extremities with pain: Secondary | ICD-10-CM | POA: Diagnosis not present

## 2020-07-21 DIAGNOSIS — I83811 Varicose veins of right lower extremities with pain: Secondary | ICD-10-CM | POA: Diagnosis not present

## 2020-07-21 DIAGNOSIS — I8311 Varicose veins of right lower extremity with inflammation: Secondary | ICD-10-CM | POA: Diagnosis not present

## 2020-07-22 ENCOUNTER — Ambulatory Visit: Payer: Medicare HMO | Admitting: Obstetrics and Gynecology

## 2020-07-22 ENCOUNTER — Other Ambulatory Visit: Payer: Self-pay

## 2020-07-22 ENCOUNTER — Encounter: Payer: Self-pay | Admitting: Obstetrics and Gynecology

## 2020-07-22 VITALS — BP 112/60 | HR 64 | Ht 67.0 in | Wt 154.0 lb

## 2020-07-22 DIAGNOSIS — N8111 Cystocele, midline: Secondary | ICD-10-CM

## 2020-07-22 DIAGNOSIS — N952 Postmenopausal atrophic vaginitis: Secondary | ICD-10-CM

## 2020-07-22 DIAGNOSIS — Z4689 Encounter for fitting and adjustment of other specified devices: Secondary | ICD-10-CM

## 2020-07-22 NOTE — [facility] Notes (Signed)
GYNECOLOGY  VISIT   HPI: 65 y.o.   Widowed White or Caucasian Not Hispanic or Latino  female   [phone] with No LMP recorded. Patient has had a hysterectomy.   here for a 2 week pessary check.    The patient was changed from a #2 ring pessary to a #4 ring pessary in 11/21. She had vaginal irritation at the time of the change and has had some trouble healing the irritation. In mid December the ring was left out for a few weeks and for the last few weeks she has been taking the pessary out nightly. She has been using estrogen cream. Doing well, no c/o. No vaginal bleeding. Normal BM and voiding normally.   GYNECOLOGIC HISTORY: No LMP recorded. Patient has had a hysterectomy. Contraception:Postmenopausal/Hysterectomy Menopausal hormone therapy: Estrace        OB History    Gravida  2   Para  2   Term  2   Preterm      AB      Living  2     SAB      IAB      Ectopic      Multiple      Live Births                 Patient Active Problem List   Diagnosis Date Noted  . IDA (iron deficiency anemia) 03/11/2020  . Lower GI bleed 11/06/2019  . Anemia 11/06/2019  . Tinnitus of both ears 08/28/2019  . Pressure sensation in both ears 08/28/2019  . Bilateral carotid bruits 08/28/2019  . Osteoporosis 08/28/2019  . Muscle cramps 08/28/2019  . Compression fracture of L4 vertebra ([place]) 11/07/2018  . Low back pain 05/28/2018  . Insomnia 05/23/2017  . Multiple food allergies 05/19/2017  . Other malaise and fatigue 08/26/2013  . High cholesterol 08/23/2013  . Bladder prolapse, female, acquired 05/16/2013  . Cough 04/19/2012  . Urinary frequency 04/27/2011  . Hypokalemia 03/10/2011  . Neutropenia ([place]) 03/10/2011  . Arthritis 01/25/2011  . Atrophic vaginitis 01/25/2011  . Preventative health care 01/25/2011  . Diverticulosis 01/25/2011  . Strain of knee   . Seasonal and perennial allergic rhinitis   . Hypertension   . Chicken pox   . Mumps   . Measles   . Goiter      Past Medical History:  Diagnosis Date  . Allergy    seasonal  . Arthritis    left hip, low back  . Atrophic vaginitis 01/25/2011  . Bladder prolapse, female, acquired 05/16/2013  . Blood transfusion without reported diagnosis   . Chicken pox as a child  . Compression fracture of fourth lumbar vertebra ([place]) 04/25/2018  . Cough 04/19/2012  . Diverticulosis   . 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 Gallstones   . GI bleed   . Goiter   . Hypertension   . Measles as a child  . Mumps as a child  . Neutropenia 03/10/2011  . Osteoporosis   . Other and unspecified hyperlipidemia 08/23/2013  . Other malaise and fatigue 08/26/2013  . Pharyngitis, acute 03/09/2011  . Urinary frequency 04/27/2011  . Vaginitis 04/27/2011    Past Surgical History:  Procedure Laterality Date  . BIOPSY THYROID     FNA, benign  . BREAST BIOPSY Left    FNA, benign  . CHOLECYSTECTOMY  1998  . COLONOSCOPY     more than 10 years ago. Michela Pitcher it was done at [facility]  . ERCP  [place]   with the ERCP as well for her gallbladder  . HERNIA REPAIR Right    right, inguinal  . in grown toenails removed     partial, b/l  . laser surgery on vericose veins     b/l  . NASAL SEPTUM SURGERY  2015  . TONSILLECTOMY  as a child  . VAGINAL HYSTERECTOMY  1988   done for polapse  . VARICOSE VEIN SURGERY      Current Outpatient Medications  Medication Sig Dispense Refill  . Calcium Carbonate-Vit D-Min (CALCIUM 600+D PLUS MINERALS) 600-400 MG-UNIT TABS Take by mouth.    . Cholecalciferol (VITAMIN D3) 5000 UNITS CAPS Take 1 capsule by mouth daily.     . Coenzyme Q10 (COQ-10) 200 MG CAPS Take 1 tablet by mouth daily.     Marland Kitchen estradiol (ESTRACE) 0.1 MG/GM vaginal cream 1 gram vaginally every night for one week, then space to twice weekly 42.5 g 1  . Ferrous Sulfate (SLOW FE PO) Take 1 tablet by mouth daily.    Marland Kitchen loratadine (CLARITIN) 10 MG tablet Take 10 mg by mouth daily.    .  metoprolol succinate (TOPROL-XL) 100 MG 24 hr tablet Take 1 tablet (100 mg total) by mouth daily. Take with or immediately following a meal. 90 tablet 2  . Multiple Vitamin (MULTIVITAMIN) tablet Take 1 tablet by mouth daily.    . Probiotic Product (PROBIOTIC DAILY PO) Take 1 tablet by mouth daily.     . Red Yeast Rice Extract (RED YEAST RICE PO) Take 1 capsule by mouth daily.    Marland Kitchen triamcinolone (NASACORT) 55 MCG/ACT AERO nasal inhaler Place 1 spray into the nose daily.    Marland Kitchen triamterene-hydrochlorothiazide (MAXZIDE-25) 37.5-25 MG tablet Take 1 tablet by mouth daily. 90 tablet 2   No current facility-administered medications for this visit.     ALLERGIES: Lisinopril  Family History  Problem Relation Age of Onset  . Atrial fibrillation Mother   . Obesity Mother   . Diabetes Mother 62       Type 2  . Arthritis Mother        in right knee  . Heart disease Mother        atrial fibrillation, CHF  . Hernia Mother        never recovered from repair  . Hypertension Father   . Ulcers Father   . Pulmonary fibrosis Father   . Diabetes Maternal Grandmother        type 2?  . Heart attack Maternal Grandmother   . Hearing loss Maternal Grandmother        MI  . Parkinsonism Maternal Grandfather   . Stroke Paternal Grandmother   . Colon cancer Maternal Aunt   . Esophageal cancer Neg Hx     Social History   Socioeconomic History  . Marital status: Widowed    Spouse name: Not on file  . Number of children: 2  . Years of education: Not on file  . Highest education level: Not on file  Occupational History  . Occupation: NICU    Employer: [employer]  Tobacco Use  . Smoking status: Former Smoker    Packs/day: 1.00    Years: 5.00    Pack years: 5.00    Types: Cigarettes    Start date: 2    Quit date: 07/12/1976    Years since quitting: 44.0  . Smokeless tobacco: Never Used  Vaping Use  . Vaping Use: Never used  Substance and Sexual Activity  . Alcohol use: Yes    Comment:  occasionally  . Drug use: No  . Sexual activity: Yes    Partners: Male    Comment: no dietary restrictions. but does minimize refined sugar  Other Topics Concern  . Not on file  Social History Narrative  . Not on file   Social Determinants of Health   Financial Resource Strain: Not on file  Food Insecurity: Not on file  Transportation Needs: Not on file  Physical Activity: Not on file  Stress: Not on file  Social Connections: Not on file  Intimate Partner Violence: Not on file    Review of Systems  Constitutional: Negative.   HENT: Negative.   Eyes: Negative.   Respiratory: Negative.   Cardiovascular: Negative.   Gastrointestinal: Negative.   Genitourinary: Negative.   Musculoskeletal: Negative.   Skin: Negative.   Neurological: Negative.   Endo/Heme/Allergies: Negative.   Psychiatric/Behavioral: Negative.     PHYSICAL EXAMINATION:    BP 112/60 (BP Location: Right Arm, Patient Position: Sitting, Cuff Size: Normal)   Pulse 64   Ht 5\' 7"  (1.702 m)   Wt 154 lb (69.9 kg)   BMI 24.12 kg/m     General appearance: alert, cooperative and appears stated age   Pelvic: External genitalia:  no lesions              Urethra:  normal appearing urethra with no masses, tenderness or lesions              Bartholins and Skenes: normal                 Vagina: the pessary was not in her vagina, normal appearing vagina with marked improvement in the vaginal apex irritation almost completely back to normal.               Cervix: absent               Chaperone was present for exam.  1. Pessary maintenance Doing much better. She will take the pessary out overnight every 1-2 nights.   2. Midline cystocele Well controlled with the pessary  3. Vaginal atrophy Doing well with vaginal estrogen.  Change vaginal estrogen to 2 x a week

## 2020-07-24 DIAGNOSIS — I8311 Varicose veins of right lower extremity with inflammation: Secondary | ICD-10-CM | POA: Diagnosis not present

## 2020-08-06 DIAGNOSIS — I83811 Varicose veins of right lower extremities with pain: Secondary | ICD-10-CM | POA: Diagnosis not present

## 2020-08-06 DIAGNOSIS — I8311 Varicose veins of right lower extremity with inflammation: Secondary | ICD-10-CM | POA: Diagnosis not present

## 2020-08-20 DIAGNOSIS — M7981 Nontraumatic hematoma of soft tissue: Secondary | ICD-10-CM | POA: Diagnosis not present

## 2020-08-20 DIAGNOSIS — I8311 Varicose veins of right lower extremity with inflammation: Secondary | ICD-10-CM | POA: Diagnosis not present

## 2020-08-26 ENCOUNTER — Encounter: Payer: Self-pay | Admitting: Obstetrics and Gynecology

## 2020-08-26 ENCOUNTER — Ambulatory Visit: Payer: Medicare HMO | Admitting: Obstetrics and Gynecology

## 2020-08-26 ENCOUNTER — Other Ambulatory Visit: Payer: Self-pay

## 2020-08-26 VITALS — BP 136/74 | HR 74 | Ht 67.0 in | Wt 156.0 lb

## 2020-08-26 DIAGNOSIS — N952 Postmenopausal atrophic vaginitis: Secondary | ICD-10-CM | POA: Diagnosis not present

## 2020-08-26 DIAGNOSIS — Z4689 Encounter for fitting and adjustment of other specified devices: Secondary | ICD-10-CM | POA: Diagnosis not present

## 2020-08-26 DIAGNOSIS — N8111 Cystocele, midline: Secondary | ICD-10-CM

## 2020-08-26 NOTE — [facility] Notes (Signed)
GYNECOLOGY  VISIT   HPI: 65 y.o.   Widowed White or Caucasian Not Hispanic or Latino  female   [phone] with No LMP recorded. Patient has had a hysterectomy.   here for pessary check, she has a #5 ring pessary with support. She is comfortable with the pessary, no bleeding, no abnormal d/c. She is taking the pessary out overnight. Ran out of vaginal estrogen 2 weeks ago. Would rather not use the estrogen.   GYNECOLOGIC HISTORY: No LMP recorded. Patient has had a hysterectomy. Contraception:hysterectomy  Menopausal hormone therapy: yes, estradiol         OB History    Gravida  2   Para  2   Term  2   Preterm      AB      Living  2     SAB      IAB      Ectopic      Multiple      Live Births                 Patient Active Problem List   Diagnosis Date Noted  . IDA (iron deficiency anemia) 03/11/2020  . Lower GI bleed 11/06/2019  . Anemia 11/06/2019  . Tinnitus of both ears 08/28/2019  . Pressure sensation in both ears 08/28/2019  . Bilateral carotid bruits 08/28/2019  . Osteoporosis 08/28/2019  . Muscle cramps 08/28/2019  . Compression fracture of L4 vertebra ([place]) 11/07/2018  . Low back pain 05/28/2018  . Insomnia 05/23/2017  . Multiple food allergies 05/19/2017  . Other malaise and fatigue 08/26/2013  . High cholesterol 08/23/2013  . Bladder prolapse, female, acquired 05/16/2013  . Cough 04/19/2012  . Urinary frequency 04/27/2011  . Hypokalemia 03/10/2011  . Neutropenia ([place]) 03/10/2011  . Arthritis 01/25/2011  . Atrophic vaginitis 01/25/2011  . Preventative health care 01/25/2011  . Diverticulosis 01/25/2011  . Strain of knee   . Seasonal and perennial allergic rhinitis   . Hypertension   . Chicken pox   . Mumps   . Measles   . Goiter     Past Medical History:  Diagnosis Date  . Allergy    seasonal  . Arthritis    left hip, low back  . Atrophic vaginitis 01/25/2011  . Bladder prolapse, female, acquired 05/16/2013  . Blood transfusion  without reported diagnosis   . Chicken pox as a child  . Compression fracture of fourth lumbar vertebra ([place]) 04/25/2018  . Cough 04/19/2012  . Diverticulosis   . 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 Gallstones   . GI bleed   . Goiter   . Hypertension   . Measles as a child  . Mumps as a child  . Neutropenia 03/10/2011  . Osteoporosis   . Other and unspecified hyperlipidemia 08/23/2013  . Other malaise and fatigue 08/26/2013  . Pharyngitis, acute 03/09/2011  . Urinary frequency 04/27/2011  . Vaginitis 04/27/2011    Past Surgical History:  Procedure Laterality Date  . BIOPSY THYROID     FNA, benign  . BREAST BIOPSY Left    FNA, benign  . CHOLECYSTECTOMY  1998  . COLONOSCOPY     more than 10 years ago. Michela Pitcher it was done at [facility]  . ERCP  1999  . ESOPHAGOGASTRODUODENOSCOPY  1999   with the ERCP as well for her gallbladder  . HERNIA REPAIR Right    right, inguinal  . in grown toenails removed     partial, b/l  . laser  surgery on vericose veins     b/l  . NASAL SEPTUM SURGERY  2015  . TONSILLECTOMY  as a child  . VAGINAL HYSTERECTOMY  1988   done for polapse  . VARICOSE VEIN SURGERY      Current Outpatient Medications  Medication Sig Dispense Refill  . Calcium Carbonate-Vit D-Min (CALCIUM 600+D PLUS MINERALS) 600-400 MG-UNIT TABS Take by mouth.    . Cholecalciferol (VITAMIN D3) 5000 UNITS CAPS Take 1 capsule by mouth daily.     . Coenzyme Q10 (COQ-10) 200 MG CAPS Take 1 tablet by mouth daily.     Marland Kitchen estradiol (ESTRACE) 0.1 MG/GM vaginal cream 1 gram vaginally every night for one week, then space to twice weekly 42.5 g 1  . Ferrous Sulfate (SLOW FE PO) Take 1 tablet by mouth daily.    Marland Kitchen loratadine (CLARITIN) 10 MG tablet Take 10 mg by mouth daily.    . metoprolol succinate (TOPROL-XL) 100 MG 24 hr tablet Take 1 tablet (100 mg total) by mouth daily. Take with or immediately following a meal. 90 tablet 2  . Multiple Vitamin (MULTIVITAMIN) tablet Take 1 tablet by mouth  daily.    . Probiotic Product (PROBIOTIC DAILY PO) Take 1 tablet by mouth daily.     . Red Yeast Rice Extract (RED YEAST RICE PO) Take 1 capsule by mouth daily.    Marland Kitchen triamcinolone (NASACORT) 55 MCG/ACT AERO nasal inhaler Place 1 spray into the nose daily.    Marland Kitchen triamterene-hydrochlorothiazide (MAXZIDE-25) 37.5-25 MG tablet Take 1 tablet by mouth daily. 90 tablet 2   No current facility-administered medications for this visit.     ALLERGIES: Lisinopril  Family History  Problem Relation Age of Onset  . Atrial fibrillation Mother   . Obesity Mother   . Diabetes Mother 33       Type 2  . Arthritis Mother        in right knee  . Heart disease Mother        atrial fibrillation, CHF  . Hernia Mother        never recovered from repair  . Hypertension Father   . Ulcers Father   . Pulmonary fibrosis Father   . Diabetes Maternal Grandmother        type 2?  . Heart attack Maternal Grandmother   . Hearing loss Maternal Grandmother        MI  . Parkinsonism Maternal Grandfather   . Stroke Paternal Grandmother   . Colon cancer Maternal Aunt   . Esophageal cancer Neg Hx     Social History   Socioeconomic History  . Marital status: Widowed    Spouse name: Not on file  . Number of children: 2  . Years of education: Not on file  . Highest education level: Not on file  Occupational History  . Occupation: NICU    Employer: [employer]  Tobacco Use  . Smoking status: Former Smoker    Packs/day: 1.00    Years: 5.00    Pack years: 5.00    Types: Cigarettes    Start date: 11    Quit date: 07/12/1976    Years since quitting: 44.1  . Smokeless tobacco: Never Used  Vaping Use  . Vaping Use: Never used  Substance and Sexual Activity  . Alcohol use: Yes    Comment: occasionally  . Drug use: No  . Sexual activity: Yes    Partners: Male    Comment: no dietary restrictions. but does minimize refined  sugar  Other Topics Concern  . Not on file  Social History Narrative  . Not on  file   Social Determinants of Health   Financial Resource Strain: Not on file  Food Insecurity: Not on file  Transportation Needs: Not on file  Physical Activity: Not on file  Stress: Not on file  Social Connections: Not on file  Intimate Partner Violence: Not on file    Review of Systems  All other systems reviewed and are negative.   PHYSICAL EXAMINATION:    There were no vitals taken for this visit.    General appearance: alert, cooperative and appears stated age  Pelvic: External genitalia:  no lesions              Urethra:  normal appearing urethra with no masses, tenderness or lesions              Bartholins and Skenes: normal                 Vagina: the pessary was already out. Atrophic appearing vagina without irritation or erosion. The pessary was replaced.              Cervix: absent                1. Pessary maintenance Doing well F/u in 2 months  2. Midline cystocele Controlled with the pessary  3. Vaginal atrophy She has stopped using vaginal estrogen, removing the pessary nightly Will f/u in 2 months.

## 2020-09-08 DIAGNOSIS — M7981 Nontraumatic hematoma of soft tissue: Secondary | ICD-10-CM | POA: Diagnosis not present

## 2020-09-08 DIAGNOSIS — I8311 Varicose veins of right lower extremity with inflammation: Secondary | ICD-10-CM | POA: Diagnosis not present

## 2020-09-11 DIAGNOSIS — I83812 Varicose veins of left lower extremities with pain: Secondary | ICD-10-CM | POA: Diagnosis not present

## 2020-09-11 DIAGNOSIS — I8312 Varicose veins of left lower extremity with inflammation: Secondary | ICD-10-CM | POA: Diagnosis not present

## 2020-09-15 DIAGNOSIS — I8312 Varicose veins of left lower extremity with inflammation: Secondary | ICD-10-CM | POA: Diagnosis not present

## 2020-10-01 DIAGNOSIS — I8312 Varicose veins of left lower extremity with inflammation: Secondary | ICD-10-CM | POA: Diagnosis not present

## 2020-10-01 DIAGNOSIS — I83892 Varicose veins of left lower extremities with other complications: Secondary | ICD-10-CM | POA: Diagnosis not present

## 2020-10-14 ENCOUNTER — Ambulatory Visit: Payer: Medicare HMO | Admitting: Podiatry

## 2020-10-14 ENCOUNTER — Encounter: Payer: Self-pay | Admitting: Podiatry

## 2020-10-14 ENCOUNTER — Other Ambulatory Visit: Payer: Self-pay

## 2020-10-14 DIAGNOSIS — M79675 Pain in left toe(s): Secondary | ICD-10-CM | POA: Diagnosis not present

## 2020-10-14 DIAGNOSIS — B351 Tinea unguium: Secondary | ICD-10-CM | POA: Diagnosis not present

## 2020-10-14 NOTE — [facility] Notes (Signed)
This patient presents to the office with chief complaint of long thick painful left toenail..  Patient says the nail is painful walking and wearing shoes.  This patient is unable to self treat.  This patient is unable to trim her nails since she is unable to reach her nails.  She presents to the office for preventative foot care services.  General Appearance  Alert, conversant and in no acute stress.  Vascular  Dorsalis pedis  are palpable  Bilaterally.  Posterior tibial pulses are weakly palpable  B/L due to ankle swelling.  Capillary return is within normal limits  bilaterally. Cold feet   Bilaterally. Absent digital hair  B/L.  Neurologic  Senn-Weinstein monofilament wire test within normal limits  bilaterally. Muscle power within normal limits bilaterally.  Nails Thick disfigured discolored nail with subungual debris left hallux nail. . No evidence of bacterial infection or drainage bilaterally.  Orthopedic  No limitations of motion  feet .  No crepitus or effusions noted.  No bony pathology or digital deformities noted.  Midfoot  DJD  B/L.  Skin  normotropic skin with no porokeratosis noted bilaterally.  No signs of infections or ulcers noted.     Onychomycosis  Nails  B/L. Painful left hallux toenail  Debridement of nails both feet followed trimming the left hallux nail with nail nipper followed by  with dremel tool.    RTC prn   Gardiner Barefoot DPM

## 2020-10-15 DIAGNOSIS — I8312 Varicose veins of left lower extremity with inflammation: Secondary | ICD-10-CM | POA: Diagnosis not present

## 2020-10-15 DIAGNOSIS — I83892 Varicose veins of left lower extremities with other complications: Secondary | ICD-10-CM | POA: Diagnosis not present

## 2020-10-21 ENCOUNTER — Ambulatory Visit: Payer: Self-pay | Admitting: Obstetrics and Gynecology

## 2020-10-22 NOTE — [facility] Notes (Signed)
GYNECOLOGY  VISIT   HPI: 65 y.o.   Widowed White or Caucasian Not Hispanic or Latino  female   [phone] with No LMP recorded. Patient has had a hysterectomy.   here for pessary check.    Using a #4 ring pessary with support. She takes the pessary out at night and puts it back in in the morning. No bleeding, no irritation, no discharge.  Feels normal with the pessary in.  No bowel or bladder c/o. No leakage. She had run out of the estrace cream at the end of January and decided to stop using it.   GYNECOLOGIC HISTORY: No LMP recorded. Patient has had a hysterectomy. Contraception: Hysterectomy Menopausal hormone therapy: Estrace 0.1 mg vaginal cream        OB History    Gravida  2   Para  2   Term  2   Preterm      AB      Living  2     SAB      IAB      Ectopic      Multiple      Live Births                 Patient Active Problem List   Diagnosis Date Noted  . Pain due to onychomycosis of toenail of left foot 10/14/2020  . IDA (iron deficiency anemia) 03/11/2020  . Lower GI bleed 11/06/2019  . Anemia 11/06/2019  . Tinnitus of both ears 08/28/2019  . Pressure sensation in both ears 08/28/2019  . Bilateral carotid bruits 08/28/2019  . Osteoporosis 08/28/2019  . Muscle cramps 08/28/2019  . Compression fracture of L4 vertebra ([place]) 11/07/2018  . Low back pain 05/28/2018  . Insomnia 05/23/2017  . Multiple food allergies 05/19/2017  . Other malaise and fatigue 08/26/2013  . High cholesterol 08/23/2013  . Bladder prolapse, female, acquired 05/16/2013  . Cough 04/19/2012  . Urinary frequency 04/27/2011  . Hypokalemia 03/10/2011  . Neutropenia ([place]) 03/10/2011  . Arthritis 01/25/2011  . Atrophic vaginitis 01/25/2011  . Preventative health care 01/25/2011  . Diverticulosis 01/25/2011  . Strain of knee   . Seasonal and perennial allergic rhinitis   . Hypertension   . Chicken pox   . Mumps   . Measles   . Goiter     Past Medical History:  Diagnosis  Date  . Allergy    seasonal  . Arthritis    left hip, low back  . Atrophic vaginitis 01/25/2011  . Bladder prolapse, female, acquired 05/16/2013  . Blood transfusion without reported diagnosis   . Chicken pox as a child  . Compression fracture of fourth lumbar vertebra ([place]) 04/25/2018  . Cough 04/19/2012  . Diverticulosis   . 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 Gallstones   . GI bleed   . Goiter   . Hypertension   . Measles as a child  . Mumps as a child  . Neutropenia 03/10/2011  . Osteoporosis   . Other and unspecified hyperlipidemia 08/23/2013  . Other malaise and fatigue 08/26/2013  . Pharyngitis, acute 03/09/2011  . Urinary frequency 04/27/2011  . Vaginitis 04/27/2011    Past Surgical History:  Procedure Laterality Date  . BIOPSY THYROID     FNA, benign  . BREAST BIOPSY Left    FNA, benign  . CHOLECYSTECTOMY  1998  . COLONOSCOPY     more than 10 years ago. Michela Pitcher it was done at [facility]  . ERCP  1999  .  ESOPHAGOGASTRODUODENOSCOPY  1999   with the ERCP as well for her gallbladder  . HERNIA REPAIR Right    right, inguinal  . in grown toenails removed     partial, b/l  . laser surgery on vericose veins     b/l  . NASAL SEPTUM SURGERY  2015  . TONSILLECTOMY  as a child  . VAGINAL HYSTERECTOMY  1988   done for polapse  . VARICOSE VEIN SURGERY      Current Outpatient Medications  Medication Sig Dispense Refill  . Calcium Carbonate-Vit D-Min (CALCIUM 600+D PLUS MINERALS) 600-400 MG-UNIT TABS Take by mouth.    . Cholecalciferol (VITAMIN D3) 5000 UNITS CAPS Take 1 capsule by mouth daily.     . Coenzyme Q10 (COQ-10) 200 MG CAPS Take 1 tablet by mouth daily.     Marland Kitchen estradiol (ESTRACE) 0.1 MG/GM vaginal cream 1 gram vaginally every night for one week, then space to twice weekly 42.5 g 1  . Ferrous Sulfate (SLOW FE PO) Take 1 tablet by mouth daily.    Marland Kitchen loratadine (CLARITIN) 10 MG tablet Take 10 mg by mouth daily.    . metoprolol succinate (TOPROL-XL) 100 MG 24 hr  tablet Take 1 tablet (100 mg total) by mouth daily. Take with or immediately following a meal. 90 tablet 2  . Multiple Vitamin (MULTIVITAMIN) tablet Take 1 tablet by mouth daily.    . Probiotic Product (PROBIOTIC DAILY PO) Take 1 tablet by mouth daily.     . Red Yeast Rice Extract (RED YEAST RICE PO) Take 1 capsule by mouth daily.    Marland Kitchen triamcinolone (NASACORT) 55 MCG/ACT AERO nasal inhaler Place 1 spray into the nose daily.    Marland Kitchen triamterene-hydrochlorothiazide (MAXZIDE-25) 37.5-25 MG tablet Take 1 tablet by mouth daily. 90 tablet 2   No current facility-administered medications for this visit.     ALLERGIES: Lisinopril  Family History  Problem Relation Age of Onset  . Atrial fibrillation Mother   . Obesity Mother   . Diabetes Mother 62       Type 2  . Arthritis Mother        in right knee  . Heart disease Mother        atrial fibrillation, CHF  . Hernia Mother        never recovered from repair  . Hypertension Father   . Ulcers Father   . Pulmonary fibrosis Father   . Diabetes Maternal Grandmother        type 2?  . Heart attack Maternal Grandmother   . Hearing loss Maternal Grandmother        MI  . Parkinsonism Maternal Grandfather   . Stroke Paternal Grandmother   . Colon cancer Maternal Aunt   . Esophageal cancer Neg Hx     Social History   Socioeconomic History  . Marital status: Widowed    Spouse name: Not on file  . Number of children: 2  . Years of education: Not on file  . Highest education level: Not on file  Occupational History  . Occupation: NICU    Employer: [employer]  Tobacco Use  . Smoking status: Former Smoker    Packs/day: 1.00    Years: 5.00    Pack years: 5.00    Types: Cigarettes    Start date: 40    Quit date: 07/12/1976    Years since quitting: 44.3  . Smokeless tobacco: Never Used  Vaping Use  . Vaping Use: Never used  Substance and  Sexual Activity  . Alcohol use: Yes    Comment: occasionally  . Drug use: No  . Sexual activity:  Yes    Partners: Male    Comment: no dietary restrictions. but does minimize refined sugar  Other Topics Concern  . Not on file  Social History Narrative  . Not on file   Social Determinants of Health   Financial Resource Strain: Not on file  Food Insecurity: Not on file  Transportation Needs: Not on file  Physical Activity: Not on file  Stress: Not on file  Social Connections: Not on file  Intimate Partner Violence: Not on file    ROS  PHYSICAL EXAMINATION:    BP 126/80 (BP Location: Right Arm, Patient Position: Sitting, Cuff Size: Normal)   Ht 5\' 7"  (1.702 m)   Wt 156 lb (70.8 kg)   BMI 24.43 kg/m     General appearance: alert, cooperative and appears stated age  Pelvic: External genitalia:  no lesions              Urethra:  normal appearing urethra with no masses, tenderness or lesions              Bartholins and Skenes: normal                 Vagina: the pessary was out, the vagina is atrophic with slight erosion at the vaginal apex, friable with palpation with a cotton swab.               Cervix: absent  Chaperone was present for exam.  1. Pessary maintenance Comfortable with the pessary. She stopped using vaginal estrogen and has developed recurrent vaginal irritation.   2. Vaginal irritation from pessary [facility]) Will take a short break from the pessary and resume vaginal estrogen  3. Vaginal atrophy Restart vaginal estrogen.

## 2020-10-23 ENCOUNTER — Encounter: Payer: Self-pay | Admitting: Obstetrics and Gynecology

## 2020-10-23 ENCOUNTER — Other Ambulatory Visit: Payer: Self-pay

## 2020-10-23 ENCOUNTER — Ambulatory Visit: Payer: Medicare HMO | Admitting: Obstetrics and Gynecology

## 2020-10-23 VITALS — BP 126/80 | Ht 67.0 in | Wt 156.0 lb

## 2020-10-23 DIAGNOSIS — N952 Postmenopausal atrophic vaginitis: Secondary | ICD-10-CM

## 2020-10-23 DIAGNOSIS — Z4689 Encounter for fitting and adjustment of other specified devices: Secondary | ICD-10-CM

## 2020-10-23 DIAGNOSIS — N898 Other specified noninflammatory disorders of vagina: Secondary | ICD-10-CM

## 2020-10-23 DIAGNOSIS — T8389XA Other specified complication of genitourinary prosthetic devices, implants and grafts, initial encounter: Secondary | ICD-10-CM

## 2020-10-23 NOTE — Patient Instructions (Signed)
Please restart the vaginal estrogen. Use one gram every night for one week, then change to 2 x a week. Try and leave the pessary out as much as possible in the next week. In one week, you can just take the pessary out at night. F/U in one month

## 2020-10-28 DIAGNOSIS — I83812 Varicose veins of left lower extremities with pain: Secondary | ICD-10-CM | POA: Diagnosis not present

## 2020-10-28 DIAGNOSIS — I8312 Varicose veins of left lower extremity with inflammation: Secondary | ICD-10-CM | POA: Diagnosis not present

## 2020-10-28 DIAGNOSIS — M7981 Nontraumatic hematoma of soft tissue: Secondary | ICD-10-CM | POA: Diagnosis not present

## 2020-11-24 ENCOUNTER — Ambulatory Visit: Payer: Medicare HMO | Admitting: Obstetrics and Gynecology

## 2020-11-24 NOTE — [facility] Notes (Deleted)
GYNECOLOGY  VISIT   HPI: 65 y.o.   Widowed White or Caucasian Not Hispanic or Latino  female   [phone] with No LMP recorded. Patient has had a hysterectomy.   here for pessary check.   GYNECOLOGIC HISTORY: No LMP recorded. Patient has had a hysterectomy. Contraception: Hysterectomy Menopausal hormone therapy: Estrace 0.1 mg vaginal cream        OB History    Gravida  2   Para  2   Term  2   Preterm      AB      Living  2     SAB      IAB      Ectopic      Multiple      Live Births                 Patient Active Problem List   Diagnosis Date Noted  . Pain due to onychomycosis of toenail of left foot 10/14/2020  . IDA (iron deficiency anemia) 03/11/2020  . Lower GI bleed 11/06/2019  . Anemia 11/06/2019  . Tinnitus of both ears 08/28/2019  . Pressure sensation in both ears 08/28/2019  . Bilateral carotid bruits 08/28/2019  . Osteoporosis 08/28/2019  . Muscle cramps 08/28/2019  . Compression fracture of L4 vertebra ([place]) 11/07/2018  . Low back pain 05/28/2018  . Insomnia 05/23/2017  . Multiple food allergies 05/19/2017  . Other malaise and fatigue 08/26/2013  . High cholesterol 08/23/2013  . Bladder prolapse, female, acquired 05/16/2013  . Cough 04/19/2012  . Urinary frequency 04/27/2011  . Hypokalemia 03/10/2011  . Neutropenia ([place]) 03/10/2011  . Arthritis 01/25/2011  . Atrophic vaginitis 01/25/2011  . Preventative health care 01/25/2011  . Diverticulosis 01/25/2011  . Strain of knee   . Seasonal and perennial allergic rhinitis   . Hypertension   . Chicken pox   . Mumps   . Measles   . Goiter     Past Medical History:  Diagnosis Date  . Allergy    seasonal  . Arthritis    left hip, low back  . Atrophic vaginitis 01/25/2011  . Bladder prolapse, female, acquired 05/16/2013  . Blood transfusion without reported diagnosis   . Chicken pox as a child  . Compression fracture of fourth lumbar vertebra ([place]) 04/25/2018  . Cough 04/19/2012  .  Diverticulosis   . 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 Gallstones   . GI bleed   . Goiter   . Hypertension   . Measles as a child  . Mumps as a child  . Neutropenia 03/10/2011  . Osteoporosis   . Other and unspecified hyperlipidemia 08/23/2013  . Other malaise and fatigue 08/26/2013  . Pharyngitis, acute 03/09/2011  . Urinary frequency 04/27/2011  . Vaginitis 04/27/2011    Past Surgical History:  Procedure Laterality Date  . BIOPSY THYROID     FNA, benign  . BREAST BIOPSY Left    FNA, benign  . CHOLECYSTECTOMY  1998  . COLONOSCOPY     more than 10 years ago. Michela Pitcher it was done at [facility]  . ERCP  1999  . ESOPHAGOGASTRODUODENOSCOPY  1999   with the ERCP as well for her gallbladder  . HERNIA REPAIR Right    right, inguinal  . in grown toenails removed     partial, b/l  . laser surgery on vericose veins     b/l  . NASAL SEPTUM SURGERY  2015  . TONSILLECTOMY  as a child  . VAGINAL  HYSTERECTOMY  1988   done for polapse  . VARICOSE VEIN SURGERY      Current Outpatient Medications  Medication Sig Dispense Refill  . Calcium Carbonate-Vit D-Min (CALCIUM 600+D PLUS MINERALS) 600-400 MG-UNIT TABS Take by mouth.    . Cholecalciferol (VITAMIN D3) 5000 UNITS CAPS Take 1 capsule by mouth daily.     . Coenzyme Q10 (COQ-10) 200 MG CAPS Take 1 tablet by mouth daily.     Marland Kitchen estradiol (ESTRACE) 0.1 MG/GM vaginal cream 1 gram vaginally every night for one week, then space to twice weekly 42.5 g 1  . Ferrous Sulfate (SLOW FE PO) Take 1 tablet by mouth daily.    Marland Kitchen loratadine (CLARITIN) 10 MG tablet Take 10 mg by mouth daily.    . metoprolol succinate (TOPROL-XL) 100 MG 24 hr tablet Take 1 tablet (100 mg total) by mouth daily. Take with or immediately following a meal. 90 tablet 2  . Multiple Vitamin (MULTIVITAMIN) tablet Take 1 tablet by mouth daily.    . Probiotic Product (PROBIOTIC DAILY PO) Take 1 tablet by mouth daily.     . Red Yeast Rice Extract (RED YEAST RICE PO) Take 1 capsule  by mouth daily.    Marland Kitchen triamcinolone (NASACORT) 55 MCG/ACT AERO nasal inhaler Place 1 spray into the nose daily.    Marland Kitchen triamterene-hydrochlorothiazide (MAXZIDE-25) 37.5-25 MG tablet Take 1 tablet by mouth daily. 90 tablet 2   No current facility-administered medications for this visit.     ALLERGIES: Lisinopril  Family History  Problem Relation Age of Onset  . Atrial fibrillation Mother   . Obesity Mother   . Diabetes Mother 42       Type 2  . Arthritis Mother        in right knee  . Heart disease Mother        atrial fibrillation, CHF  . Hernia Mother        never recovered from repair  . Hypertension Father   . Ulcers Father   . Pulmonary fibrosis Father   . Diabetes Maternal Grandmother        type 2?  . Heart attack Maternal Grandmother   . Hearing loss Maternal Grandmother        MI  . Parkinsonism Maternal Grandfather   . Stroke Paternal Grandmother   . Colon cancer Maternal Aunt   . Esophageal cancer Neg Hx     Social History   Socioeconomic History  . Marital status: Widowed    Spouse name: Not on file  . Number of children: 2  . Years of education: Not on file  . Highest education level: Not on file  Occupational History  . Occupation: NICU    Employer: [employer]  Tobacco Use  . Smoking status: Former Smoker    Packs/day: 1.00    Years: 5.00    Pack years: 5.00    Types: Cigarettes    Start date: 52    Quit date: 07/12/1976    Years since quitting: 44.4  . Smokeless tobacco: Never Used  Vaping Use  . Vaping Use: Never used  Substance and Sexual Activity  . Alcohol use: Yes    Comment: occasionally  . Drug use: No  . Sexual activity: Yes    Partners: Male    Comment: no dietary restrictions. but does minimize refined sugar  Other Topics Concern  . Not on file  Social History Narrative  . Not on file   Social Determinants of Health  Financial Resource Strain: Not on file  Food Insecurity: Not on file  Transportation Needs: Not on file   Physical Activity: Not on file  Stress: Not on file  Social Connections: Not on file  Intimate Partner Violence: Not on file    ROS  PHYSICAL EXAMINATION:    There were no vitals taken for this visit.    General appearance: alert, cooperative and appears stated age Neck: no adenopathy, supple, symmetrical, trachea midline and thyroid {CHL AMB PHY EX THYROID NORM [phone]::"normal to inspection and palpation"} Breasts: {Exam; breast:13139::"normal appearance, no masses or tenderness"} Abdomen: soft, non-tender; non distended, no masses,  no organomegaly  Pelvic: External genitalia:  no lesions              Urethra:  normal appearing urethra with no masses, tenderness or lesions              Bartholins and Skenes: normal                 Vagina: normal appearing vagina with normal color and discharge, no lesions              Cervix: {CHL AMB PHY EX CERVIX NORM DEFAULT:[phone]::"no lesions"}              Bimanual Exam:  Uterus:  {CHL AMB PHY EX UTERUS NORM [phone]::"normal size, contour, position, consistency, mobility, non-tender"}              Adnexa: {CHL AMB PHY EX ADNEXA NO MASS [phone]::"no mass, fullness, tenderness"}              Rectovaginal: {yes no:[phone]}.  Confirms.              Anus:  normal sphincter tone, no lesions  Chaperone was present for exam.  ASSESSMENT     PLAN    An After Visit Summary was printed and given to the patient.  *** minutes face to face time of which over 50% was spent in counseling.

## 2020-12-10 ENCOUNTER — Ambulatory Visit: Payer: Medicare HMO | Admitting: Obstetrics and Gynecology

## 2020-12-11 ENCOUNTER — Encounter: Payer: Self-pay | Admitting: Nurse Practitioner

## 2020-12-11 ENCOUNTER — Other Ambulatory Visit: Payer: Self-pay

## 2020-12-11 ENCOUNTER — Ambulatory Visit: Payer: Medicare HMO | Admitting: Nurse Practitioner

## 2020-12-11 VITALS — BP 112/80 | HR 72 | Resp 16 | Wt 155.0 lb

## 2020-12-11 DIAGNOSIS — N8110 Cystocele, unspecified: Secondary | ICD-10-CM

## 2020-12-11 DIAGNOSIS — Z4689 Encounter for fitting and adjustment of other specified devices: Secondary | ICD-10-CM

## 2020-12-11 NOTE — Patient Instructions (Signed)
Every thing looks normal today Continue vaginal estrogen as discussed previously with Dr. Talbert Nan F/U with Dr. Talbert Nan in 3 months

## 2020-12-11 NOTE — [facility] Notes (Signed)
GYNECOLOGY  VISIT  CC:  Pessary check  HPI: 65 y.o. [id number] Widowed White or Caucasian female here for pessary check. Has started using vaginal estrogen again. Inserts ring with support daily (size 5), removes at night. Denies discharge, itching or burning. Does not have the ring in today for her evaluation.  Last visit (10/23/2020), although asymptomatic, slight erosion was noted at [place] of vagina, pt had stopped using estrogen at that time. She also kept pessary out for a few days to allow healing.     GYNECOLOGIC HISTORY: No LMP recorded. Patient has had a hysterectomy. Contraception: hysterectomy Menopausal hormone therapy: estrace vaginal cream  Patient Active Problem List   Diagnosis Date Noted  . Pain due to onychomycosis of toenail of left foot 10/14/2020  . IDA (iron deficiency anemia) 03/11/2020  . Lower GI bleed 11/06/2019  . Anemia 11/06/2019  . Tinnitus of both ears 08/28/2019  . Pressure sensation in both ears 08/28/2019  . Bilateral carotid bruits 08/28/2019  . Osteoporosis 08/28/2019  . Muscle cramps 08/28/2019  . Compression fracture of L4 vertebra ([place]) 11/07/2018  . Low back pain 05/28/2018  . Insomnia 05/23/2017  . Multiple food allergies 05/19/2017  . Other malaise and fatigue 08/26/2013  . High cholesterol 08/23/2013  . Bladder prolapse, female, acquired 05/16/2013  . Cough 04/19/2012  . Urinary frequency 04/27/2011  . Hypokalemia 03/10/2011  . Neutropenia ([place]) 03/10/2011  . Arthritis 01/25/2011  . Atrophic vaginitis 01/25/2011  . Preventative health care 01/25/2011  . Diverticulosis 01/25/2011  . Strain of knee   . Seasonal and perennial allergic rhinitis   . Hypertension   . Chicken pox   . Mumps   . Measles   . Goiter     Past Medical History:  Diagnosis Date  . Allergy    seasonal  . Arthritis    left hip, low back  . Atrophic vaginitis 01/25/2011  . Bladder prolapse, female, acquired 05/16/2013  . Blood transfusion without reported  diagnosis   . Chicken pox as a child  . Compression fracture of fourth lumbar vertebra ([place]) 04/25/2018  . Cough 04/19/2012  . Diverticulosis   . 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 Gallstones   . GI bleed   . Goiter   . Hypertension   . Measles as a child  . Mumps as a child  . Neutropenia 03/10/2011  . Osteoporosis   . Other and unspecified hyperlipidemia 08/23/2013  . Other malaise and fatigue 08/26/2013  . Pharyngitis, acute 03/09/2011  . Urinary frequency 04/27/2011  . Vaginitis 04/27/2011    Past Surgical History:  Procedure Laterality Date  . BIOPSY THYROID     FNA, benign  . BREAST BIOPSY Left    FNA, benign  . CHOLECYSTECTOMY  1998  . COLONOSCOPY     more than 10 years ago. Michela Pitcher it was done at [facility]  . ERCP  1999  . ESOPHAGOGASTRODUODENOSCOPY  1999   with the ERCP as well for her gallbladder  . HERNIA REPAIR Right    right, inguinal  . in grown toenails removed     partial, b/l  . laser surgery on vericose veins     b/l  . NASAL SEPTUM SURGERY  2015  . TONSILLECTOMY  as a child  . VAGINAL HYSTERECTOMY  1988   done for polapse  . VARICOSE VEIN SURGERY      MEDS:   Current Outpatient Medications on File Prior to Visit  Medication Sig Dispense Refill  . Calcium Carbonate-Vit  D-Min (CALCIUM 600+D PLUS MINERALS) 600-400 MG-UNIT TABS Take by mouth.    . Cholecalciferol (VITAMIN D3) 5000 UNITS CAPS Take 1 capsule by mouth daily.     . Coenzyme Q10 (COQ-10) 200 MG CAPS Take 1 tablet by mouth daily.     Marland Kitchen estradiol (ESTRACE) 0.1 MG/GM vaginal cream 1 gram vaginally every night for one week, then space to twice weekly 42.5 g 1  . Ferrous Sulfate (SLOW FE PO) Take 1 tablet by mouth daily.    Marland Kitchen loratadine (CLARITIN) 10 MG tablet Take 10 mg by mouth daily.    . metoprolol succinate (TOPROL-XL) 100 MG 24 hr tablet Take 1 tablet (100 mg total) by mouth daily. Take with or immediately following a meal. 90 tablet 2  . Multiple Vitamin (MULTIVITAMIN) tablet Take 1  tablet by mouth daily.    . Probiotic Product (PROBIOTIC DAILY PO) Take 1 tablet by mouth daily.     . Red Yeast Rice Extract (RED YEAST RICE PO) Take 1 capsule by mouth daily.    Marland Kitchen triamterene-hydrochlorothiazide (MAXZIDE-25) 37.5-25 MG tablet Take 1 tablet by mouth daily. 90 tablet 2   No current facility-administered medications on file prior to visit.    ALLERGIES: Lisinopril  Family History  Problem Relation Age of Onset  . Atrial fibrillation Mother   . Obesity Mother   . Diabetes Mother 76       Type 2  . Arthritis Mother        in right knee  . Heart disease Mother        atrial fibrillation, CHF  . Hernia Mother        never recovered from repair  . Hypertension Father   . Ulcers Father   . Pulmonary fibrosis Father   . Diabetes Maternal Grandmother        type 2?  . Heart attack Maternal Grandmother   . Hearing loss Maternal Grandmother        MI  . Parkinsonism Maternal Grandfather   . Stroke Paternal Grandmother   . Colon cancer Maternal Aunt   . Esophageal cancer Neg Hx      Review of Systems  Constitutional: Negative.   HENT: Negative.   Eyes: Negative.   Respiratory: Negative.   Cardiovascular: Negative.   Gastrointestinal: Negative.   Endocrine: Negative.   Genitourinary: Negative.   Musculoskeletal: Negative.   Skin: Negative.   Allergic/Immunologic: Negative.   Neurological: Negative.   Hematological: Negative.   Psychiatric/Behavioral: Negative.     PHYSICAL EXAMINATION:    BP 112/80   Pulse 72   Resp 16   Wt 155 lb (70.3 kg)   BMI 24.28 kg/m     General appearance: alert, cooperative, no acute distress  Lymph:  no inguinal LAD noted  Pelvic: External genitalia:  no lesions              Urethra:  normal appearing urethra with no masses, tenderness or lesions              Bartholins and Skenes: normal                 Vagina: normal appearing vagina with pelvic organ prolapse. No skin breakdown, normal appearing discharge, no  tenderness with exam              Cervix:absent  Assessment: Pessary maintenance- continue using as directed per Dr. Talbert Nan Continue vaginal estrogen F/U 3 month for recheck with Dr. Talbert Nan

## 2020-12-25 ENCOUNTER — Ambulatory Visit: Payer: Medicare HMO

## 2021-01-04 ENCOUNTER — Other Ambulatory Visit: Payer: Self-pay | Admitting: Obstetrics and Gynecology

## 2021-01-05 NOTE — Telephone Encounter (Signed)
Please change the patient's appointment in 9/22 to a breast and pelvic exam with a pessary check. Please also ask the patient to schedule her mammogram, she is overdue.  Thanks.

## 2021-01-05 NOTE — Telephone Encounter (Signed)
Staff message sent to appointment to change patient aware.  Also informed to schedule mammogram.

## 2021-01-05 NOTE — Telephone Encounter (Signed)
Dr.Jertson I don't see where patient has had annual exam. The last one I could find was in 2015. Lots of pessary check visits.

## 2021-01-15 ENCOUNTER — Other Ambulatory Visit (HOSPITAL_BASED_OUTPATIENT_CLINIC_OR_DEPARTMENT_OTHER): Payer: Self-pay | Admitting: Family Medicine

## 2021-01-15 DIAGNOSIS — Z1231 Encounter for screening mammogram for malignant neoplasm of breast: Secondary | ICD-10-CM

## 2021-01-29 DIAGNOSIS — I83893 Varicose veins of bilateral lower extremities with other complications: Secondary | ICD-10-CM | POA: Diagnosis not present

## 2021-01-29 DIAGNOSIS — I83813 Varicose veins of bilateral lower extremities with pain: Secondary | ICD-10-CM | POA: Diagnosis not present

## 2021-02-17 IMAGING — US US CAROTID DUPLEX BILAT
2 series · 13 of 24 positions shown · non-contrast
Comparison: None.

CLINICAL DATA: Asymptomatic carotid bruits

EXAM:
BILATERAL CAROTID DUPLEX ULTRASOUND
TECHNIQUE: Gray scale imaging, color Doppler and duplex ultrasound were
performed of bilateral carotid and vertebral arteries in the neck.

[Series 1: us carotid duplex bilat · 12 of 49 slices shown (1 of 2)]
[im 1/49]
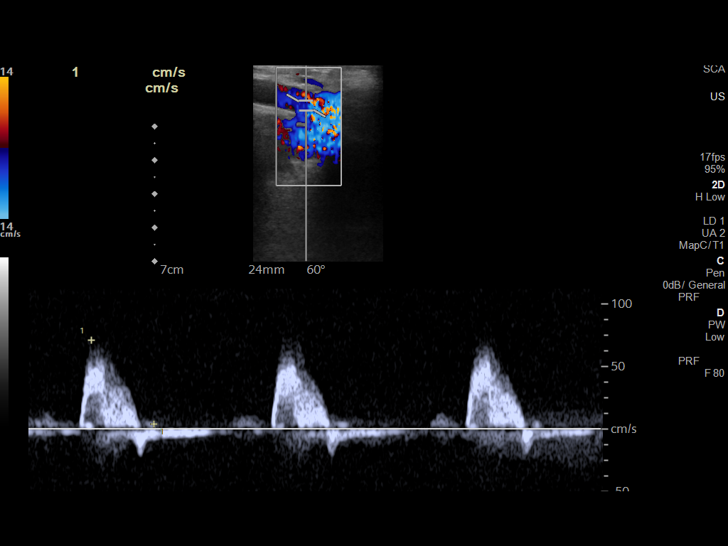
[im 5/49]
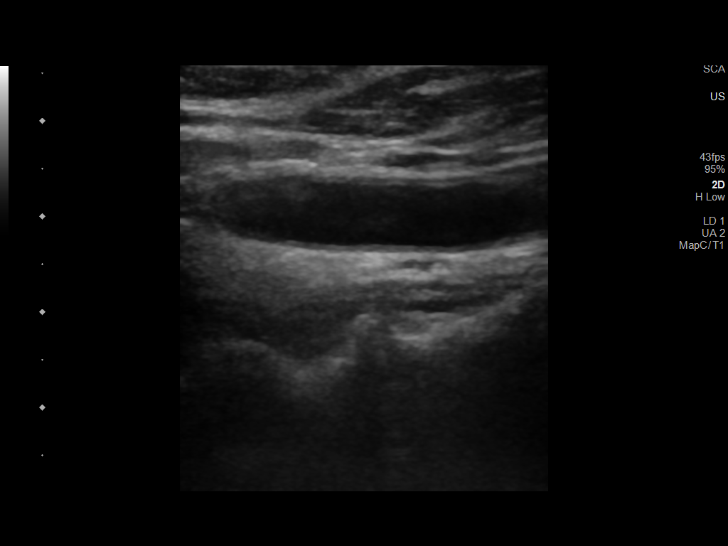
[im 9/49]
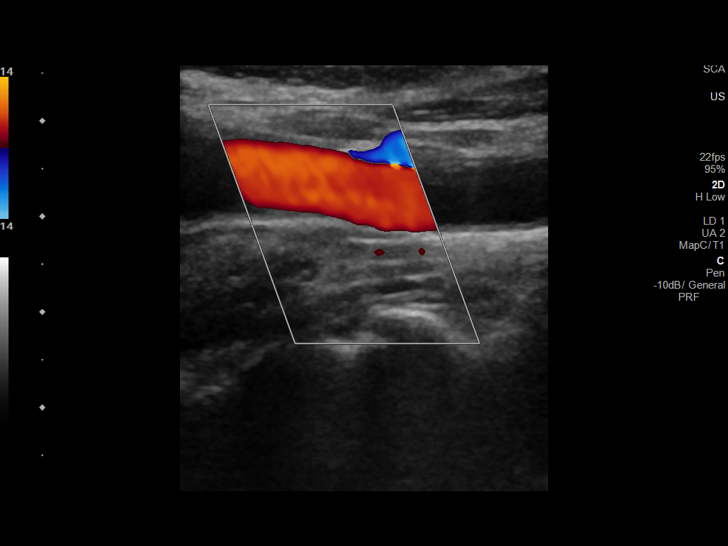
[im 14/49]
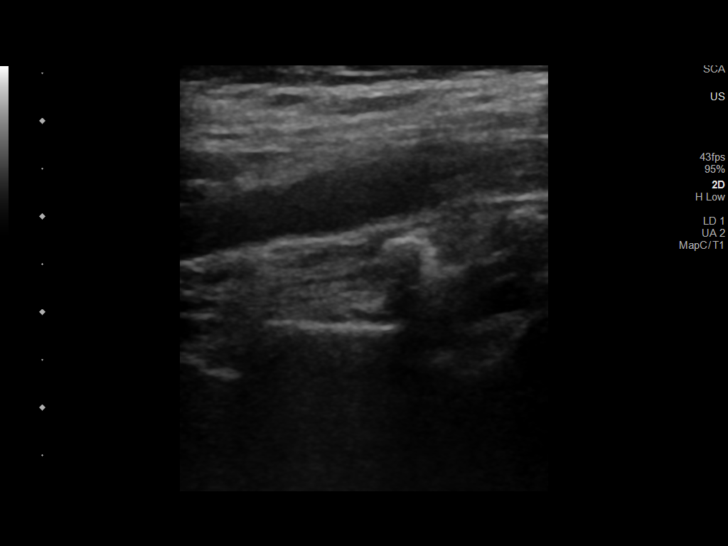
[im 18/49]
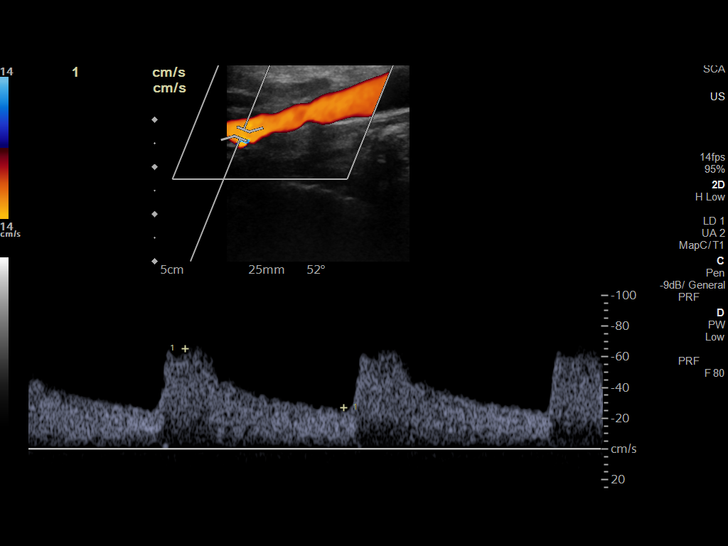
[im 22/49]
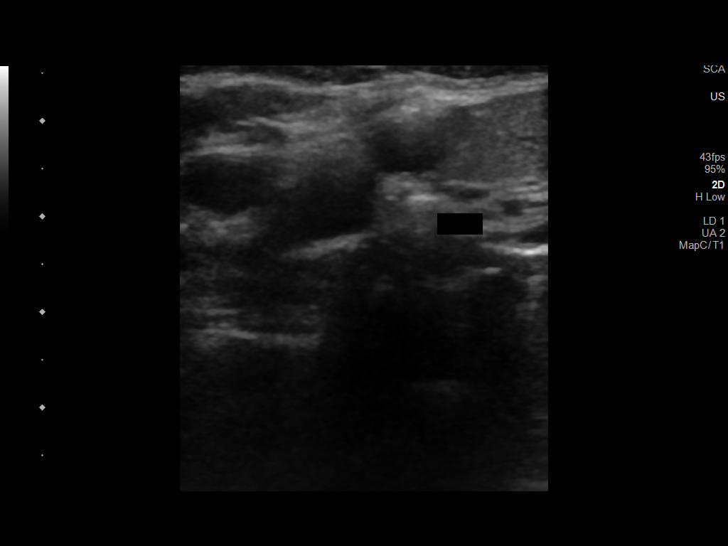
[im 27/49]
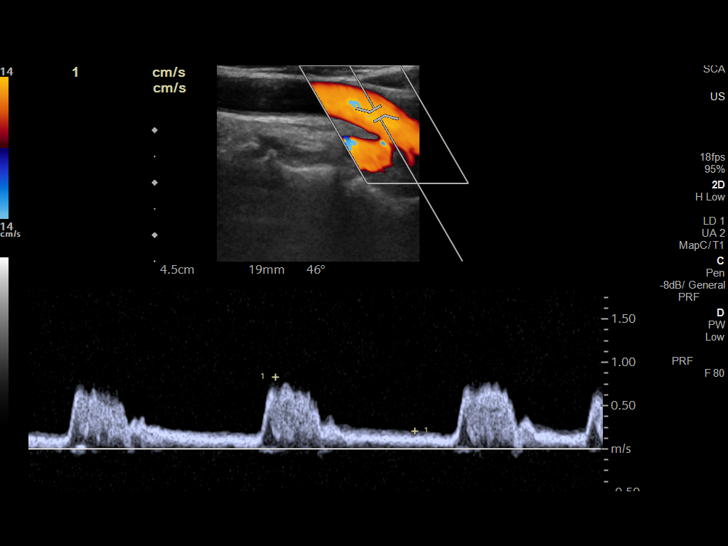
[im 29/49]
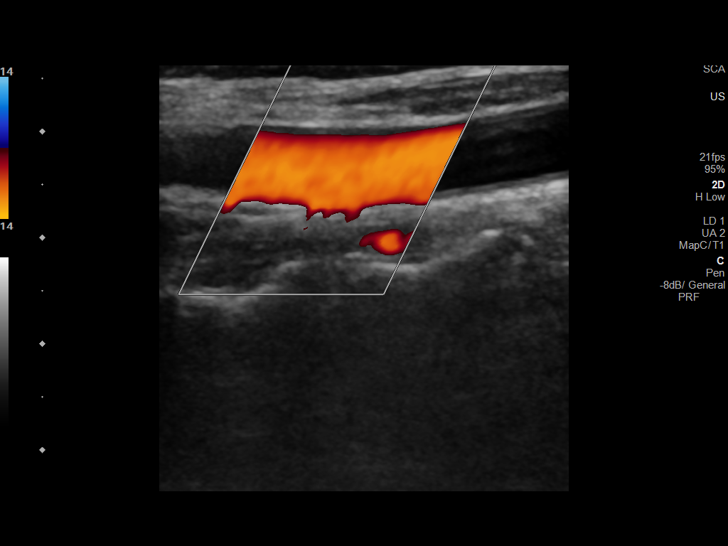
[im 33/49]
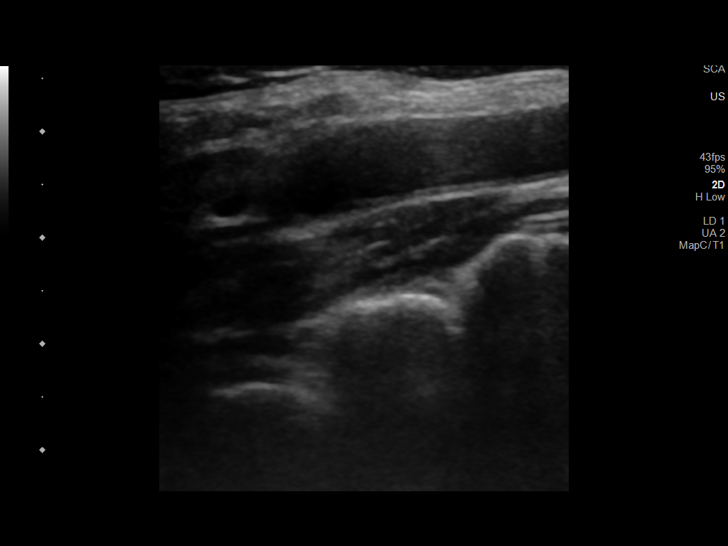
[im 38/49]
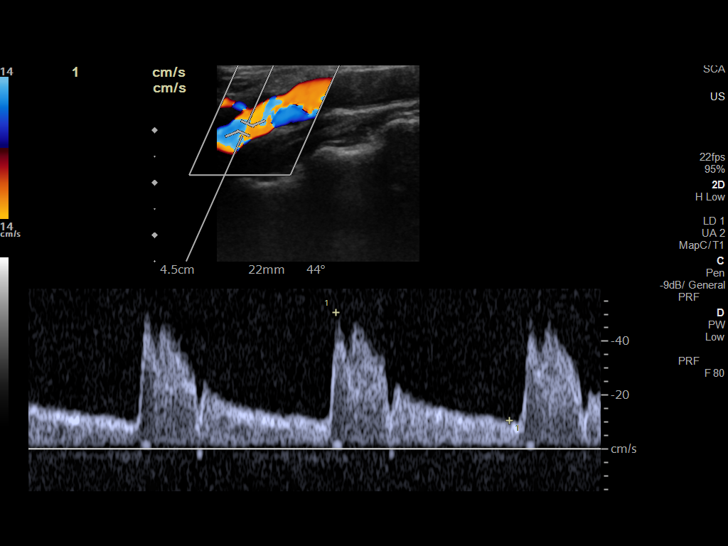
[im 42/49]
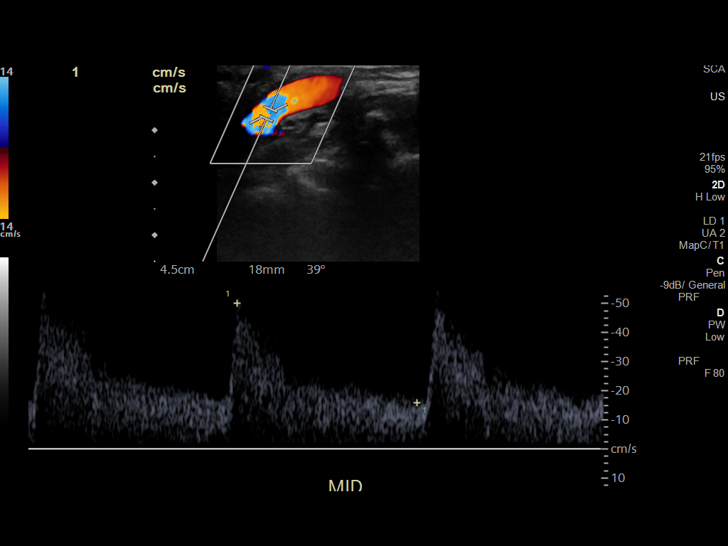
[im 46/49]
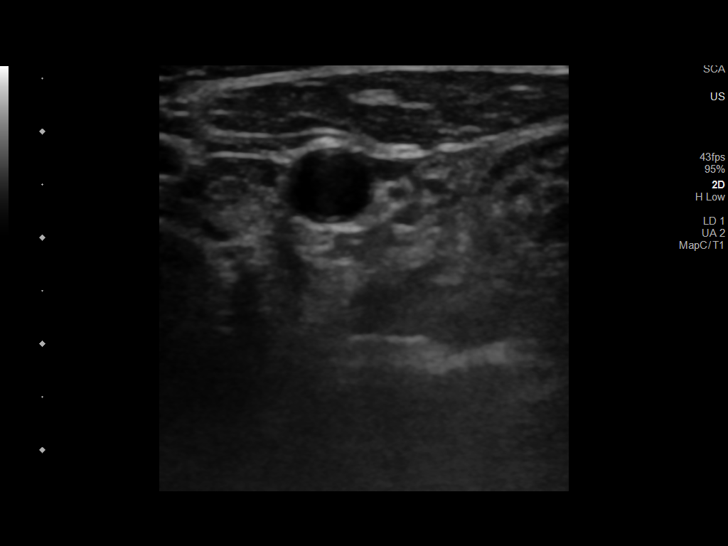

[Series 2: us carotid duplex bilat · 1 of 1 slices shown (2 of 2)]
[im 1/1]
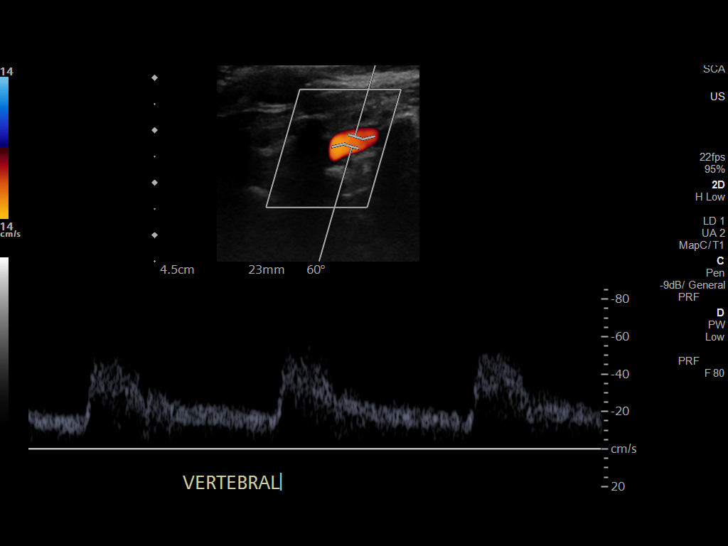

[13 of 24 positions shown; findings below may reference images not displayed]

FINDINGS: Criteria: Quantification of carotid stenosis is based on velocity
parameters that correlate the residual internal carotid diameter
with NASCET-based stenosis levels, using the diameter of the distal
internal carotid lumen as the denominator for stenosis measurement.

The following velocity measurements were obtained:

RIGHT

ICA: 65/26 cm/sec

CCA: 63/15 cm/sec

SYSTOLIC ICA/CCA RATIO:

ECA: 56 cm/sec

LEFT

ICA: 50/16 cm/sec

CCA: 83/21 cm/sec

SYSTOLIC ICA/CCA RATIO:

ECA: 51 cm/sec

RIGHT CAROTID ARTERY: Minor intimal thickening without significant
atherosclerosis. No hemodynamically significant right ICA stenosis,
velocity elevation, or turbulent flow. Degree of narrowing less than
50% by ultrasound criteria.

RIGHT VERTEBRAL ARTERY:  Antegrade

LEFT CAROTID ARTERY: Similar minor intimal thickening without
significant atherosclerosis. No hemodynamically significant left ICA
stenosis, velocity elevation, or turbulent flow. Degree of narrowing
also estimated at less than 50%

LEFT VERTEBRAL ARTERY:  Antegrade

Upper extremity blood pressures: RIGHT: 164/86 LEFT: 173/87
IMPRESSION: Minor carotid intimal thickening without significant
atherosclerosis. No hemodynamically significant ICA stenosis. Degree
of narrowing less than 50% bilaterally by ultrasound criteria.

Patent antegrade vertebral flow bilaterally

## 2021-02-19 DIAGNOSIS — I83893 Varicose veins of bilateral lower extremities with other complications: Secondary | ICD-10-CM | POA: Diagnosis not present

## 2021-02-19 DIAGNOSIS — I8311 Varicose veins of right lower extremity with inflammation: Secondary | ICD-10-CM | POA: Diagnosis not present

## 2021-02-19 DIAGNOSIS — I8312 Varicose veins of left lower extremity with inflammation: Secondary | ICD-10-CM | POA: Diagnosis not present

## 2021-03-02 ENCOUNTER — Encounter (HOSPITAL_BASED_OUTPATIENT_CLINIC_OR_DEPARTMENT_OTHER): Payer: Self-pay

## 2021-03-02 ENCOUNTER — Ambulatory Visit (INDEPENDENT_AMBULATORY_CARE_PROVIDER_SITE_OTHER): Payer: Medicare HMO | Admitting: Family Medicine

## 2021-03-02 ENCOUNTER — Ambulatory Visit (HOSPITAL_BASED_OUTPATIENT_CLINIC_OR_DEPARTMENT_OTHER)
Admission: RE | Admit: 2021-03-02 | Discharge: 2021-03-02 | Disposition: A | Discharge status: Home / Self Care | Payer: Medicare HMO | Source: Ambulatory Visit | Attending: Family Medicine | Admitting: Family Medicine

## 2021-03-02 ENCOUNTER — Other Ambulatory Visit: Payer: Self-pay

## 2021-03-02 ENCOUNTER — Encounter: Payer: Self-pay | Admitting: Family Medicine

## 2021-03-02 VITALS — BP 140/96 | HR 76 | Temp 97.8°F | Resp 16 | Wt 157.2 lb

## 2021-03-02 DIAGNOSIS — D709 Neutropenia, unspecified: Secondary | ICD-10-CM

## 2021-03-02 DIAGNOSIS — M810 Age-related osteoporosis without current pathological fracture: Secondary | ICD-10-CM

## 2021-03-02 DIAGNOSIS — Z0000 Encounter for general adult medical examination without abnormal findings: Secondary | ICD-10-CM

## 2021-03-02 DIAGNOSIS — M5441 Lumbago with sciatica, right side: Secondary | ICD-10-CM

## 2021-03-02 DIAGNOSIS — E7800 Pure hypercholesterolemia, unspecified: Secondary | ICD-10-CM | POA: Diagnosis not present

## 2021-03-02 DIAGNOSIS — M25552 Pain in left hip: Secondary | ICD-10-CM | POA: Insufficient documentation

## 2021-03-02 DIAGNOSIS — I10 Essential (primary) hypertension: Secondary | ICD-10-CM

## 2021-03-02 DIAGNOSIS — Z1159 Encounter for screening for other viral diseases: Secondary | ICD-10-CM | POA: Diagnosis not present

## 2021-03-02 DIAGNOSIS — M5442 Lumbago with sciatica, left side: Secondary | ICD-10-CM

## 2021-03-02 DIAGNOSIS — Z1231 Encounter for screening mammogram for malignant neoplasm of breast: Secondary | ICD-10-CM | POA: Diagnosis not present

## 2021-03-02 NOTE — Progress Notes (Signed)
Subjective:   By signing my name below, I, Zite Okoli, attest that this documentation has been prepared under the direction and in the presence of Mosie Lukes, MD. 03/02/2021   Patient ID: Ann Barton, female    DOB: [birth year], 65 y.o.   MRN: [id number]  No chief complaint on file.   HPI Patient is in today for a medicare well visit  She mentions she has been having pain in her left hip for the past 2 months  that may be caused by arthritis. It does not radiate down to her extremities but sometimes spreads to her lower back. She also mentions that after exercising, when she sits down, the pain is worsened.  She also mentions there is more edema in the left ankle than the right ankle and is worse at night.  She has been experiencing dizziness for a while when she tries to stand up after bending over. She notes it can get worse and  describes it as a "whooshing" feeling. She denies tinnitus, falls and syncope.  She has 4 Pfizer Covid-19 vaccines at this time. She has gotten the 1st dose of the shingles vaccine. She is willing to get the Hepatitis C screening.  She is managing a healthy diet. She exercises regularly.  She is UTD on vision care.  There has been no recent changes in her family history. Her mother-in-law died of pneumonia in August 17, 2022.  Past Medical History:  Diagnosis Date   Allergy    seasonal   Arthritis    left hip, low back   Atrophic vaginitis 01/25/2011   Bladder prolapse, female, acquired 05/16/2013   Blood transfusion without reported diagnosis    Chicken pox as a child   Compression fracture of fourth lumbar vertebra ([place]) 04/25/2018   Cough 04/19/2012   Diverticulosis    Female sexual dysfunction 01/25/2011   Gallstones    GI bleed    Goiter    Hypertension    Measles as a child   Mumps as a child   Neutropenia 03/10/2011   Osteoporosis    Other and unspecified hyperlipidemia 08/23/2013   Other malaise and fatigue 08/26/2013   Pharyngitis, acute  03/09/2011   Urinary frequency 04/27/2011   Vaginitis 04/27/2011    Past Surgical History:  Procedure Laterality Date   BIOPSY THYROID     FNA, benign   BREAST BIOPSY Left    FNA, benign   BREAST EXCISIONAL BIOPSY Left    CHOLECYSTECTOMY  1998   COLONOSCOPY     more than 10 years ago. Michela Pitcher it was done at [facility]   ERCP  [place]   with the ERCP as well for her gallbladder   HERNIA REPAIR Right    right, inguinal   in grown toenails removed     partial, b/l   laser surgery on vericose veins     b/l   NASAL SEPTUM SURGERY  2015   TONSILLECTOMY  as a child   [place]   done for polapse   VARICOSE VEIN SURGERY      Family History  Problem Relation Age of Onset   Atrial fibrillation Mother    Obesity Mother    Diabetes Mother 19       Type 2   Arthritis Mother        in right knee   Heart disease Mother        atrial fibrillation, CHF   Hernia  Mother        never recovered from repair   Hypertension Father    Ulcers Father    Pulmonary fibrosis Father    Diabetes Maternal Grandmother        type 2?   Heart attack Maternal Grandmother    Hearing loss Maternal Grandmother        MI   Parkinsonism Maternal Grandfather    Stroke Paternal Grandmother    Colon cancer Maternal Aunt    Esophageal cancer Neg Hx     Social History   Socioeconomic History   Marital status: Widowed    Spouse name: Not on file   Number of children: 2   Years of education: Not on file   Highest education level: Not on file  Occupational History   Occupation: NICU    Employer: [employer]  Tobacco Use   Smoking status: Former    Packs/day: 1.00    Years: 5.00    Pack years: 5.00    Types: Cigarettes    Start date: 37    Quit date: 07/12/1976    Years since quitting: 44.6   Smokeless tobacco: Never  Vaping Use   Vaping Use: Never used  Substance and Sexual Activity   Alcohol use: Yes    Comment: occasionally   Drug use:  No   Sexual activity: Not Currently    Partners: Male    Birth control/protection: Surgical    Comment: hysterectomy  Other Topics Concern   Not on file  Social History Narrative   Not on file   Social Determinants of Health   Financial Resource Strain: Not on file  Food Insecurity: Not on file  Transportation Needs: Not on file  Physical Activity: Not on file  Stress: Not on file  Social Connections: Not on file  Intimate Partner Violence: Not on file    Outpatient Medications Prior to Visit  Medication Sig Dispense Refill   Calcium Carbonate-Vit D-Min (CALCIUM 600+D PLUS MINERALS) 600-400 MG-UNIT TABS Take by mouth.     Cholecalciferol (VITAMIN D3) 5000 UNITS CAPS Take 1 capsule by mouth daily.      Coenzyme Q10 (COQ-10) 200 MG CAPS Take 1 tablet by mouth daily.      estradiol (ESTRACE) 0.1 MG/GM vaginal cream INSERT 1 GRAM VAGINALLY EVERY NIGHT FOR ONE WEEK, THEN SPACE TO TWICE WEEKLY 42.5 g 1   Ferrous Sulfate (SLOW FE PO) Take 1 tablet by mouth daily.     loratadine (CLARITIN) 10 MG tablet Take 10 mg by mouth daily.     metoprolol succinate (TOPROL-XL) 100 MG 24 hr tablet Take 1 tablet (100 mg total) by mouth daily. Take with or immediately following a meal. 90 tablet 2   Multiple Vitamin (MULTIVITAMIN) tablet Take 1 tablet by mouth daily.     Probiotic Product (PROBIOTIC DAILY PO) Take 1 tablet by mouth daily.      Red Yeast Rice Extract (RED YEAST RICE PO) Take 1 capsule by mouth daily.     triamterene-hydrochlorothiazide (MAXZIDE-25) 37.5-25 MG tablet Take 1 tablet by mouth daily. 90 tablet 2   No facility-administered medications prior to visit.    Allergies  Allergen Reactions   Lisinopril Cough    Review of Systems  Constitutional:  Negative for fever and malaise/fatigue.  HENT:  Negative for congestion.   Eyes:  Negative for redness.  Respiratory:  Negative for shortness of breath.   Cardiovascular:  Positive for leg swelling (bilateral). Negative for chest  pain and  palpitations.  Gastrointestinal:  Negative for abdominal pain, blood in stool and nausea.  Genitourinary:  Negative for dysuria and frequency.  Musculoskeletal:  Positive for joint pain (left hip). Negative for falls.  Skin:  Negative for rash.  Neurological:  Positive for dizziness (after bending). Negative for loss of consciousness and headaches.  Endo/Heme/Allergies:  Negative for polydipsia.  Psychiatric/Behavioral:  Negative for depression. The patient is not nervous/anxious.       Objective:    Physical Exam Constitutional:      General: She is not in acute distress.    Appearance: She is well-developed.  HENT:     Head: Normocephalic and atraumatic.  Eyes:     Conjunctiva/sclera: Conjunctivae normal.     Comments: No nystagmus  Neck:     Thyroid: No thyromegaly.  Cardiovascular:     Rate and Rhythm: Normal rate and regular rhythm.     Heart sounds: Normal heart sounds. No murmur heard. Pulmonary:     Effort: Pulmonary effort is normal. No respiratory distress.     Breath sounds: Normal breath sounds.  Abdominal:     General: Bowel sounds are normal. There is no distension.     Palpations: Abdomen is soft. There is no mass.     Tenderness: There is no abdominal tenderness.  Musculoskeletal:     Cervical back: Neck supple.     Comments: 5/5 strength in upper and lower extremities  Lymphadenopathy:     Cervical: No cervical adenopathy.  Skin:    General: Skin is warm and dry.  Neurological:     Mental Status: She is alert and oriented to person, place, and time.     Deep Tendon Reflexes:     Reflex Scores:      Patellar reflexes are 2+ on the right side and 2+ on the left side. Psychiatric:        Behavior: Behavior normal.    BP (!) 140/96   Pulse 76   Temp 97.8 F (36.6 C)   Resp 16   Wt 157 lb 3.2 oz (71.3 kg)   SpO2 96%   BMI 24.62 kg/m  Wt Readings from Last 3 Encounters:  03/02/21 157 lb 3.2 oz (71.3 kg)  12/11/20 155 lb (70.3 kg)   10/23/20 156 lb (70.8 kg)    Diabetic Foot Exam - Simple   No data filed    Lab Results  Component Value Date   WBC 6.1 03/03/2021   HGB 12.5 03/03/2021   HCT 37.6 03/03/2021   PLT 220.0 03/03/2021   GLUCOSE 86 06/26/2020   CHOL 170 06/26/2020   TRIG 77.0 06/26/2020   HDL 54.90 06/26/2020   LDLCALC 100 (H) 06/26/2020   ALT 10 06/26/2020   AST 11 06/26/2020   NA 135 06/26/2020   K 4.2 06/26/2020   CL 98 06/26/2020   CREATININE 0.65 06/26/2020   BUN 17 06/26/2020   CO2 30 06/26/2020   TSH 0.63 06/26/2020    Lab Results  Component Value Date   TSH 0.63 06/26/2020   Lab Results  Component Value Date   WBC 6.1 03/03/2021   HGB 12.5 03/03/2021   HCT 37.6 03/03/2021   MCV 88.9 03/03/2021   PLT 220.0 03/03/2021   Lab Results  Component Value Date   NA 135 06/26/2020   K 4.2 06/26/2020   CO2 30 06/26/2020   GLUCOSE 86 06/26/2020   BUN 17 06/26/2020   CREATININE 0.65 06/26/2020   BILITOT 0.4 06/26/2020   ALKPHOS  55 06/26/2020   AST 11 06/26/2020   ALT 10 06/26/2020   PROT 6.9 06/26/2020   ALBUMIN 4.3 06/26/2020   CALCIUM 9.5 06/26/2020   ANIONGAP 5 03/11/2020   GFR 92.60 06/26/2020   Lab Results  Component Value Date   CHOL 170 06/26/2020   Lab Results  Component Value Date   HDL 54.90 06/26/2020   Lab Results  Component Value Date   LDLCALC 100 (H) 06/26/2020   Lab Results  Component Value Date   TRIG 77.0 06/26/2020   Lab Results  Component Value Date   CHOLHDL 3 06/26/2020   No results found for: HGBA1C     Assessment & Plan:   Problem List Items Addressed This Visit     Hypertension    Mildly elevated , no changes to meds. She will monitor home numbers at rest and let us know if her blood pressure runs above 140/90. Encouraged heart healthy diet such as the DASH diet and exercise as tolerated.       Relevant Orders   CBC   Comprehensive metabolic panel   TSH   High cholesterol - Primary     encouraged heart healthy diet, avoid  trans fats, minimize simple carbs and saturated fats. Increase exercise as tolerated      Relevant Orders   Lipid panel   Welcome to Medicare preventive visit    Patient denies any difficulties at home. No trouble with ADLs, depression or falls. See EMR for functional status screen and depression screen. No recent changes to vision or hearing. Is UTD with immunizations. Is UTD with screening. Discussed Advanced Directives. Encouraged heart healthy diet, exercise as tolerated and adequate sleep. See patient's problem list for health risk factors to monitor. Labs ordered including hep C screen and reviewed. See AVS for preventative care recommendations Patient encouraged to maintain heart healthy diet, regular exercise, adequate sleep. Consider daily probiotics. Take medications as prescribed      Neutropenia (HCC)   Relevant Orders   CBC   Low back pain   Relevant Orders   CBC   Comprehensive metabolic panel   Osteoporosis    Encouraged to get adequate exercise, calcium and vitamin d intake      Left hip pain    A couple of months. No injury, xray ordered and patient referred to ortho for further consideration      Relevant Orders   DG Hip Unilat W OR W/O Pelvis 2-3 Views Left (Completed)   Ambulatory referral to Orthopedic Surgery   Other Visit Diagnoses     Encounter for hepatitis C screening test for low risk patient       Relevant Orders   Hepatitis C Antibody        No orders of the defined types were placed in this encounter.   I,Zite Okoli,acting as a Education administrator for Penni Homans, MD.,have documented all relevant documentation on the behalf of Penni Homans, MD,as directed by  Penni Homans, MD while in the presence of Penni Homans, MD.   I, Mosie Lukes, MD., personally preformed the services described in this documentation.  All medical record entries made by the scribe were at my direction and in my presence.  I have reviewed the chart and discharge instructions (if  applicable) and agree that the record reflects my personal performance and is accurate and complete. 03/02/2021

## 2021-03-02 NOTE — Assessment & Plan Note (Addendum)
A couple of months. No injury, xray ordered and patient referred to ortho for further consideration

## 2021-03-02 NOTE — Patient Instructions (Signed)
  Ann Barton , Thank you for taking time to come for your Medicare Wellness Visit. I appreciate your ongoing commitment to your health goals. Please review the following plan we discussed and let me know if I can assist you in the future.   These are the goals we discussed:  Goals   None     This is a list of the screening recommended for you and due dates:  Health Maintenance  Topic Date Due   Hepatitis C Screening: USPSTF Recommendation to screen - Ages 55-79 yo.  Never done   Mammogram  05/25/2020   Flu Shot  02/09/2021   Zoster (Shingles) Vaccine (2 of 2) 03/26/2021   Tetanus Vaccine  05/15/2023   Colon Cancer Screening  01/21/2030   DEXA scan (bone density measurement)  Completed   COVID-19 Vaccine  Completed   HIV Screening  Completed   Pneumonia vaccines  Completed   HPV Vaccine  Aged Out

## 2021-03-02 NOTE — Assessment & Plan Note (Addendum)
Mildly elevated , no changes to meds. She will monitor home numbers at rest and let us know if her blood pressure runs above 140/90. Encouraged heart healthy diet such as the DASH diet and exercise as tolerated.

## 2021-03-03 ENCOUNTER — Other Ambulatory Visit: Payer: Self-pay

## 2021-03-03 ENCOUNTER — Other Ambulatory Visit (INDEPENDENT_AMBULATORY_CARE_PROVIDER_SITE_OTHER): Payer: Medicare HMO

## 2021-03-03 DIAGNOSIS — D500 Iron deficiency anemia secondary to blood loss (chronic): Secondary | ICD-10-CM | POA: Diagnosis not present

## 2021-03-03 LAB — RETICULOCYTES
ABS Retic: 54470 cells/uL (ref 20000–80000)
Retic Ct Pct: 1.3 %

## 2021-03-03 NOTE — Addendum Note (Signed)
Addended by: Legrand Rams on: 03/03/2021 01:24 PM   Modules accepted: Orders

## 2021-03-03 NOTE — Addendum Note (Signed)
Addended by: Legrand Rams on: 03/03/2021 01:25 PM   Modules accepted: Orders

## 2021-03-04 LAB — CBC WITH DIFFERENTIAL/PLATELET
Basophils Absolute: 0.1 10*3/uL (ref 0.0–0.1)
Basophils Relative: 0.9 % (ref 0.0–3.0)
Eosinophils Absolute: 0.2 10*3/uL (ref 0.0–0.7)
Eosinophils Relative: 3.9 % (ref 0.0–5.0)
HCT: 37.6 % (ref 36.0–46.0)
Hemoglobin: 12.5 g/dL (ref 12.0–15.0)
Lymphocytes Relative: 21.1 % (ref 12.0–46.0)
Lymphs Abs: 1.3 10*3/uL (ref 0.7–4.0)
MCHC: 33.3 g/dL (ref 30.0–36.0)
MCV: 88.9 fl (ref 78.0–100.0)
Monocytes Absolute: 0.4 10*3/uL (ref 0.1–1.0)
Monocytes Relative: 6.2 % (ref 3.0–12.0)
Neutro Abs: 4.1 10*3/uL (ref 1.4–7.7)
Neutrophils Relative %: 67.9 % (ref 43.0–77.0)
Platelets: 220 10*3/uL (ref 150.0–400.0)
RBC: 4.23 Mil/uL (ref 3.87–5.11)
RDW: 13.4 % (ref 11.5–15.5)
WBC: 6.1 10*3/uL (ref 4.0–10.5)

## 2021-03-04 LAB — IBC + FERRITIN
Ferritin: 26.2 ng/mL (ref 10.0–291.0)
Iron: 69 ug/dL (ref 42–145)
Saturation Ratios: 26.6 % (ref 20.0–50.0)
TIBC: 259 ug/dL (ref 250.0–450.0)
Transferrin: 185 mg/dL — ABNORMAL LOW (ref 212.0–360.0)

## 2021-03-04 NOTE — Assessment & Plan Note (Addendum)
Patient denies any difficulties at home. No trouble with ADLs, depression or falls. See EMR for functional status screen and depression screen. No recent changes to vision or hearing. Is UTD with immunizations. Is UTD with screening. Discussed Advanced Directives. Encouraged heart healthy diet, exercise as tolerated and adequate sleep. See patient's problem list for health risk factors to monitor. Labs ordered including hep C screen and reviewed. See AVS for preventative care recommendations Patient encouraged to maintain heart healthy diet, regular exercise, adequate sleep. Consider daily probiotics. Take medications as prescribed

## 2021-03-04 NOTE — Assessment & Plan Note (Signed)
Encouraged to get adequate exercise, calcium and vitamin d intake 

## 2021-03-04 NOTE — Assessment & Plan Note (Signed)
encouraged heart healthy diet, avoid trans fats, minimize simple carbs and saturated fats. Increase exercise as tolerated 

## 2021-03-11 NOTE — Progress Notes (Signed)
65 y.o. G37P2002 Widowed White or Caucasian Not Hispanic or Latino female here for breast & pelvic with pessary check.  H/O hysterectomy. She has a #4 ring pessary with support for a cystocele. No vaginal bleeding. Takes the pessary out at night and replaces it in the am.  Uses estrogen cream 2 x a week at hs. Not sexually active. No bowel or bladder c/o.    No LMP recorded. Patient has had a hysterectomy.          Sexually active: No.  The current method of family planning is status post hysterectomy.    Exercising: Yes.     walking Smoker:  no  Health Maintenance: Pap:  04-27-19 neg History of abnormal Pap:  no MMG:  03-02-21 category b density birads 1:neg BMD:   05-25-18 with primary Colonoscopy: 01-22-20 TDaP:  2014 Gardasil: n/a   reports that she quit smoking about 44 years ago. Her smoking use included cigarettes. She started smoking about 49 years ago. She has a 5.00 pack-year smoking history. She has never used smokeless tobacco. She reports current alcohol use. She reports that she does not use drugs. Retired [employer] photographer. Just occasional ETOH. Son in [place], Daughter in [place]. 3 grand kids.   Past Medical History:  Diagnosis Date   Allergy    seasonal   Arthritis    left hip, low back   Atrophic vaginitis 01/25/2011   Bladder prolapse, female, acquired 05/16/2013   Blood transfusion without reported diagnosis    Chicken pox as a child   Compression fracture of fourth lumbar vertebra ([place]) 04/25/2018   Cough 04/19/2012   Diverticulosis    Female sexual dysfunction 01/25/2011   Gallstones    GI bleed    Goiter    Hypertension    Measles as a child   Mumps as a child   Neutropenia 03/10/2011   Osteoporosis    Other and unspecified hyperlipidemia 08/23/2013   Other malaise and fatigue 08/26/2013   Pharyngitis, acute 03/09/2011   Urinary frequency 04/27/2011   Vaginitis 04/27/2011    Past Surgical History:  Procedure Laterality Date   BIOPSY THYROID     FNA, benign    BREAST BIOPSY Left    FNA, benign   BREAST EXCISIONAL BIOPSY Left    CHOLECYSTECTOMY  1998   COLONOSCOPY     more than 10 years ago. Michela Pitcher it was done [facility]   ERCP  [place]   with the ERCP as well for her gallbladder   HERNIA REPAIR Right    right, inguinal   in grown toenails removed     partial, b/l   laser surgery on vericose veins     b/l   NASAL SEPTUM SURGERY  2015   TONSILLECTOMY  as a child   [place]   done for polapse   VARICOSE VEIN SURGERY      Current Outpatient Medications  Medication Sig Dispense Refill   Calcium Carbonate-Vit D-Min (CALCIUM 600+D PLUS MINERALS) 600-400 MG-UNIT TABS Take by mouth.     Cholecalciferol (VITAMIN D3) 5000 UNITS CAPS Take 1 capsule by mouth daily.      Coenzyme Q10 (COQ-10) 200 MG CAPS Take 1 tablet by mouth daily.      estradiol (ESTRACE) 0.1 MG/GM vaginal cream INSERT 1 GRAM VAGINALLY EVERY NIGHT FOR ONE WEEK, THEN SPACE TO TWICE WEEKLY 42.5 g 1   Ferrous Sulfate (SLOW FE PO) Take 1 tablet by mouth  daily.     loratadine (CLARITIN) 10 MG tablet Take 10 mg by mouth daily.     metoprolol succinate (TOPROL-XL) 100 MG 24 hr tablet Take 1 tablet (100 mg total) by mouth daily. Take with or immediately following a meal. 90 tablet 2   Multiple Vitamin (MULTIVITAMIN) tablet Take 1 tablet by mouth daily.     Probiotic Product (PROBIOTIC DAILY PO) Take 1 tablet by mouth daily.      Red Yeast Rice Extract (RED YEAST RICE PO) Take 1 capsule by mouth daily.     triamterene-hydrochlorothiazide (MAXZIDE-25) 37.5-25 MG tablet Take 1 tablet by mouth daily. 90 tablet 2   No current facility-administered medications for this visit.    Family History  Problem Relation Age of Onset   Atrial fibrillation Mother    Obesity Mother    Diabetes Mother 21       Type 2   Arthritis Mother        in right knee   Heart disease Mother        atrial fibrillation, CHF   Hernia Mother        never  recovered from repair   Hypertension Father    Ulcers Father    Pulmonary fibrosis Father    Diabetes Maternal Grandmother        type 2?   Heart attack Maternal Grandmother    Hearing loss Maternal Grandmother        MI   Parkinsonism Maternal Grandfather    Stroke Paternal Grandmother    Colon cancer Maternal Aunt    Esophageal cancer Neg Hx     Review of Systems  Constitutional: Negative.   HENT: Negative.    Eyes: Negative.   Respiratory: Negative.    Cardiovascular: Negative.   Gastrointestinal: Negative.   Endocrine: Negative.   Genitourinary: Negative.   Musculoskeletal: Negative.   Skin: Negative.   Allergic/Immunologic: Negative.   Neurological: Negative.   Psychiatric/Behavioral: Negative.     Exam:   BP 122/80   Pulse 71   Resp 16   Ht 5' 6.5" (1.689 m)   Wt 154 lb (69.9 kg)   BMI 24.48 kg/m   Weight change: '@WEIGHTCHANGE'$ @ Height:   Height: 5' 6.5" (168.9 cm)  Ht Readings from Last 3 Encounters:  03/12/21 5' 6.5" (1.689 m)  10/23/20 '5\' 7"'$  (1.702 m)  08/26/20 '5\' 7"'$  (1.702 m)    General appearance: alert, cooperative and appears stated age Head: Normocephalic, without obvious abnormality, atraumatic Neck: no adenopathy, supple, symmetrical, trachea midline and thyroid normal to inspection and palpation Breasts: normal appearance, no masses or tenderness Abdomen: soft, non-tender; non distended,  no masses,  no organomegaly Extremities: extremities normal, atraumatic, no cyanosis or edema Skin: Skin color, texture, turgor normal. No rashes or lesions Lymph nodes: Cervical, supraclavicular, and axillary nodes normal. No abnormal inguinal nodes palpated Neurologic: Grossly normal   Pelvic: External genitalia:  no lesions              Urethra:  normal appearing urethra with no masses, tenderness or lesions              Bartholins and Skenes: normal                 Vagina: atrophic appearing vagina with normal color and discharge, no lesions. Pessary  placed.               Cervix: absent  Bimanual Exam:  Uterus:  uterus absent              Adnexa: no mass, fullness, tenderness               Rectovaginal: Confirms               Anus:  normal sphincter tone, no lesions  Royal Hawthorn chaperoned for the exam.   1. Gynecologic exam normal Mammogram UTD Pap next year Labs with primary  2. Pessary maintenance Doing well  3. Vaginal atrophy Continue with 2 x a week vaginal estrogen (doesn't need refill at this time)

## 2021-03-12 ENCOUNTER — Encounter: Payer: Self-pay | Admitting: Obstetrics and Gynecology

## 2021-03-12 ENCOUNTER — Ambulatory Visit (INDEPENDENT_AMBULATORY_CARE_PROVIDER_SITE_OTHER): Payer: Medicare HMO | Admitting: Obstetrics and Gynecology

## 2021-03-12 ENCOUNTER — Other Ambulatory Visit: Payer: Self-pay

## 2021-03-12 VITALS — BP 122/80 | HR 71 | Resp 16 | Ht 66.5 in | Wt 154.0 lb

## 2021-03-12 DIAGNOSIS — Z01419 Encounter for gynecological examination (general) (routine) without abnormal findings: Secondary | ICD-10-CM | POA: Diagnosis not present

## 2021-03-12 DIAGNOSIS — N952 Postmenopausal atrophic vaginitis: Secondary | ICD-10-CM

## 2021-03-12 DIAGNOSIS — Z4689 Encounter for fitting and adjustment of other specified devices: Secondary | ICD-10-CM | POA: Diagnosis not present

## 2021-03-12 NOTE — Patient Instructions (Signed)

## 2021-04-01 ENCOUNTER — Other Ambulatory Visit: Payer: Self-pay | Admitting: Family Medicine

## 2021-04-01 DIAGNOSIS — I10 Essential (primary) hypertension: Secondary | ICD-10-CM

## 2021-04-07 DIAGNOSIS — M25552 Pain in left hip: Secondary | ICD-10-CM | POA: Diagnosis not present

## 2021-04-07 DIAGNOSIS — M7632 Iliotibial band syndrome, left leg: Secondary | ICD-10-CM | POA: Diagnosis not present

## 2021-04-08 DIAGNOSIS — M25552 Pain in left hip: Secondary | ICD-10-CM | POA: Diagnosis not present

## 2021-04-13 DIAGNOSIS — I8311 Varicose veins of right lower extremity with inflammation: Secondary | ICD-10-CM | POA: Diagnosis not present

## 2021-04-13 DIAGNOSIS — I83811 Varicose veins of right lower extremities with pain: Secondary | ICD-10-CM | POA: Diagnosis not present

## 2021-04-20 DIAGNOSIS — I83892 Varicose veins of left lower extremities with other complications: Secondary | ICD-10-CM | POA: Diagnosis not present

## 2021-04-20 DIAGNOSIS — I8312 Varicose veins of left lower extremity with inflammation: Secondary | ICD-10-CM | POA: Diagnosis not present

## 2021-04-22 DIAGNOSIS — M25552 Pain in left hip: Secondary | ICD-10-CM | POA: Diagnosis not present

## 2021-04-28 ENCOUNTER — Encounter: Payer: Self-pay | Admitting: Family Medicine

## 2021-04-28 DIAGNOSIS — L989 Disorder of the skin and subcutaneous tissue, unspecified: Secondary | ICD-10-CM

## 2021-05-04 DIAGNOSIS — I8311 Varicose veins of right lower extremity with inflammation: Secondary | ICD-10-CM | POA: Diagnosis not present

## 2021-05-04 DIAGNOSIS — I83811 Varicose veins of right lower extremities with pain: Secondary | ICD-10-CM | POA: Diagnosis not present

## 2021-05-05 DIAGNOSIS — C44319 Basal cell carcinoma of skin of other parts of face: Secondary | ICD-10-CM | POA: Diagnosis not present

## 2021-05-05 DIAGNOSIS — L570 Actinic keratosis: Secondary | ICD-10-CM | POA: Diagnosis not present

## 2021-05-05 DIAGNOSIS — L814 Other melanin hyperpigmentation: Secondary | ICD-10-CM | POA: Diagnosis not present

## 2021-05-11 DIAGNOSIS — I8312 Varicose veins of left lower extremity with inflammation: Secondary | ICD-10-CM | POA: Diagnosis not present

## 2021-05-11 DIAGNOSIS — I83812 Varicose veins of left lower extremities with pain: Secondary | ICD-10-CM | POA: Diagnosis not present

## 2021-05-11 DIAGNOSIS — M7981 Nontraumatic hematoma of soft tissue: Secondary | ICD-10-CM | POA: Diagnosis not present

## 2021-05-19 DIAGNOSIS — M7632 Iliotibial band syndrome, left leg: Secondary | ICD-10-CM | POA: Diagnosis not present

## 2021-06-09 DIAGNOSIS — D1801 Hemangioma of skin and subcutaneous tissue: Secondary | ICD-10-CM | POA: Diagnosis not present

## 2021-06-09 DIAGNOSIS — L814 Other melanin hyperpigmentation: Secondary | ICD-10-CM | POA: Diagnosis not present

## 2021-06-09 DIAGNOSIS — L568 Other specified acute skin changes due to ultraviolet radiation: Secondary | ICD-10-CM | POA: Diagnosis not present

## 2021-06-09 DIAGNOSIS — L821 Other seborrheic keratosis: Secondary | ICD-10-CM | POA: Diagnosis not present

## 2021-06-16 DIAGNOSIS — Z0100 Encounter for examination of eyes and vision without abnormal findings: Secondary | ICD-10-CM | POA: Diagnosis not present

## 2021-06-16 DIAGNOSIS — H524 Presbyopia: Secondary | ICD-10-CM | POA: Diagnosis not present

## 2021-06-17 ENCOUNTER — Ambulatory Visit (INDEPENDENT_AMBULATORY_CARE_PROVIDER_SITE_OTHER): Payer: Medicare HMO | Admitting: Obstetrics and Gynecology

## 2021-06-17 ENCOUNTER — Other Ambulatory Visit: Payer: Self-pay

## 2021-06-17 ENCOUNTER — Encounter: Payer: Self-pay | Admitting: Obstetrics and Gynecology

## 2021-06-17 VITALS — BP 136/82 | HR 88 | Ht 67.0 in | Wt 156.0 lb

## 2021-06-17 DIAGNOSIS — Z4689 Encounter for fitting and adjustment of other specified devices: Secondary | ICD-10-CM | POA: Diagnosis not present

## 2021-06-17 NOTE — [facility] Notes (Signed)
GYNECOLOGY  VISIT   HPI: 65 y.o.   Widowed White or Caucasian Not Hispanic or Latino  female   [phone] with No LMP recorded. Patient has had a hysterectomy.   here for pessary maintenance. She has a #4 ring pessary with support to control her cystocele. She takes the pessary out at night and replaces it in the am. Uses estrogen cream 2 x a week at hs. Not sexually active.   No bladder c/o. Struggles with constipation, but able to manage it.   GYNECOLOGIC HISTORY: No LMP recorded. Patient has had a hysterectomy. Contraception:hysterectomy  Menopausal hormone therapy:yes, estradiol         OB History     Gravida  2   Para  2   Term  2   Preterm      AB      Living  2      SAB      IAB      Ectopic      Multiple      Live Births                 Patient Active Problem List   Diagnosis Date Noted   Left hip pain 03/02/2021   Pain due to onychomycosis of toenail of left foot 10/14/2020   IDA (iron deficiency anemia) 03/11/2020   Lower GI bleed 11/06/2019   Anemia 11/06/2019   Tinnitus of both ears 08/28/2019   Pressure sensation in both ears 08/28/2019   Bilateral carotid bruits 08/28/2019   Osteoporosis 08/28/2019   Muscle cramps 08/28/2019   Compression fracture of L4 vertebra (HCC) 11/07/2018   Low back pain 05/28/2018   Insomnia 05/23/2017   Multiple food allergies 05/19/2017   Bladder prolapse, female, acquired 05/16/2013   Cough 04/19/2012   Hypokalemia 03/10/2011   Neutropenia ([place]) 03/10/2011   Arthritis 01/25/2011   Atrophic vaginitis 01/25/2011   Welcome to Medicare preventive visit 01/25/2011   Diverticulosis 01/25/2011   Seasonal and perennial allergic rhinitis    Hypertension    Chicken pox    Mumps    Measles    Goiter     Past Medical History:  Diagnosis Date   Allergy    seasonal   Arthritis    left hip, low back   Atrophic vaginitis 01/25/2011   Bladder prolapse, female, acquired 05/16/2013   Blood transfusion without  reported diagnosis    Chicken pox as a child   Compression fracture of fourth lumbar vertebra ([place]) 04/25/2018   Cough 04/19/2012   Diverticulosis    Female sexual dysfunction 01/25/2011   Gallstones    GI bleed    Goiter    Hypertension    Measles as a child   Mumps as a child   Neutropenia 03/10/2011   Osteoporosis    Other and unspecified hyperlipidemia 08/23/2013   Other malaise and fatigue 08/26/2013   Pharyngitis, acute 03/09/2011   Urinary frequency 04/27/2011   Vaginitis 04/27/2011    Past Surgical History:  Procedure Laterality Date   BIOPSY THYROID     FNA, benign   BREAST BIOPSY Left    FNA, benign   BREAST EXCISIONAL BIOPSY Left    CHOLECYSTECTOMY  1998   COLONOSCOPY     more than 10 years ago. Michela Pitcher it was done at [facility]   ERCP  [place]   with the ERCP as well for her gallbladder   HERNIA REPAIR Right    right, inguinal  in grown toenails removed     partial, b/l   laser surgery on vericose veins     b/l   NASAL SEPTUM SURGERY  2015   TONSILLECTOMY  as a child   [place]   done for polapse   VARICOSE VEIN SURGERY      Current Outpatient Medications  Medication Sig Dispense Refill   Calcium Carbonate-Vit D-Min (CALCIUM 600+D PLUS MINERALS) 600-400 MG-UNIT TABS Take by mouth.     Cholecalciferol (VITAMIN D3) 5000 UNITS CAPS Take 1 capsule by mouth daily.      Coenzyme Q10 (COQ-10) 200 MG CAPS Take 1 tablet by mouth daily.      estradiol (ESTRACE) 0.1 MG/GM vaginal cream INSERT 1 GRAM VAGINALLY EVERY NIGHT FOR ONE WEEK, THEN SPACE TO TWICE WEEKLY 42.5 g 1   Ferrous Sulfate (SLOW FE PO) Take 1 tablet by mouth daily.     loratadine (CLARITIN) 10 MG tablet Take 10 mg by mouth daily.     metoprolol succinate (TOPROL-XL) 100 MG 24 hr tablet TAKE 1 TABLET BY MOUTH DAILY. TAKE WITH OR IMMEDIATELY FOLLOWING A MEAL. 90 tablet 1   Multiple Vitamin (MULTIVITAMIN) tablet Take 1 tablet by mouth daily.     Probiotic  Product (PROBIOTIC DAILY PO) Take 1 tablet by mouth daily.      Red Yeast Rice Extract (RED YEAST RICE PO) Take 1 capsule by mouth daily.     triamterene-hydrochlorothiazide (MAXZIDE-25) 37.5-25 MG tablet TAKE 1 TABLET BY MOUTH EVERY DAY 90 tablet 3   No current facility-administered medications for this visit.     ALLERGIES: Lisinopril  Family History  Problem Relation Age of Onset   Atrial fibrillation Mother    Obesity Mother    Diabetes Mother 46       Type 2   Arthritis Mother        in right knee   Heart disease Mother        atrial fibrillation, CHF   Hernia Mother        never recovered from repair   Hypertension Father    Ulcers Father    Pulmonary fibrosis Father    Diabetes Maternal Grandmother        type 2?   Heart attack Maternal Grandmother    Hearing loss Maternal Grandmother        MI   Parkinsonism Maternal Grandfather    Stroke Paternal Grandmother    Colon cancer Maternal Aunt    Esophageal cancer Neg Hx     Social History   Socioeconomic History   Marital status: Widowed    Spouse name: Not on file   Number of children: 2   Years of education: Not on file   Highest education level: Not on file  Occupational History   Occupation: NICU    Employer: [employer]  Tobacco Use   Smoking status: Former    Packs/day: 1.00    Years: 5.00    Pack years: 5.00    Types: Cigarettes    Start date: 38    Quit date: 07/12/1976    Years since quitting: 44.9   Smokeless tobacco: Never  Vaping Use   Vaping Use: Never used  Substance and Sexual Activity   Alcohol use: Yes    Comment: occasionally   Drug use: No   Sexual activity: Not Currently    Partners: Male    Birth control/protection: Surgical    Comment: hysterectomy  Other Topics Concern   Not  on file  Social History Narrative   Not on file   Social Determinants of Health   Financial Resource Strain: Not on file  Food Insecurity: Not on file  Transportation Needs: Not on file   Physical Activity: Not on file  Stress: Not on file  Social Connections: Not on file  Intimate Partner Violence: Not on file    Review of Systems  All other systems reviewed and are negative.  PHYSICAL EXAMINATION:    BP 136/82   Pulse 88   Ht 5\' 7"  (1.702 m)   Wt 156 lb (70.8 kg)   SpO2 99%   BMI 24.43 kg/m     General appearance: alert, cooperative and appears stated age   Pelvic: External genitalia:  no lesions              Urethra:  normal appearing urethra with no masses, tenderness or lesions              Bartholins and Skenes: normal                 Vagina: normal appearing vagina with normal color and discharge, no lesions. Pessary placed              Cervix: no lesions               Chaperone was present for exam.  1. Pessary maintenance Doing well F/U in 4 months

## 2021-06-29 DIAGNOSIS — Z481 Encounter for planned postprocedural wound closure: Secondary | ICD-10-CM | POA: Diagnosis not present

## 2021-06-29 DIAGNOSIS — C4491 Basal cell carcinoma of skin, unspecified: Secondary | ICD-10-CM | POA: Diagnosis not present

## 2021-06-29 DIAGNOSIS — C44319 Basal cell carcinoma of skin of other parts of face: Secondary | ICD-10-CM | POA: Diagnosis not present

## 2021-09-30 ENCOUNTER — Other Ambulatory Visit: Payer: Self-pay | Admitting: Family Medicine

## 2021-09-30 ENCOUNTER — Other Ambulatory Visit: Payer: Self-pay | Admitting: Obstetrics and Gynecology

## 2021-10-01 NOTE — Telephone Encounter (Signed)
Medication refill request: estrace vaginal cream  ?Last AEX:  03-12-21 JJ ?Next AEX: not scheduled  ?Last MMG (if hormonal medication request): 03-02-21 density B/BIRADS 1 negative  ?Refill authorized: Today, please advise.  ? ?Medication pended for #42.5g, 0RF. Please refill if appropriate.  ? ?

## 2021-10-12 ENCOUNTER — Encounter: Payer: Self-pay | Admitting: Family

## 2021-10-19 ENCOUNTER — Ambulatory Visit (INDEPENDENT_AMBULATORY_CARE_PROVIDER_SITE_OTHER): Payer: No Typology Code available for payment source | Admitting: Obstetrics and Gynecology

## 2021-10-19 ENCOUNTER — Encounter: Payer: Self-pay | Admitting: Obstetrics and Gynecology

## 2021-10-19 ENCOUNTER — Encounter: Payer: Self-pay | Admitting: Family

## 2021-10-19 VITALS — BP 126/74 | HR 64 | Ht 67.0 in | Wt 160.0 lb

## 2021-10-19 DIAGNOSIS — Z4689 Encounter for fitting and adjustment of other specified devices: Secondary | ICD-10-CM

## 2021-10-19 NOTE — Progress Notes (Signed)
GYNECOLOGY  VISIT ?  ?HPI: ?66 y.o.   Widowed White or Caucasian Not Hispanic or Latino  female   ?[id number] with No LMP recorded. Patient has had a hysterectomy.   ?here for pessary maintenance.    ?She has a #4 ring pessary to control her cystocele. She takes the pessary out at night and replaces it in the am. Uses estrogen cream 2 x a week at hs. Not sexually active. ?No vaginal bleeding. Normal bladder function. ?Thinks she has IBS, goes from constipation to loose stools, manageable.  ? ?GYNECOLOGIC HISTORY: ?No LMP recorded. Patient has had a hysterectomy. ?Contraception:Hysterectomy ?Menopausal hormone therapy: yes, estradiol  ?       ?OB History   ? ? Gravida  ?2  ? Para  ?2  ? Term  ?2  ? Preterm  ?   ? AB  ?   ? Living  ?2  ?  ? ? SAB  ?   ? IAB  ?   ? Ectopic  ?   ? Multiple  ?   ? Live Births  ?   ?   ?  ?  ?    ? ?Patient Active Problem List  ? Diagnosis Date Noted  ? Left hip pain 03/02/2021  ? Pain due to onychomycosis of toenail of left foot 10/14/2020  ? IDA (iron deficiency anemia) 03/11/2020  ? Lower GI bleed 11/06/2019  ? Anemia 11/06/2019  ? Tinnitus of both ears 08/28/2019  ? Pressure sensation in both ears 08/28/2019  ? Bilateral carotid bruits 08/28/2019  ? Osteoporosis 08/28/2019  ? Muscle cramps 08/28/2019  ? Compression fracture of L4 vertebra ([place]) 11/07/2018  ? Low back pain 05/28/2018  ? Insomnia 05/23/2017  ? Multiple food allergies 05/19/2017  ? Bladder prolapse, female, acquired 05/16/2013  ? Cough 04/19/2012  ? Hypokalemia 03/10/2011  ? Neutropenia ([place]) 03/10/2011  ? Arthritis 01/25/2011  ? Atrophic vaginitis 01/25/2011  ? Welcome to Medicare preventive visit 01/25/2011  ? Diverticulosis 01/25/2011  ? Seasonal and perennial allergic rhinitis   ? Hypertension   ? Chicken pox   ? Mumps   ? Measles   ? Goiter   ? ? ?Past Medical History:  ?Diagnosis Date  ? Allergy   ? seasonal  ? Arthritis   ? left hip, low back  ? Atrophic vaginitis 01/25/2011  ? Bladder prolapse, female, acquired  05/16/2013  ? Blood transfusion without reported diagnosis   ? Chicken pox as a child  ? Compression fracture of fourth lumbar vertebra ([place]) 04/25/2018  ? Cough 04/19/2012  ? Diverticulosis   ? Female sexual dysfunction 01/25/2011  ? Gallstones   ? GI bleed   ? Goiter   ? Hypertension   ? Measles as a child  ? Mumps as a child  ? Neutropenia 03/10/2011  ? Osteoporosis   ? Other and unspecified hyperlipidemia 08/23/2013  ? Other malaise and fatigue 08/26/2013  ? Pharyngitis, acute 03/09/2011  ? Urinary frequency 04/27/2011  ? Vaginitis 04/27/2011  ? ? ?Past Surgical History:  ?Procedure Laterality Date  ? BIOPSY THYROID    ? FNA, benign  ? BREAST BIOPSY Left   ? FNA, benign  ? BREAST EXCISIONAL BIOPSY Left   ? CHOLECYSTECTOMY  1998  ? COLONOSCOPY    ? more than 10 years ago. Michela Pitcher it was done at [facility]  ? ERCP  1999  ? ESOPHAGOGASTRODUODENOSCOPY  1999  ? with the ERCP as well for her gallbladder  ? HERNIA REPAIR Right   ?  right, inguinal  ? in grown toenails removed    ? partial, b/l  ? laser surgery on vericose veins    ? b/l  ? NASAL SEPTUM SURGERY  2015  ? skin cancer removal face    ? TONSILLECTOMY  as a child  ? VAGINAL HYSTERECTOMY  1988  ? done for polapse  ? VARICOSE VEIN SURGERY    ? ? ?Current Outpatient Medications  ?Medication Sig Dispense Refill  ? Calcium Carbonate-Vit D-Min (CALCIUM 600+D PLUS MINERALS) 600-400 MG-UNIT TABS Take by mouth.    ? Cholecalciferol (VITAMIN D3) 5000 UNITS CAPS Take 1 capsule by mouth daily.     ? Coenzyme Q10 (COQ-10) 200 MG CAPS Take 1 tablet by mouth daily.     ? estradiol (ESTRACE) 0.1 MG/GM vaginal cream INSERT 1 GRAM VAGINALLY EVERY NIGHT FOR ONE WEEK, THEN SPACE TO TWICE WEEKLY 42.5 g 0  ? Ferrous Sulfate (SLOW FE PO) Take 1 tablet by mouth daily.    ? loratadine (CLARITIN) 10 MG tablet Take 10 mg by mouth daily.    ? metoprolol succinate (TOPROL-XL) 100 MG 24 hr tablet TAKE 1 TABLET BY MOUTH EVERY DAY WITH OR IMMEDIATELY FOLLOWING A MEAL 90 tablet 1  ? Multiple Vitamin  (MULTIVITAMIN) tablet Take 1 tablet by mouth daily.    ? Probiotic Product (PROBIOTIC DAILY PO) Take 1 tablet by mouth daily.     ? Red Yeast Rice Extract (RED YEAST RICE PO) Take 1 capsule by mouth daily.    ? triamterene-hydrochlorothiazide (MAXZIDE-25) 37.5-25 MG tablet TAKE 1 TABLET BY MOUTH EVERY DAY 90 tablet 3  ? ?No current facility-administered medications for this visit.  ?  ? ?ALLERGIES: Lisinopril ? ?Family History  ?Problem Relation Age of Onset  ? Atrial fibrillation Mother   ? Obesity Mother   ? Diabetes Mother 32  ?     Type 2  ? Arthritis Mother   ?     in right knee  ? Heart disease Mother   ?     atrial fibrillation, CHF  ? Hernia Mother   ?     never recovered from repair  ? Hypertension Father   ? Ulcers Father   ? Pulmonary fibrosis Father   ? Diabetes Maternal Grandmother   ?     type 2?  ? Heart attack Maternal Grandmother   ? Hearing loss Maternal Grandmother   ?     MI  ? Parkinsonism Maternal Grandfather   ? Stroke Paternal Grandmother   ? Colon cancer Maternal Aunt   ? Esophageal cancer Neg Hx   ? ? ?Social History  ? ?Socioeconomic History  ? Marital status: Widowed  ?  Spouse name: Not on file  ? Number of children: 2  ? Years of education: Not on file  ? Highest education level: Not on file  ?Occupational History  ? Occupation: NICU  ?  Employer: [employer]  ?Tobacco Use  ? Smoking status: Former  ?  Packs/day: 1.00  ?  Years: 5.00  ?  Pack years: 5.00  ?  Types: Cigarettes  ?  Start date: 24  ?  Quit date: 07/12/1976  ?  Years since quitting: 45.3  ? Smokeless tobacco: Never  ?Vaping Use  ? Vaping Use: Never used  ?Substance and Sexual Activity  ? Alcohol use: Yes  ?  Comment: occasionally  ? Drug use: No  ? Sexual activity: Not Currently  ?  Partners: Male  ?  Birth control/protection: Surgical  ?  Comment: hysterectomy  ?Other Topics Concern  ? Not on file  ?Social History Narrative  ? Not on file  ? ?Social Determinants of Health  ? ?Financial Resource Strain: Not on file  ?Food  Insecurity: Not on file  ?Transportation Needs: Not on file  ?Physical Activity: Not on file  ?Stress: Not on file  ?Social Connections: Not on file  ?Intimate Partner Violence: Not on file  ? ? ?ROS ? ?PHYSICAL EXAMINATION:   ? ?BP 126/74   Pulse 64   Ht '5\' 7"'$  (1.702 m)   Wt 160 lb (72.6 kg)   SpO2 99%   BMI 25.06 kg/m?     ?General appearance: alert, cooperative and appears stated age ? ?Pelvic: External genitalia:  no lesions ?             Urethra:  normal appearing urethra with no masses, tenderness or lesions ?             Bartholins and Skenes: normal    ?             Vagina: the pessary was removed and cleaned. No vaginal irritation. The pessary was replaced.  ?             Cervix: absent ?              ?Chaperone was present for exam. ? ?1. Pessary maintenance ?Doing well ?She will continue with her current regimen of taking the pessary out overnight and using estrogen cream 2 x a week ?Call with any concerns, otherwise f/u in 4 months ? ? ?

## 2021-12-08 DIAGNOSIS — L820 Inflamed seborrheic keratosis: Secondary | ICD-10-CM | POA: Diagnosis not present

## 2021-12-08 DIAGNOSIS — L814 Other melanin hyperpigmentation: Secondary | ICD-10-CM | POA: Diagnosis not present

## 2021-12-08 DIAGNOSIS — L538 Other specified erythematous conditions: Secondary | ICD-10-CM | POA: Diagnosis not present

## 2021-12-08 DIAGNOSIS — D225 Melanocytic nevi of trunk: Secondary | ICD-10-CM | POA: Diagnosis not present

## 2021-12-08 DIAGNOSIS — L821 Other seborrheic keratosis: Secondary | ICD-10-CM | POA: Diagnosis not present

## 2022-01-03 ENCOUNTER — Other Ambulatory Visit: Payer: Self-pay | Admitting: Family Medicine

## 2022-01-03 DIAGNOSIS — I10 Essential (primary) hypertension: Secondary | ICD-10-CM

## 2022-01-09 ENCOUNTER — Other Ambulatory Visit: Payer: Self-pay | Admitting: Obstetrics and Gynecology

## 2022-01-13 NOTE — Telephone Encounter (Signed)
Last AEX-03/12/21. Last mammo 03/02/21-neg birads 1

## 2022-02-01 ENCOUNTER — Other Ambulatory Visit (HOSPITAL_BASED_OUTPATIENT_CLINIC_OR_DEPARTMENT_OTHER): Payer: Self-pay | Admitting: Family Medicine

## 2022-02-01 DIAGNOSIS — Z1231 Encounter for screening mammogram for malignant neoplasm of breast: Secondary | ICD-10-CM

## 2022-02-16 DIAGNOSIS — H524 Presbyopia: Secondary | ICD-10-CM | POA: Diagnosis not present

## 2022-02-16 DIAGNOSIS — Z0100 Encounter for examination of eyes and vision without abnormal findings: Secondary | ICD-10-CM | POA: Diagnosis not present

## 2022-02-18 ENCOUNTER — Encounter: Payer: Self-pay | Admitting: Obstetrics and Gynecology

## 2022-02-18 ENCOUNTER — Ambulatory Visit: Payer: No Typology Code available for payment source | Admitting: Obstetrics and Gynecology

## 2022-02-18 VITALS — BP 110/72 | HR 67 | Ht 67.5 in | Wt 162.0 lb

## 2022-02-18 DIAGNOSIS — N952 Postmenopausal atrophic vaginitis: Secondary | ICD-10-CM

## 2022-02-18 DIAGNOSIS — Z4689 Encounter for fitting and adjustment of other specified devices: Secondary | ICD-10-CM | POA: Diagnosis not present

## 2022-02-18 NOTE — [facility] Notes (Signed)
GYNECOLOGY  VISIT   HPI: 66 y.o.   Widowed White or Caucasian Not Hispanic or Latino  female   [phone] with No LMP recorded. Patient has had a hysterectomy.   here for pessary maintenance. She has a #4 ring pessary to control her cystocele. She takes the pessary out at night and replaces it in the am. Uses estrogen cream 2 x a week at hs. Not sexually active. No vaginal bleeding. Normal bowel and bladder function.   GYNECOLOGIC HISTORY: No LMP recorded. Patient has had a hysterectomy. Contraception:pmp Menopausal hormone therapy: estrace cream.         OB History     Gravida  2   Para  2   Term  2   Preterm      AB      Living  2      SAB      IAB      Ectopic      Multiple      Live Births                 Patient Active Problem List   Diagnosis Date Noted   Left hip pain 03/02/2021   Pain due to onychomycosis of toenail of left foot 10/14/2020   IDA (iron deficiency anemia) 03/11/2020   Lower GI bleed 11/06/2019   Anemia 11/06/2019   Tinnitus of both ears 08/28/2019   Pressure sensation in both ears 08/28/2019   Bilateral carotid bruits 08/28/2019   Osteoporosis 08/28/2019   Muscle cramps 08/28/2019   Compression fracture of L4 vertebra (HCC) 11/07/2018   Low back pain 05/28/2018   Insomnia 05/23/2017   Multiple food allergies 05/19/2017   Bladder prolapse, female, acquired 05/16/2013   Cough 04/19/2012   Hypokalemia 03/10/2011   Neutropenia ([place]) 03/10/2011   Arthritis 01/25/2011   Atrophic vaginitis 01/25/2011   Welcome to Medicare preventive visit 01/25/2011   Diverticulosis 01/25/2011   Seasonal and perennial allergic rhinitis    Hypertension    Chicken pox    Mumps    Measles    Goiter     Past Medical History:  Diagnosis Date   Allergy    seasonal   Arthritis    left hip, low back   Atrophic vaginitis 01/25/2011   Bladder prolapse, female, acquired 05/16/2013   Blood transfusion without reported diagnosis    Chicken pox as a  child   Compression fracture of fourth lumbar vertebra ([place]) 04/25/2018   Cough 04/19/2012   Diverticulosis    Female sexual dysfunction 01/25/2011   Gallstones    GI bleed    Goiter    Hypertension    Measles as a child   Mumps as a child   Neutropenia 03/10/2011   Osteoporosis    Other and unspecified hyperlipidemia 08/23/2013   Other malaise and fatigue 08/26/2013   Pharyngitis, acute 03/09/2011   Urinary frequency 04/27/2011   Vaginitis 04/27/2011    Past Surgical History:  Procedure Laterality Date   BIOPSY THYROID     FNA, benign   BREAST BIOPSY Left    FNA, benign   BREAST EXCISIONAL BIOPSY Left    CHOLECYSTECTOMY  1998   COLONOSCOPY     more than 10 years ago. Michela Pitcher it was done [facility]   ERCP  [place]   with the ERCP as well for her gallbladder   HERNIA REPAIR Right    right, inguinal   in grown toenails removed  partial, b/l   laser surgery on vericose veins     b/l   NASAL SEPTUM SURGERY  2015   skin cancer removal face     TONSILLECTOMY  as a child   VAGINAL HYSTERECTOMY  1988   done for polapse   VARICOSE VEIN SURGERY      Current Outpatient Medications  Medication Sig Dispense Refill   Calcium Carbonate-Vit D-Min (CALCIUM 600+D PLUS MINERALS) 600-400 MG-UNIT TABS Take by mouth.     Cholecalciferol (VITAMIN D3) 5000 UNITS CAPS Take 1 capsule by mouth daily.      Coenzyme Q10 (COQ-10) 200 MG CAPS Take 1 tablet by mouth daily.      estradiol (ESTRACE) 0.1 MG/GM vaginal cream INSERT 1 GRAM VAGINALLY EVERY NIGHT FOR ONE WEEK, THEN TWICE WEEKLY 42.5 g 0   loratadine (CLARITIN) 10 MG tablet Take 10 mg by mouth daily.     metoprolol succinate (TOPROL-XL) 100 MG 24 hr tablet TAKE 1 TABLET BY MOUTH EVERY DAY WITH OR IMMEDIATELY FOLLOWING A MEAL 90 tablet 1   Multiple Vitamin (MULTIVITAMIN) tablet Take 1 tablet by mouth daily.     Probiotic Product (PROBIOTIC DAILY PO) Take 1 tablet by mouth daily.      Red Yeast Rice  Extract (RED YEAST RICE PO) Take 1 capsule by mouth daily.     triamterene-hydrochlorothiazide (MAXZIDE-25) 37.5-25 MG tablet TAKE 1 TABLET BY MOUTH EVERY DAY 90 tablet 3   No current facility-administered medications for this visit.     ALLERGIES: Lisinopril  Family History  Problem Relation Age of Onset   Atrial fibrillation Mother    Obesity Mother    Diabetes Mother 1       Type 2   Arthritis Mother        in right knee   Heart disease Mother        atrial fibrillation, CHF   Hernia Mother        never recovered from repair   Hypertension Father    Ulcers Father    Pulmonary fibrosis Father    Diabetes Maternal Grandmother        type 2?   Heart attack Maternal Grandmother    Hearing loss Maternal Grandmother        MI   Parkinsonism Maternal Grandfather    Stroke Paternal Grandmother    Colon cancer Maternal Aunt    Esophageal cancer Neg Hx     Social History   Socioeconomic History   Marital status: Widowed    Spouse name: Not on file   Number of children: 2   Years of education: Not on file   Highest education level: Not on file  Occupational History   Occupation: NICU    Employer: [employer]  Tobacco Use   Smoking status: Former    Packs/day: 1.00    Years: 5.00    Total pack years: 5.00    Types: Cigarettes    Start date: 59    Quit date: 07/12/1976    Years since quitting: 45.6   Smokeless tobacco: Never  Vaping Use   Vaping Use: Never used  Substance and Sexual Activity   Alcohol use: Yes    Comment: occasionally   Drug use: No   Sexual activity: Not Currently    Partners: Male    Birth control/protection: Surgical    Comment: hysterectomy  Other Topics Concern   Not on file  Social History Narrative   Not on file   Social Determinants of  Health   Financial Resource Strain: Not on file  Food Insecurity: Not on file  Transportation Needs: Not on file  Physical Activity: Not on file  Stress: Not on file  Social Connections: Not  on file  Intimate Partner Violence: Not on file    Review of Systems  All other systems reviewed and are negative.   PHYSICAL EXAMINATION:    BP 110/72   Pulse 67   Ht 5' 7.5" (1.715 m)   Wt 162 lb (73.5 kg)   SpO2 100%   BMI 25.00 kg/m     General appearance: alert, cooperative and appears stated age  Pelvic: External genitalia:  no lesions              Urethra:  normal appearing urethra with no masses, tenderness or lesions              Bartholins and Skenes: normal                 Vagina: pessary removed and cleaned. No vaginal irritation, pessary replaced.               Cervix: absent                Chaperone was present for exam.  1. Pessary maintenance Doing well Continue with current regimen and 2 x a week vaginal estrogen Will space out f/u for 5 months, if doing well will change to 6 months  2. Vaginal atrophy Continue with 2 x a week vaginal estrogen

## 2022-02-25 ENCOUNTER — Encounter: Payer: Self-pay | Admitting: Family

## 2022-03-04 ENCOUNTER — Encounter: Payer: Self-pay | Admitting: Family Medicine

## 2022-03-04 ENCOUNTER — Ambulatory Visit (INDEPENDENT_AMBULATORY_CARE_PROVIDER_SITE_OTHER): Payer: No Typology Code available for payment source | Admitting: Family Medicine

## 2022-03-04 VITALS — BP 122/72 | HR 61 | Temp 98.0°F | Resp 16 | Ht 67.0 in | Wt 160.4 lb

## 2022-03-04 DIAGNOSIS — D649 Anemia, unspecified: Secondary | ICD-10-CM

## 2022-03-04 DIAGNOSIS — Z0000 Encounter for general adult medical examination without abnormal findings: Secondary | ICD-10-CM | POA: Diagnosis not present

## 2022-03-04 DIAGNOSIS — E785 Hyperlipidemia, unspecified: Secondary | ICD-10-CM | POA: Diagnosis not present

## 2022-03-04 DIAGNOSIS — M810 Age-related osteoporosis without current pathological fracture: Secondary | ICD-10-CM

## 2022-03-04 DIAGNOSIS — M461 Sacroiliitis, not elsewhere classified: Secondary | ICD-10-CM | POA: Insufficient documentation

## 2022-03-04 DIAGNOSIS — I10 Essential (primary) hypertension: Secondary | ICD-10-CM | POA: Diagnosis not present

## 2022-03-04 NOTE — Assessment & Plan Note (Addendum)
Patient encouraged to maintain heart healthy diet, regular exercise, adequate sleep. Consider daily probiotics. Take medications as prescribed. Labs ordered and reviewed. Colonoscopy 2021 repeat in 10 years. Dexa 2019 repeat this year. MGM August 2022 repeat every 1-2 years. Pap no longer due to hysterectomy. Reviewed immunizations.

## 2022-03-04 NOTE — Progress Notes (Signed)
Subjective:   By signing my name below, I, Kellie Simmering, attest that this documentation has been prepared under the direction and in the presence of Mosie Lukes, MD 03/04/2022.   Patient ID: Ann Barton, female    DOB: [birth year], 66 y.o.   MRN: [id number]  Chief Complaint  Patient presents with   Annual Exam    Here for Annual Exam   HPI Patient is in today for a comprehensive physical exam.  She denies having any fever, chills, ear pain, headaches, muscle pain, joint pain, new moles, rash, itching, congestion, sinus pain, sore throat, chest pain, palpitations, wheezing, nausea, vomitting, abdominal pain, diarrhea, constipation, blood in stool, dysuria, urgency, frequency and hematuria.  Family history: She states that her brother has AFIB.  Immunizations: She is up to date on her Shingles and Tetanus immunizations. She has been informed about receiving COVID-19, high-dose Flu, Pneumonia and RSV immunizations.    Sacroiliitis: She complains of left hip pain that worsens when bending over. She reports that the pain started 2 months ago and rates the pain as 5/10. She denies any recent falls or injuries. She is interested in a sports medicine reference. She states that she has been to [facility] to manage her hip pain.   Bowel movement: She reports that she is having multiple loose bowel movements daily. She states that these loose bowel movements have been occurring for several years and suspects IBS. She is currently taking 1 Metamucil capsule daily to manage this.   Dermatology: She states that she saw her dermatologist and had a basal cell carcinoma removed on her forehead.  Past Medical History:  Diagnosis Date   Allergy    seasonal   Arthritis    left hip, low back   Atrophic vaginitis 01/25/2011   Bladder prolapse, female, acquired 05/16/2013   Blood transfusion without reported diagnosis    Chicken pox as a child   Compression fracture of fourth lumbar vertebra  ([place]) 04/25/2018   Cough 04/19/2012   Diverticulosis    Female sexual dysfunction 01/25/2011   Gallstones    GI bleed    Goiter    Hypertension    Measles as a child   Mumps as a child   Neutropenia 03/10/2011   Osteoporosis    Other and unspecified hyperlipidemia 08/23/2013   Other malaise and fatigue 08/26/2013   Pharyngitis, acute 03/09/2011   Urinary frequency 04/27/2011   Vaginitis 04/27/2011   Past Surgical History:  Procedure Laterality Date   BIOPSY THYROID     FNA, benign   BREAST BIOPSY Left    FNA, benign   BREAST EXCISIONAL BIOPSY Left    CHOLECYSTECTOMY  1998   COLONOSCOPY     more than 10 years ago. Michela Pitcher it was done at [facility]   ERCP  [place]   with the ERCP as well for her gallbladder   HERNIA REPAIR Right    right, inguinal   in grown toenails removed     partial, b/l   laser surgery on vericose veins     b/l   NASAL SEPTUM SURGERY  2015   skin cancer removal face     TONSILLECTOMY  as a child   [place]   done for polapse   VARICOSE VEIN SURGERY     Family History  Problem Relation Age of Onset   Atrial fibrillation Mother    Obesity Mother    Diabetes Mother 35  Type 2   Arthritis Mother        in right knee   Heart disease Mother        atrial fibrillation, CHF   Hernia Mother        never recovered from repair   Hypertension Father    Ulcers Father    Pulmonary fibrosis Father    Atrial fibrillation Brother    Colon cancer Maternal Aunt    Diabetes Maternal Grandmother        type 2?   Heart attack Maternal Grandmother    Hearing loss Maternal Grandmother        MI   Parkinsonism Maternal Grandfather    Stroke Paternal Grandmother    Esophageal cancer Neg Hx    Social History   Socioeconomic History   Marital status: Widowed    Spouse name: Not on file   Number of children: 2   Years of education: Not on file   Highest education level: Not on file  Occupational  History   Occupation: NICU    Employer:   Tobacco Use   Smoking status: Former    Packs/day: 1.00    Years: 5.00    Total pack years: 5.00    Types: Cigarettes    Start date: 66    Quit date: 07/12/1976    Years since quitting: 45.6   Smokeless tobacco: Never  Vaping Use   Vaping Use: Never used  Substance and Sexual Activity   Alcohol use: Yes    Comment: occasionally   Drug use: No   Sexual activity: Not Currently    Partners: Male    Birth control/protection: Surgical    Comment: hysterectomy  Other Topics Concern   Not on file  Social History Narrative   Not on file   Social Determinants of Health   Financial Resource Strain: Not on file  Food Insecurity: Not on file  Transportation Needs: Not on file  Physical Activity: Not on file  Stress: Not on file  Social Connections: Not on file  Intimate Partner Violence: Not on file   Outpatient Medications Prior to Visit  Medication Sig Dispense Refill   Calcium Carbonate-Vit D-Min (CALCIUM 600+D PLUS MINERALS) 600-400 MG-UNIT TABS Take by mouth.     Cholecalciferol (VITAMIN D3) 5000 UNITS CAPS Take 1 capsule by mouth daily.      Coenzyme Q10 (COQ-10) 200 MG CAPS Take 1 tablet by mouth daily.      estradiol (ESTRACE) 0.1 MG/GM vaginal cream INSERT 1 GRAM VAGINALLY EVERY NIGHT FOR ONE WEEK, THEN TWICE WEEKLY 42.5 g 0   loratadine (CLARITIN) 10 MG tablet Take 10 mg by mouth daily.     metoprolol succinate (TOPROL-XL) 100 MG 24 hr tablet TAKE 1 TABLET BY MOUTH EVERY DAY WITH OR IMMEDIATELY FOLLOWING A MEAL 90 tablet 1   Multiple Vitamin (MULTIVITAMIN) tablet Take 1 tablet by mouth daily.     Probiotic Product (PROBIOTIC DAILY PO) Take 1 tablet by mouth daily.      Red Yeast Rice Extract (RED YEAST RICE PO) Take 1 capsule by mouth daily.     triamterene-hydrochlorothiazide (MAXZIDE-25) 37.5-25 MG tablet TAKE 1 TABLET BY MOUTH EVERY DAY 90 tablet 3   No facility-administered medications prior to visit.    Allergies  Allergen Reactions   Lisinopril Cough   Review of Systems  Constitutional:  Negative for chills and fever.  HENT:  Negative for congestion, ear pain, sinus pain and sore throat.   Respiratory:  Negative for cough, shortness of breath and wheezing.   Cardiovascular:  Negative for chest pain and palpitations.  Gastrointestinal:  Negative for abdominal pain, blood in stool, constipation, diarrhea, nausea and vomiting.       (+) Loose bowel movements.  Genitourinary:  Negative for dysuria, frequency, hematuria and urgency.  Musculoskeletal:  Negative for falls, joint pain and myalgias.  Skin:  Negative for itching and rash.       (-) New moles.  Neurological:  Negative for headaches.      Objective:    Physical Exam Constitutional:      General: She is not in acute distress.    Appearance: Normal appearance. She is not ill-appearing.  HENT:     Head: Normocephalic and atraumatic.     Right Ear: Tympanic membrane, ear canal and external ear normal.     Left Ear: Tympanic membrane, ear canal and external ear normal.     Mouth/Throat:     Mouth: Mucous membranes are moist.     Pharynx: Oropharynx is clear.  Eyes:     Extraocular Movements: Extraocular movements intact.     Right eye: No nystagmus.     Left eye: No nystagmus.     Pupils: Pupils are equal, round, and reactive to light.  Neck:     Vascular: No carotid bruit.  Cardiovascular:     Rate and Rhythm: Normal rate and regular rhythm.     Pulses: Normal pulses.     Heart sounds: Normal heart sounds. No murmur heard.    No gallop.  Pulmonary:     Effort: Pulmonary effort is normal. No respiratory distress.     Breath sounds: Normal breath sounds. No wheezing or rales.  Abdominal:     General: Bowel sounds are normal.     Tenderness: There is no abdominal tenderness.  Musculoskeletal:     Comments: Muscle strength 5/5 on upper and lower extremities.   Lymphadenopathy:     Cervical: No cervical  adenopathy.  Skin:    General: Skin is warm and dry.  Neurological:     Mental Status: She is alert and oriented to person, place, and time.     Deep Tendon Reflexes:     Reflex Scores:      Patellar reflexes are 2+ on the right side and 2+ on the left side. Psychiatric:        Mood and Affect: Mood normal.        Behavior: Behavior normal.        Judgment: Judgment normal.    BP 122/72 (BP Location: Right Arm, Patient Position: Sitting, Cuff Size: Normal)   Pulse 61   Temp 98 F (36.7 C) (Oral)   Resp 16   Ht '5\' 7"'$  (1.702 m)   Wt 160 lb 6.4 oz (72.8 kg)   SpO2 95%   BMI 25.12 kg/m  Wt Readings from Last 3 Encounters:  03/04/22 160 lb 6.4 oz (72.8 kg)  02/18/22 162 lb (73.5 kg)  10/19/21 160 lb (72.6 kg)   Diabetic Foot Exam - Simple   No data filed    Lab Results  Component Value Date   WBC 5.6 03/04/2022   HGB 13.3 03/04/2022   HCT 39.8 03/04/2022   PLT 222.0 03/04/2022   GLUCOSE 87 03/04/2022   CHOL 178 03/04/2022   TRIG 192.0 (H) 03/04/2022   HDL 48.80 03/04/2022   LDLCALC 91 03/04/2022   ALT 15 03/04/2022   AST 17 03/04/2022   NA  138 03/04/2022   K 4.2 03/04/2022   CL 100 03/04/2022   CREATININE 0.83 03/04/2022   BUN 20 03/04/2022   CO2 29 03/04/2022   TSH 0.57 03/04/2022   Lab Results  Component Value Date   TSH 0.57 03/04/2022   Lab Results  Component Value Date   WBC 5.6 03/04/2022   HGB 13.3 03/04/2022   HCT 39.8 03/04/2022   MCV 88.4 03/04/2022   PLT 222.0 03/04/2022   Lab Results  Component Value Date   NA 138 03/04/2022   K 4.2 03/04/2022   CO2 29 03/04/2022   GLUCOSE 87 03/04/2022   BUN 20 03/04/2022   CREATININE 0.83 03/04/2022   BILITOT 0.3 03/04/2022   ALKPHOS 61 03/04/2022   AST 17 03/04/2022   ALT 15 03/04/2022   PROT 6.8 03/04/2022   ALBUMIN 4.3 03/04/2022   CALCIUM 9.7 03/04/2022   ANIONGAP 5 03/11/2020   GFR 73.27 03/04/2022   Lab Results  Component Value Date   CHOL 178 03/04/2022   Lab Results  Component  Value Date   HDL 48.80 03/04/2022   Lab Results  Component Value Date   LDLCALC 91 03/04/2022   Lab Results  Component Value Date   TRIG 192.0 (H) 03/04/2022   Lab Results  Component Value Date   CHOLHDL 4 03/04/2022   No results found for: "HGBA1C"     Colonoscopy: Last completed on 01/22/2020. Repeat in 2031. - The perianal and digital rectal examinations were normal. - Multiple small and large-mouthed diverticula were found in the entire colon. - Non-bleeding internal hemorrhoids were found during retroflexion. The hemorrhoids were small and Grade I (internal hemorrhoids that do not prolapse). - The exam was otherwise normal throughout the remainder of the colon. - The terminal ileum appeared normal.  Dexa: Last completed on 05/25/2018. Repeat in 4-5 years.  -The BMD measured at Forearm Radius 33% is 0.603 g/cm2 with a T-score of -3.1.  -This patient is considered OSTEOPOROTIC according to [facility]) criteria.  -The scan quality is good.  -Site Region Measured Date Measured Age WHO YA BMD Classification T-score  -AP Spine L1-L4 05/25/2018 63.0 Osteopenia -1.6 0.984 g/cm2  -Dual Femur Total Left 05/25/2018 63.0 years Normal -0.6 0.937 g/cm2  -Right Forearm Radius 33% 05/25/2018 63.0 Osteoporosis -3.1 0.603 g/cm2  Mammogram: Last completed on 03/02/2021. Mammogram scheduled for 03/08/2022. No mammographic evidence of malignancy.  Assessment & Plan:   Problem List Items Addressed This Visit     Hypertension    Well controlled, no changes to meds. Encouraged heart healthy diet such as the DASH diet and exercise as tolerated.       Relevant Orders   Comprehensive metabolic panel (Completed)   Lipid panel (Completed)   TSH (Completed)   Hyperlipidemia    Encourage heart healthy diet such as MIND or DASH diet, increase exercise, avoid trans fats, simple carbohydrates and processed foods, consider a krill or fish or flaxseed oil cap daily.        Relevant Orders   TSH (Completed)   Preventative health care    Patient encouraged to maintain heart healthy diet, regular exercise, adequate sleep. Consider daily probiotics. Take medications as prescribed. Labs ordered and reviewed. Colonoscopy 2021 repeat in 10 years. Dexa 2019 repeat this year. MGM August 2022 repeat every 1-2 years. Pap no longer due to hysterectomy. Reviewed immunizations.       Osteoporosis    Encouraged to get adequate exercise, calcium and vitamin d intake  Relevant Orders   VITAMIN D 25 Hydroxy (Vit-D Deficiency, Fractures) (Completed)   Anemia    Increase leafy greens, consider increased lean red meat and using cast iron cookware. Continue to monitor, report any concerns      Relevant Orders   CBC with Differential/Platelet (Completed)   Sacroiliitis (HCC) - Primary    Left sided. Is referred to sports medicine for further consideration and treatment.       Relevant Orders   Ambulatory referral to Sports Medicine   No orders of the defined types were placed in this encounter.  I, Penni Homans, MD, personally preformed the services described in this documentation.  All medical record entries made by the scribe were at my direction and in my presence.  I have reviewed the chart and discharge instructions (if applicable) and agree that the record reflects my personal performance and is accurate and complete. 03/04/2022  I,Mohammed Iqbal,acting as a scribe for Penni Homans, MD.,have documented all relevant documentation on the behalf of Penni Homans, MD,as directed by  Penni Homans, MD while in the presence of Penni Homans, MD.  Penni Homans, MD

## 2022-03-04 NOTE — Patient Instructions (Addendum)
Tetanus in 2024 unless injured prior to that then boost early  RSV (respiratory syncitial virus) vaccine at pharmacy  New covid booster in late September/ early October  High dose flu shot in September or October  Preventive Care 76 Years and Older, Female Preventive care refers to lifestyle choices and visits with your health care provider that can promote health and wellness. Preventive care visits are also called wellness exams. What can I expect for my preventive care visit? Counseling Your health care provider may ask you questions about your: Medical history, including: Past medical problems. Family medical history. Pregnancy and menstrual history. History of falls. Current health, including: Memory and ability to understand (cognition). Emotional well-being. Home life and relationship well-being. Sexual activity and sexual health. Lifestyle, including: Alcohol, nicotine or tobacco, and drug use. Access to firearms. Diet, exercise, and sleep habits. Work and work Statistician. Sunscreen use. Safety issues such as seatbelt and bike helmet use. Physical exam Your health care provider will check your: Height and weight. These may be used to calculate your BMI (body mass index). BMI is a measurement that tells if you are at a healthy weight. Waist circumference. This measures the distance around your waistline. This measurement also tells if you are at a healthy weight and may help predict your risk of certain diseases, such as type 2 diabetes and high blood pressure. Heart rate and blood pressure. Body temperature. Skin for abnormal spots. What immunizations do I need?  Vaccines are usually given at various ages, according to a schedule. Your health care provider will recommend vaccines for you based on your age, medical history, and lifestyle or other factors, such as travel or where you work. What tests do I need? Screening Your health care provider may recommend  screening tests for certain conditions. This may include: Lipid and cholesterol levels. Hepatitis C test. Hepatitis B test. HIV (human immunodeficiency virus) test. STI (sexually transmitted infection) testing, if you are at risk. Lung cancer screening. Colorectal cancer screening. Diabetes screening. This is done by checking your blood sugar (glucose) after you have not eaten for a while (fasting). Mammogram. Talk with your health care provider about how often you should have regular mammograms. BRCA-related cancer screening. This may be done if you have a family history of breast, ovarian, tubal, or peritoneal cancers. Bone density scan. This is done to screen for osteoporosis. Talk with your health care provider about your test results, treatment options, and if necessary, the need for more tests. Follow these instructions at home: Eating and drinking  Eat a diet that includes fresh fruits and vegetables, whole grains, lean protein, and low-fat dairy products. Limit your intake of foods with high amounts of sugar, saturated fats, and salt. Take vitamin and mineral supplements as recommended by your health care provider. Do not drink alcohol if your health care provider tells you not to drink. If you drink alcohol: Limit how much you have to 0-1 drink a day. Know how much alcohol is in your drink. In the [place] one drink equals one 12 oz bottle of beer (355 mL), one 5 oz glass of wine (148 mL), or one 1 oz glass of hard liquor (44 mL). Lifestyle Brush your teeth every morning and night with fluoride toothpaste. Floss one time each day. Exercise for at least 30 minutes 5 or more days each week. Do not use any products that contain nicotine or tobacco. These products include cigarettes, chewing tobacco, and vaping devices, such as e-cigarettes. If you need  help quitting, ask your health care provider. Do not use drugs. If you are sexually active, practice safe sex. Use a condom or other  form of protection in order to prevent STIs. Take aspirin only as told by your health care provider. Make sure that you understand how much to take and what form to take. Work with your health care provider to find out whether it is safe and beneficial for you to take aspirin daily. Ask your health care provider if you need to take a cholesterol-lowering medicine (statin). Find healthy ways to manage stress, such as: Meditation, yoga, or listening to music. Journaling. Talking to a trusted person. Spending time with friends and family. Minimize exposure to UV radiation to reduce your risk of skin cancer. Safety Always wear your seat belt while driving or riding in a vehicle. Do not drive: If you have been drinking alcohol. Do not ride with someone who has been drinking. When you are tired or distracted. While texting. If you have been using any mind-altering substances or drugs. Wear a helmet and other protective equipment during sports activities. If you have firearms in your house, make sure you follow all gun safety procedures. What's next? Visit your health care provider once a year for an annual wellness visit. Ask your health care provider how often you should have your eyes and teeth checked. Stay up to date on all vaccines. This information is not intended to replace advice given to you by your health care provider. Make sure you discuss any questions you have with your health care provider. Document Revised: 12/24/2020 Document Reviewed: 12/24/2020 Elsevier Patient Education  [place].

## 2022-03-04 NOTE — Assessment & Plan Note (Signed)
Encourage heart healthy diet such as MIND or DASH diet, increase exercise, avoid trans fats, simple carbohydrates and processed foods, consider a krill or fish or flaxseed oil cap daily.  °

## 2022-03-04 NOTE — Assessment & Plan Note (Signed)
Encouraged to get adequate exercise, calcium and vitamin d intake 

## 2022-03-04 NOTE — Assessment & Plan Note (Signed)
Increase leafy greens, consider increased lean red meat and using cast iron cookware. Continue to monitor, report any concerns 

## 2022-03-04 NOTE — Assessment & Plan Note (Signed)
Well controlled, no changes to meds. Encouraged heart healthy diet such as the DASH diet and exercise as tolerated.  °

## 2022-03-05 LAB — CBC WITH DIFFERENTIAL/PLATELET
Basophils Absolute: 0 10*3/uL (ref 0.0–0.1)
Basophils Relative: 0.9 % (ref 0.0–3.0)
Eosinophils Absolute: 0.2 10*3/uL (ref 0.0–0.7)
Eosinophils Relative: 3.3 % (ref 0.0–5.0)
HCT: 39.8 % (ref 36.0–46.0)
Hemoglobin: 13.3 g/dL (ref 12.0–15.0)
Lymphocytes Relative: 22.8 % (ref 12.0–46.0)
Lymphs Abs: 1.3 10*3/uL (ref 0.7–4.0)
MCHC: 33.4 g/dL (ref 30.0–36.0)
MCV: 88.4 fl (ref 78.0–100.0)
Monocytes Absolute: 0.3 10*3/uL (ref 0.1–1.0)
Monocytes Relative: 6 % (ref 3.0–12.0)
Neutro Abs: 3.7 10*3/uL (ref 1.4–7.7)
Neutrophils Relative %: 67 % (ref 43.0–77.0)
Platelets: 222 10*3/uL (ref 150.0–400.0)
RBC: 4.51 Mil/uL (ref 3.87–5.11)
RDW: 13.2 % (ref 11.5–15.5)
WBC: 5.6 10*3/uL (ref 4.0–10.5)

## 2022-03-05 LAB — LIPID PANEL
Cholesterol: 178 mg/dL (ref 0–200)
HDL: 48.8 mg/dL (ref >39.00)
LDL Cholesterol: 91 mg/dL (ref 0–99)
NonHDL: 129.41
Total CHOL/HDL Ratio: 4
Triglycerides: 192 mg/dL — ABNORMAL HIGH (ref 0.0–149.0)
VLDL: 38.4 mg/dL (ref 0.0–40.0)

## 2022-03-05 LAB — COMPREHENSIVE METABOLIC PANEL
ALT: 15 U/L (ref 0–35)
AST: 17 U/L (ref 0–37)
Albumin: 4.3 g/dL (ref 3.5–5.2)
Alkaline Phosphatase: 61 U/L (ref 39–117)
BUN: 20 mg/dL (ref 6–23)
CO2: 29 mEq/L (ref 19–32)
Calcium: 9.7 mg/dL (ref 8.4–10.5)
Chloride: 100 mEq/L (ref 96–112)
Creatinine, Ser: 0.83 mg/dL (ref 0.40–1.20)
GFR: 73.27 mL/min (ref >60.00)
Glucose, Bld: 87 mg/dL (ref 70–99)
Potassium: 4.2 mEq/L (ref 3.5–5.1)
Sodium: 138 mEq/L (ref 135–145)
Total Bilirubin: 0.3 mg/dL (ref 0.2–1.2)
Total Protein: 6.8 g/dL (ref 6.0–8.3)

## 2022-03-05 LAB — TSH: TSH: 0.57 u[IU]/mL (ref 0.35–5.50)

## 2022-03-05 LAB — VITAMIN D 25 HYDROXY (VIT D DEFICIENCY, FRACTURES): VITD: 68.12 ng/mL (ref 30.00–100.00)

## 2022-03-05 NOTE — Assessment & Plan Note (Signed)
Left sided. Is referred to sports medicine for further consideration and treatment.

## 2022-03-08 ENCOUNTER — Ambulatory Visit (HOSPITAL_BASED_OUTPATIENT_CLINIC_OR_DEPARTMENT_OTHER)
Admission: RE | Admit: 2022-03-08 | Discharge: 2022-03-08 | Disposition: A | Discharge status: Home / Self Care | Payer: No Typology Code available for payment source | Source: Ambulatory Visit | Attending: Family Medicine | Admitting: Family Medicine

## 2022-03-08 ENCOUNTER — Encounter (HOSPITAL_BASED_OUTPATIENT_CLINIC_OR_DEPARTMENT_OTHER): Payer: Self-pay

## 2022-03-08 DIAGNOSIS — Z1231 Encounter for screening mammogram for malignant neoplasm of breast: Secondary | ICD-10-CM | POA: Insufficient documentation

## 2022-03-09 ENCOUNTER — Ambulatory Visit
Admission: RE | Admit: 2022-03-09 | Discharge: 2022-03-09 | Disposition: A | Discharge status: Home / Self Care | Payer: No Typology Code available for payment source | Source: Ambulatory Visit | Attending: Sports Medicine | Admitting: Sports Medicine

## 2022-03-09 ENCOUNTER — Ambulatory Visit (INDEPENDENT_AMBULATORY_CARE_PROVIDER_SITE_OTHER): Payer: No Typology Code available for payment source | Admitting: Sports Medicine

## 2022-03-09 ENCOUNTER — Encounter: Payer: Self-pay | Admitting: Family Medicine

## 2022-03-09 VITALS — BP 170/91 | Ht 67.0 in | Wt 160.0 lb

## 2022-03-09 DIAGNOSIS — M533 Sacrococcygeal disorders, not elsewhere classified: Secondary | ICD-10-CM

## 2022-03-09 DIAGNOSIS — G8929 Other chronic pain: Secondary | ICD-10-CM

## 2022-03-09 DIAGNOSIS — M545 Low back pain, unspecified: Secondary | ICD-10-CM | POA: Diagnosis not present

## 2022-03-10 NOTE — Progress Notes (Signed)
   Subjective:    Patient ID: Ann Barton, female    DOB: 1955-12-11, 66 y.o.   MRN: [id number]  HPI chief complaint: Low back pain  Ann Barton is a very pleasant 66 year old female that comes in today complaining of 2 months of left-sided low back pain.  She denies any trauma.  She has had intermittent pain similar to this in the past.  However this current episode seems to be lasting longer.  She endorses a catching type pain in the left side of her low back when going from a flexed position at the weight to a standing position.  After the catch she has symptom relief.  She denies any radiating pain down the left leg.  No numbness or tingling.  No groin pain.  This happens on a daily basis.  She been told in the past that she has arthritis in her back.  She was recently evaluated by her PCP who thought she may have sacroiliitis.  She was therefore referred to our office for evaluation and treatment for this.  She takes over-the-counter Tylenol which does help some.  No prior low back surgeries.  Past medical history reviewed Medications reviewed Allergies reviewed    Review of Systems As above    Objective:   Physical Exam  Well-developed, well-nourished.  No acute distress.  Sitting comfortably in the exam room.  Lumbar spine: She has painless flexion but she does have left-sided low back pain when going from a flexed to an extended position.  Once she reaches neutral however, she has no pain with lumbar extension and in fact this feels better to her.  No tenderness to palpation.  No spasm.  Neurological exam shows a negative straight leg raise bilaterally.  She has good strength in both lower extremities and good pulses.  Left hip: Smooth painless hip range of motion with a negative logroll.  Negative FADIR.  Negative FABER.    Assessment & Plan:   Left-sided low back pain  Her symptoms may be facet mediated.  She has a negative Corky Sox which makes me less suspicious for SI joint pain.  I  will start with getting x-rays of her lumbar spine.  She has some Mobic at home and I have recommended that she start taking it daily for the next several days.  I will follow-up with her via telephone in approximately 3 days with the results of her x-rays and we will see how she is responding to the meloxicam.  I did discuss the possibility of a diagnostic/therapeutic lumbar facet injection but we would need to get a preprocedural MRI first.  We will discuss this further during my phone follow-up later this week.  We will also discuss formal physical therapy.  This note was dictated using Dragon naturally speaking software and may contain errors in syntax, spelling, or content which have not been identified prior to signing this note.

## 2022-03-12 ENCOUNTER — Ambulatory Visit (INDEPENDENT_AMBULATORY_CARE_PROVIDER_SITE_OTHER): Payer: No Typology Code available for payment source | Admitting: *Deleted

## 2022-03-12 ENCOUNTER — Other Ambulatory Visit: Payer: Self-pay | Admitting: *Deleted

## 2022-03-12 ENCOUNTER — Telehealth: Payer: Self-pay | Admitting: Sports Medicine

## 2022-03-12 DIAGNOSIS — Z0000 Encounter for general adult medical examination without abnormal findings: Secondary | ICD-10-CM | POA: Diagnosis not present

## 2022-03-12 DIAGNOSIS — M545 Low back pain, unspecified: Secondary | ICD-10-CM

## 2022-03-12 NOTE — Patient Instructions (Signed)
Ann Barton , Thank you for taking time to come for your Medicare Wellness Visit. I appreciate your ongoing commitment to your health goals. Please review the following plan we discussed and let me know if I can assist you in the future.   These are the goals we discussed:  Goals   None     This is a list of the screening recommended for you and due dates:  Health Maintenance  Topic Date Due   Hepatitis C Screening: USPSTF Recommendation to screen - Ages 27-79 yo.  Never done   COVID-19 Vaccine (6 - Pfizer series) 07/09/2022*   Flu Shot  10/11/2022*   Tetanus Vaccine  05/15/2023   Mammogram  03/08/2024   Colon Cancer Screening  01/21/2030   Pneumonia Vaccine  Completed   DEXA scan (bone density measurement)  Completed   Zoster (Shingles) Vaccine  Completed   HPV Vaccine  Aged Out  *Topic was postponed. The date shown is not the original due date.      Next appointment: Follow up in one year for your annual wellness visit    Preventive Care 65 Years and Older, Female Preventive care refers to lifestyle choices and visits with your health care provider that can promote health and wellness. What does preventive care include? A yearly physical exam. This is also called an annual well check. Dental exams once or twice a year. Routine eye exams. Ask your health care provider how often you should have your eyes checked. Personal lifestyle choices, including: Daily care of your teeth and gums. Regular physical activity. Eating a healthy diet. Avoiding tobacco and drug use. Limiting alcohol use. Practicing safe sex. Taking low-dose aspirin every day. Taking vitamin and mineral supplements as recommended by your health care provider. What happens during an annual well check? The services and screenings done by your health care provider during your annual well check will depend on your age, overall health, lifestyle risk factors, and family history of disease. Counseling  Your  health care provider may ask you questions about your: Alcohol use. Tobacco use. Drug use. Emotional well-being. Home and relationship well-being. Sexual activity. Eating habits. History of falls. Memory and ability to understand (cognition). Work and work Statistician. Reproductive health. Screening  You may have the following tests or measurements: Height, weight, and BMI. Blood pressure. Lipid and cholesterol levels. These may be checked every 5 years, or more frequently if you are over 29 years old. Skin check. Lung cancer screening. You may have this screening every year starting at age 55 if you have a 30-pack-year history of smoking and currently smoke or have quit within the past 15 years. Fecal occult blood test (FOBT) of the stool. You may have this test every year starting at age 10. Flexible sigmoidoscopy or colonoscopy. You may have a sigmoidoscopy every 5 years or a colonoscopy every 10 years starting at age 20. Hepatitis C blood test. Hepatitis B blood test. Sexually transmitted disease (STD) testing. Diabetes screening. This is done by checking your blood sugar (glucose) after you have not eaten for a while (fasting). You may have this done every 1-3 years. Bone density scan. This is done to screen for osteoporosis. You may have this done starting at age 42. Mammogram. This may be done every 1-2 years. Talk to your health care provider about how often you should have regular mammograms. Talk with your health care provider about your test results, treatment options, and if necessary, the need for more tests. Vaccines  Your health care provider may recommend certain vaccines, such as: Influenza vaccine. This is recommended every year. Tetanus, diphtheria, and acellular pertussis (Tdap, Td) vaccine. You may need a Td booster every 10 years. Zoster vaccine. You may need this after age 33. Pneumococcal 13-valent conjugate (PCV13) vaccine. One dose is recommended after age  70. Pneumococcal polysaccharide (PPSV23) vaccine. One dose is recommended after age 43. Talk to your health care provider about which screenings and vaccines you need and how often you need them. This information is not intended to replace advice given to you by your health care provider. Make sure you discuss any questions you have with your health care provider. Document Released: 07/25/2015 Document Revised: 03/17/2016 Document Reviewed: 04/29/2015 Elsevier Interactive Patient Education  2017 [employer] Prevention in the Home Falls can cause injuries. They can happen to people of all ages. There are many things you can do to make your home safe and to help prevent falls. What can I do on the outside of my home? Regularly fix the edges of walkways and driveways and fix any cracks. Remove anything that might make you trip as you walk through a door, such as a raised step or threshold. Trim any bushes or trees on the path to your home. Use bright outdoor lighting. Clear any walking paths of anything that might make someone trip, such as rocks or tools. Regularly check to see if handrails are loose or broken. Make sure that both sides of any steps have handrails. Any raised decks and porches should have guardrails on the edges. Have any leaves, snow, or ice cleared regularly. Use sand or salt on walking paths during winter. Clean up any spills in your garage right away. This includes oil or grease spills. What can I do in the bathroom? Use night lights. Install grab bars by the toilet and in the tub and shower. Do not use towel bars as grab bars. Use non-skid mats or decals in the tub or shower. If you need to sit down in the shower, use a plastic, non-slip stool. Keep the floor dry. Clean up any water that spills on the floor as soon as it happens. Remove soap buildup in the tub or shower regularly. Attach bath mats securely with double-sided non-slip rug tape. Do not have throw  rugs and other things on the floor that can make you trip. What can I do in the bedroom? Use night lights. Make sure that you have a light by your bed that is easy to reach. Do not use any sheets or blankets that are too big for your bed. They should not hang down onto the floor. Have a firm chair that has side arms. You can use this for support while you get dressed. Do not have throw rugs and other things on the floor that can make you trip. What can I do in the kitchen? Clean up any spills right away. Avoid walking on wet floors. Keep items that you use a lot in easy-to-reach places. If you need to reach something above you, use a strong step stool that has a grab bar. Keep electrical cords out of the way. Do not use floor polish or wax that makes floors slippery. If you must use wax, use non-skid floor wax. Do not have throw rugs and other things on the floor that can make you trip. What can I do with my stairs? Do not leave any items on the stairs. Make sure that there are  handrails on both sides of the stairs and use them. Fix handrails that are broken or loose. Make sure that handrails are as long as the stairways. Check any carpeting to make sure that it is firmly attached to the stairs. Fix any carpet that is loose or worn. Avoid having throw rugs at the top or bottom of the stairs. If you do have throw rugs, attach them to the floor with carpet tape. Make sure that you have a light switch at the top of the stairs and the bottom of the stairs. If you do not have them, ask someone to add them for you. What else can I do to help prevent falls? Wear shoes that: Do not have high heels. Have rubber bottoms. Are comfortable and fit you well. Are closed at the toe. Do not wear sandals. If you use a stepladder: Make sure that it is fully opened. Do not climb a closed stepladder. Make sure that both sides of the stepladder are locked into place. Ask someone to hold it for you, if  possible. Clearly mark and make sure that you can see: Any grab bars or handrails. First and last steps. Where the edge of each step is. Use tools that help you move around (mobility aids) if they are needed. These include: Canes. Walkers. Scooters. Crutches. Turn on the lights when you go into a dark area. Replace any light bulbs as soon as they burn out. Set up your furniture so you have a clear path. Avoid moving your furniture around. If any of your floors are uneven, fix them. If there are any pets around you, be aware of where they are. Review your medicines with your doctor. Some medicines can make you feel dizzy. This can increase your chance of falling. Ask your doctor what other things that you can do to help prevent falls. This information is not intended to replace advice given to you by your health care provider. Make sure you discuss any questions you have with your health care provider. Document Released: 04/24/2009 Document Revised: 12/04/2015 Document Reviewed: 08/02/2014 Elsevier Interactive Patient Education  2017 [employer].

## 2022-03-12 NOTE — Telephone Encounter (Signed)
  I spoke with Ann Barton on the phone today after reviewing x-rays of her lumbar spine.  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  Disc spaces are fairly well-preserved.  Chronic and stable upper endplate depressions are seen at L2 and L4.  I believe her symptoms are facet mediated.  I discussed work-up and treatment options including physical therapy and an MRI in anticipation of diagnostic/therapeutic facet injections.  She would like to start with physical therapy first.  She would like to do this at [facility] so I will refer her to that office.  If she continues to have pain despite physical therapy then we will reconsider merits of MRI and facet injections.  Of note, she has not noticed any benefit from the meloxicam yet.  She will discontinue it in the next day or 2 if she does not notice any improvement.  This note was dictated using Dragon naturally speaking software and may contain errors in syntax, spelling, or content which have not been identified prior to signing this note.

## 2022-03-12 NOTE — Progress Notes (Signed)
Subjective:   Ann Barton is a 66 y.o. female who presents for Medicare Annual (Subsequent) preventive examination. I connected with  Dulce Sellar on 03/12/22 by a audio enabled telemedicine application and verified that I am speaking with the correct person using two identifiers.  Patient Location: Home  Provider Location: Home Office  I discussed the limitations of evaluation and management by telemedicine. The patient expressed understanding and agreed to proceed.    Review of Systems    Defer to PCP Cardiac Risk Factors include: advanced age (>76mn, >>54women);hypertension     Objective:    There were no vitals filed for this visit. There is no height or weight on file to calculate BMI.     03/12/2022    9:03 AM 03/11/2020    8:59 AM 11/06/2019    8:00 PM 01/11/2018    1:15 PM  Advanced Directives  Does Patient Have a Medical Advance Directive? Yes Yes Yes Yes  Type of AParamed[place]of [place] will Living will;Healthcare Power of [place]orth Rock [place] will [place]  Does patient want to make changes to medical advance directive? No - Patient declined  No - Patient declined   Copy of [place] Chart? No - copy requested No - copy requested  No - copy requested    Current Medications (verified) Outpatient Encounter Medications as of 03/12/2022  Medication Sig   Calcium Carbonate-Vit D-Min (CALCIUM 600+D PLUS MINERALS) 600-400 MG-UNIT TABS Take by mouth.   Cholecalciferol (VITAMIN D3) 5000 UNITS CAPS Take 1 capsule by mouth daily.    Coenzyme Q10 (COQ-10) 200 MG CAPS Take 1 tablet by mouth daily.    estradiol (ESTRACE) 0.1 MG/GM vaginal cream INSERT 1 GRAM VAGINALLY EVERY NIGHT FOR ONE WEEK, THEN TWICE WEEKLY   loratadine (CLARITIN) 10 MG tablet Take 10 mg by mouth daily.   metoprolol succinate (TOPROL-XL) 100 MG 24 hr tablet TAKE 1 TABLET BY MOUTH EVERY DAY WITH OR  IMMEDIATELY FOLLOWING A MEAL   Multiple Vitamin (MULTIVITAMIN) tablet Take 1 tablet by mouth daily.   Probiotic Product (PROBIOTIC DAILY PO) Take 1 tablet by mouth daily.    Red Yeast Rice Extract (RED YEAST RICE PO) Take 1 capsule by mouth daily.   triamterene-hydrochlorothiazide (MAXZIDE-25) 37.5-25 MG tablet TAKE 1 TABLET BY MOUTH EVERY DAY   No facility-administered encounter medications on file as of 03/12/2022.    Allergies (verified) Lisinopril   History: Past Medical History:  Diagnosis Date   Allergy    seasonal   Arthritis    left hip, low back   Atrophic vaginitis 01/25/2011   Bladder prolapse, female, acquired 05/16/2013   Blood transfusion without reported diagnosis    Chicken pox as a child   Compression fracture of fourth lumbar vertebra ([place] 04/25/[place]   Cough 04/19/2012   Diverticulosis    Female sexual dysfunction 01/25/2011   Gallstones    GI bleed    Goiter    Hypertension    Measles as a child   Mumps as a child   Neutropenia 03/10/2011   Osteoporosis    Other and unspecified hyperlipidemia 08/23/2013   Other malaise and fatigue 08/26/2013   Pharyngitis, acute 03/09/2011   Urinary frequency 04/27/2011   Vaginitis 04/27/2011   Past Surgical History:  Procedure Laterality Date   BIOPSY THYROID     FNA, benign   BREAST BIOPSY Left    FNA, benign   BREAST EXCISIONAL  BIOPSY Left    CHOLECYSTECTOMY  1998   COLONOSCOPY     more than 10 years ago. Michela Pitcher it was done at [facility]   ERCP  [place]   with the ERCP as well for her gallbladder   HERNIA REPAIR Right    right, inguinal   in grown toenails removed     partial, b/l   laser surgery on vericose veins     b/l   NASAL SEPTUM SURGERY  2015   skin cancer removal face     TONSILLECTOMY  as a child   [place]   done for polapse   VARICOSE VEIN SURGERY     Family History  Problem Relation Age of Onset   Atrial fibrillation Mother    Obesity  Mother    Diabetes Mother 29       Type 2   Arthritis Mother        in right knee   Heart disease Mother        atrial fibrillation, CHF   Hernia Mother        never recovered from repair   Hypertension Father    Ulcers Father    Pulmonary fibrosis Father    Atrial fibrillation Brother    Colon cancer Maternal Aunt    Diabetes Maternal Grandmother        type 2?   Heart attack Maternal Grandmother    Hearing loss Maternal Grandmother        MI   Parkinsonism Maternal Grandfather    Stroke Paternal Grandmother    Esophageal cancer Neg Hx    Social History   Socioeconomic History   Marital status: Widowed    Spouse name: Not on file   Number of children: 2   Years of education: Not on file   Highest education level: Not on file  Occupational History   Occupation: NICU    Employer: [employer]  Tobacco Use   Smoking status: Former    Packs/day: 1.00    Years: 5.00    Total pack years: 5.00    Types: Cigarettes    Start date: 38    Quit date: 07/12/1976    Years since quitting: 45.6   Smokeless tobacco: Never  Vaping Use   Vaping Use: Never used  Substance and Sexual Activity   Alcohol use: Yes    Comment: occasionally   Drug use: No   Sexual activity: Not Currently    Partners: Male    Birth control/protection: Surgical    Comment: hysterectomy  Other Topics Concern   Not on file  Social History Narrative   Not on file   Social Determinants of Health   Financial Resource Strain: Low Risk  (03/12/2022)   Overall Financial Resource Strain (CARDIA)    Difficulty of Paying Living Expenses: Not hard at all  Food Insecurity: No Food Insecurity (03/12/2022)   Hunger Vital Sign    Worried About Running Out of Food in the Last Year: Never true    [place] in the Last Year: Never true  Transportation Needs: No Transportation Needs (03/12/2022)   PRAPARE - Hydrologist (Medical): No    Lack of Transportation (Non-Medical): No   Physical Activity: Insufficiently Active (03/12/2022)   Exercise Vital Sign    Days of Exercise per Week: 3 days    Minutes of Exercise per Session: 30 min  Stress: No Stress  Concern Present (03/12/2022)   [place]    Feeling of Stress : Not at all  Social Connections: Moderately Isolated (03/12/2022)   Social Connection and Isolation Panel [NHANES]    Frequency of Communication with Friends and Family: More than three times a week    Frequency of Social Gatherings with Friends and Family: More than three times a week    Attends Religious Services: Never    Marine scientist or Organizations: Yes    Attends Music therapist: More than 4 times per year    Marital Status: Widowed    Tobacco Counseling Counseling given: Not Answered   Clinical Intake:  Pre-visit preparation completed: Yes  Pain : No/denies pain     Diabetes: No  How often do you need to have someone help you when you read instructions, pamphlets, or other written materials from your doctor or pharmacy?: 1 - Never  Diabetic? No  Interpreter Needed?: No  Information entered by :: Beatris Ship, [place]   Activities of Daily Living    03/12/2022    9:09 AM  In your present state of health, do you have any difficulty performing the following activities:  Hearing? 0  Vision? 0  Difficulty concentrating or making decisions? 0  Walking or climbing stairs? 0  Dressing or bathing? 0  Doing errands, shopping? 0  Preparing Food and eating ? N  Using the Toilet? N  In the past six months, have you accidently leaked urine? N  Do you have problems with loss of bowel control? N  Managing your Medications? N  Managing your Finances? N  Housekeeping or managing your Housekeeping? N    Patient Care Team: Mosie Lukes, MD as PCP - General (Family Medicine)  Indicate any recent Medical Services you may have received from other than  [facility] providers in the past year (date may be approximate).     Assessment:   This is a routine wellness examination for Jamaiya.  Hearing/Vision screen No results found.  Dietary issues and exercise activities discussed: Current Exercise Habits: Home exercise routine, Type of exercise: strength training/weights, Time (Minutes): 30, Frequency (Times/Week): 3, Weekly Exercise (Minutes/Week): 90, Intensity: Mild, Exercise limited by: None identified   Goals Addressed   None    Depression Screen    03/12/2022    9:05 AM 03/04/2022    1:18 PM 03/02/2021    3:28 PM 08/28/2019   10:34 AM 05/25/2018   10:57 AM 05/19/2017    1:33 PM 05/16/2013    2:26 PM  PHQ 2/9 Scores  PHQ - 2 Score 0 0 0 0 2 0 0  PHQ- 9 Score  0   8      Fall Risk    03/12/2022    9:03 AM 03/04/2022    1:18 PM 03/02/2021    3:28 PM 05/25/2018   10:58 AM 05/19/2017    1:33 PM  Fall Risk   Falls in the past year? 0 0 0 0 No  Number falls in past yr: 0 0 0    Injury with Fall? 0 0 0    Risk for fall due to : No Fall Risks  No Fall Risks    Follow up Falls evaluation completed  Falls evaluation completed      [place]:  Any stairs in or around the home? No  If so, are there any without handrails?  No stairs  Home free of loose throw rugs in walkways, pet beds, electrical cords, etc? Yes  Adequate lighting in your home to reduce risk of falls? Yes   ASSISTIVE DEVICES UTILIZED TO PREVENT FALLS:  Life alert? No  Use of a cane, walker or w/c? No  Grab bars in the bathroom? Yes  Shower chair or bench in shower? No  Elevated toilet seat or a handicapped toilet? Yes    Cognitive Function:        03/12/2022    9:13 AM  6CIT Screen  What Year? 0 points  What month? 0 points  What time? 0 points  Count back from 20 0 points  Months in reverse 0 points  Repeat phrase 0 points  Total Score 0 points    Immunizations Immunization History  Administered Date(s) Administered    Influenza Split 04/11/2012, 04/11/2020   Influenza,inj,Quad PF,6+ Mos 04/16/2019   Influenza-Unspecified 04/11/2013, 04/11/2014, 03/28/2017, 04/18/2017, 04/16/2019, 03/23/2021   PFIZER(Purple Top)SARS-COV-2 Vaccination 07/02/2019, 07/23/2019, 04/08/2020, 10/23/2020   Pfizer Covid-19 Vaccine Bivalent Booster 23yr & up 04/08/2021   Pneumococcal Conjugate-13 05/14/2013   Pneumococcal Polysaccharide-23 06/26/2020   Tdap 05/14/2013   Zoster Recombinat (Shingrix) 01/29/2021   Zoster, Unspecified 07/17/2021    TDAP status: Up to date  Flu Vaccine status: Up to date  Pneumococcal vaccine status: Up to date  Covid-19 vaccine status: Information provided on how to obtain vaccines.   Qualifies for Shingles Vaccine? Yes   Zostavax completed No   Shingrix Completed?: Yes  Screening Tests Health Maintenance  Topic Date Due   Hepatitis C Screening  Never done   COVID-19 Vaccine (6 - Pfizer series) 07/09/2022 (Originally 08/08/2021)   INFLUENZA VACCINE  10/11/2022 (Originally 02/09/2022)   TETANUS/TDAP  05/15/2023   MAMMOGRAM  03/08/2024   COLONOSCOPY (Pts 45-465yrInsurance coverage will need to be confirmed)  01/21/2030   Pneumonia Vaccine 6516Years old  Completed   DEXA SCAN  Completed   Zoster Vaccines- Shingrix  Completed   HPV VACCINES  Aged Out    Health Maintenance  Health Maintenance Due  Topic Date Due   Hepatitis C Screening  Never done    Colorectal cancer screening: Type of screening: Colonoscopy. Completed 01/22/20. Repeat every 10 years  Mammogram status: Completed 03/08/22. Repeat every year  Bone Density status: Completed 05/25/18. Results reflect: Bone density results: OSTEOPOROSIS. Repeat every 2 years.  Lung Cancer Screening: (Low Dose CT Chest recommended if Age 67-80ears, 30 pack-year currently smoking OR have quit w/in 15years.) does not qualify.   Lung Cancer Screening Referral: n/a  Additional Screening:  Hepatitis C Screening: does qualify; Completed  N/a  Vision Screening: Recommended annual ophthalmology exams for early detection of glaucoma and other disorders of the eye. Is the patient up to date with their annual eye exam?  Yes  Who is the provider or what is the name of the office in which the patient attends annual eye exams? Dr. JoRonnald Rampf pt is not established with a provider, would they like to be referred to a provider to establish care? No .   Dental Screening: Recommended annual dental exams for proper oral hygiene  Community Resource Referral / Chronic Care Management: CRR required this visit?  No   CCM required this visit?  No      Plan:     I have personally reviewed and noted the following in the patient's chart:   Medical and social history Use of alcohol, tobacco or illicit drugs  Current medications and  supplements including opioid prescriptions. Patient is not currently taking opioid prescriptions. Functional ability and status Nutritional status Physical activity Advanced directives List of other physicians Hospitalizations, surgeries, and ER visits in previous 12 months Vitals Screenings to include cognitive, depression, and falls Referrals and appointments  In addition, I have reviewed and discussed with patient certain preventive protocols, quality metrics, and best practice recommendations. A written personalized care plan for preventive services as well as general preventive health recommendations were provided to patient.   Due to this being a telephonic visit, the after visit summary with patients personalized plan was offered to patient via mail or my-chart. Patient would like to access on my-chart   Beatris Ship, [place]   03/12/2022   Nurse Notes: None

## 2022-03-31 DIAGNOSIS — M545 Low back pain, unspecified: Secondary | ICD-10-CM | POA: Insufficient documentation

## 2022-03-31 DIAGNOSIS — M5451 Vertebrogenic low back pain: Secondary | ICD-10-CM | POA: Diagnosis not present

## 2022-04-07 ENCOUNTER — Other Ambulatory Visit: Payer: Self-pay | Admitting: Family Medicine

## 2022-04-15 DIAGNOSIS — I10 Essential (primary) hypertension: Secondary | ICD-10-CM | POA: Diagnosis not present

## 2022-04-15 DIAGNOSIS — Z6825 Body mass index (BMI) 25.0-25.9, adult: Secondary | ICD-10-CM | POA: Diagnosis not present

## 2022-04-15 DIAGNOSIS — F17211 Nicotine dependence, cigarettes, in remission: Secondary | ICD-10-CM | POA: Diagnosis not present

## 2022-04-15 DIAGNOSIS — E663 Overweight: Secondary | ICD-10-CM | POA: Diagnosis not present

## 2022-04-15 DIAGNOSIS — Z008 Encounter for other general examination: Secondary | ICD-10-CM | POA: Diagnosis not present

## 2022-05-24 ENCOUNTER — Other Ambulatory Visit: Payer: Self-pay | Admitting: Obstetrics and Gynecology

## 2022-05-25 ENCOUNTER — Encounter: Payer: Self-pay | Admitting: Obstetrics and Gynecology

## 2022-05-25 NOTE — Telephone Encounter (Signed)
Medication refill request: Estradiol  Last AEX:  03-12-21 JJ  Next OV: 07-21-22  Last MMG (if hormonal medication request): 03-08-22 density B/BIRADS 1 negative  Refill authorized: Today, please advise.   Medication pended for #42.5g, 0RF. Please refill if appropriate.

## 2022-05-31 NOTE — Telephone Encounter (Signed)
Confirmed with [facility], Rx on file for estradiol cream 90 day supply. Reviewed current Good RX coupon.   Call returned to patient. Advised Rx on file ran as 90 day supply. May want to consider shopping pharmacies for future refills, may also consider compounded RX. No additional assistance needed at this time. Patient appreciative of call.   Routing to provider for final review. Patient is agreeable to disposition. Will close encounter.

## 2022-05-31 NOTE — Telephone Encounter (Signed)
Left message to call Merrill Villarruel, RN at [facility], [phone].  

## 2022-06-10 DIAGNOSIS — Z85828 Personal history of other malignant neoplasm of skin: Secondary | ICD-10-CM | POA: Diagnosis not present

## 2022-06-10 DIAGNOSIS — L814 Other melanin hyperpigmentation: Secondary | ICD-10-CM | POA: Diagnosis not present

## 2022-06-10 DIAGNOSIS — L821 Other seborrheic keratosis: Secondary | ICD-10-CM | POA: Diagnosis not present

## 2022-06-10 DIAGNOSIS — D225 Melanocytic nevi of trunk: Secondary | ICD-10-CM | POA: Diagnosis not present

## 2022-06-10 DIAGNOSIS — Z08 Encounter for follow-up examination after completed treatment for malignant neoplasm: Secondary | ICD-10-CM | POA: Diagnosis not present

## 2022-07-21 ENCOUNTER — Ambulatory Visit (INDEPENDENT_AMBULATORY_CARE_PROVIDER_SITE_OTHER): Payer: No Typology Code available for payment source | Admitting: Obstetrics and Gynecology

## 2022-07-21 ENCOUNTER — Encounter: Payer: Self-pay | Admitting: Obstetrics and Gynecology

## 2022-07-21 VITALS — BP 132/80 | HR 66 | Wt 168.0 lb

## 2022-07-21 DIAGNOSIS — Z4689 Encounter for fitting and adjustment of other specified devices: Secondary | ICD-10-CM

## 2022-07-21 NOTE — Progress Notes (Signed)
GYNECOLOGY  VISIT   HPI: 67 y.o.   Widowed White or Caucasian Not Hispanic or Latino  female   [phone] with No LMP recorded. Patient has had a hysterectomy.   here for a pessary check. H/O hysterectomy.  She has a #4 ring pessary to control her cystocele. She takes the pessary out at night and replaces it in the am. Uses estrogen cream 2 x a week at hs. Not sexually active. No vaginal bleeding. No abnormal vaginal discharge. Normal bowel and bladder function.   GYNECOLOGIC HISTORY: No LMP recorded. Patient has had a hysterectomy. Contraception:PMP Menopausal hormone therapy: vaginal estrogen        OB History     Gravida  2   Para  2   Term  2   Preterm      AB      Living  2      SAB      IAB      Ectopic      Multiple      Live Births                 Patient Active Problem List   Diagnosis Date Noted   Sacroiliitis ([place]) 03/04/2022   Left hip pain 03/02/2021   Pain due to onychomycosis of toenail of left foot 10/14/2020   IDA (iron deficiency anemia) 03/11/2020   Lower GI bleed 11/06/2019   Anemia 11/06/2019   Tinnitus of both ears 08/28/2019   Pressure sensation in both ears 08/28/2019   Bilateral carotid bruits 08/28/2019   Osteoporosis 08/28/2019   Muscle cramps 08/28/2019   Compression fracture of L4 vertebra (HCC) 11/07/2018   Low back pain 05/28/2018   Insomnia 05/23/2017   Multiple food allergies 05/19/2017   Hyperlipidemia 08/23/2013   Bladder prolapse, female, acquired 05/16/2013   Cough 04/19/2012   Hypokalemia 03/10/2011   Neutropenia ([place]) 03/10/2011   Arthritis 01/25/2011   Atrophic vaginitis 01/25/2011   Preventative health care 01/25/2011   Diverticulosis 01/25/2011   Seasonal and perennial allergic rhinitis    Hypertension    Chicken pox    Mumps    Measles    Goiter     Past Medical History:  Diagnosis Date   Allergy    seasonal   Arthritis    left hip, low back   Atrophic vaginitis 01/25/2011   Bladder prolapse,  female, acquired 05/16/2013   Blood transfusion without reported diagnosis    Chicken pox as a child   Compression fracture of fourth lumbar vertebra ([place]) 04/25/2018   Cough 04/19/2012   Diverticulosis    Female sexual dysfunction 01/25/2011   Gallstones    GI bleed    Goiter    Hypertension    Measles as a child   Mumps as a child   Neutropenia 03/10/2011   Osteoporosis    Other and unspecified hyperlipidemia 08/23/2013   Other malaise and fatigue 08/26/2013   Pharyngitis, acute 03/09/2011   Urinary frequency 04/27/2011   Vaginitis 04/27/2011    Past Surgical History:  Procedure Laterality Date   BIOPSY THYROID     FNA, benign   BREAST BIOPSY Left    FNA, benign   BREAST EXCISIONAL BIOPSY Left    CHOLECYSTECTOMY  1998   COLONOSCOPY     more than 10 years ago. Michela Pitcher it was done at [facility]   ERCP  [place]   with the ERCP as well for her gallbladder   HERNIA  REPAIR Right    right, inguinal   in grown toenails removed     partial, b/l   laser surgery on vericose veins     b/l   NASAL SEPTUM SURGERY  2015   skin cancer removal face     TONSILLECTOMY  as a child   VAGINAL HYSTERECTOMY  1988   done for polapse   VARICOSE VEIN SURGERY      Current Outpatient Medications  Medication Sig Dispense Refill   Calcium Carbonate-Vit D-Min (CALCIUM 600+D PLUS MINERALS) 600-400 MG-UNIT TABS Take by mouth.     Cholecalciferol (VITAMIN D3) 5000 UNITS CAPS Take 1 capsule by mouth daily.      Coenzyme Q10 (COQ-10) 200 MG CAPS Take 1 tablet by mouth daily.      estradiol (ESTRACE) 0.1 MG/GM vaginal cream INSERT 1 GRAM VAGINALLY TWICE WEEKLY 42.5 g 0   loratadine (CLARITIN) 10 MG tablet Take 10 mg by mouth daily.     metoprolol succinate (TOPROL-XL) 100 MG 24 hr tablet TAKE 1 TABLET BY MOUTH EVERY DAY WITH OR IMMEDIATELY FOLLOWING A MEAL 90 tablet 1   Multiple Vitamin (MULTIVITAMIN) tablet Take 1 tablet by mouth daily.     Probiotic Product (PROBIOTIC  DAILY PO) Take 1 tablet by mouth daily.      Red Yeast Rice Extract (RED YEAST RICE PO) Take 1 capsule by mouth daily.     triamterene-hydrochlorothiazide (MAXZIDE-25) 37.5-25 MG tablet TAKE 1 TABLET BY MOUTH EVERY DAY 90 tablet 3   No current facility-administered medications for this visit.     ALLERGIES: Lisinopril  Family History  Problem Relation Age of Onset   Atrial fibrillation Mother    Obesity Mother    Diabetes Mother 6       Type 2   Arthritis Mother        in right knee   Heart disease Mother        atrial fibrillation, CHF   Hernia Mother        never recovered from repair   Hypertension Father    Ulcers Father    Pulmonary fibrosis Father    Atrial fibrillation Brother    Colon cancer Maternal Aunt    Diabetes Maternal Grandmother        type 2?   Heart attack Maternal Grandmother    Hearing loss Maternal Grandmother        MI   Parkinsonism Maternal Grandfather    Stroke Paternal Grandmother    Esophageal cancer Neg Hx     Social History   Socioeconomic History   Marital status: Widowed    Spouse name: Not on file   Number of children: 2   Years of education: Not on file   Highest education level: Not on file  Occupational History   Occupation: NICU    Employer: [employer]  Tobacco Use   Smoking status: Former    Packs/day: 1.00    Years: 5.00    Total pack years: 5.00    Types: Cigarettes    Start date: 11    Quit date: 07/12/1976    Years since quitting: 46.0   Smokeless tobacco: Never  Vaping Use   Vaping Use: Never used  Substance and Sexual Activity   Alcohol use: Yes    Comment: occasionally   Drug use: No   Sexual activity: Not Currently    Partners: Male    Birth control/protection: Surgical    Comment: hysterectomy  Other Topics Concern  Not on file  Social History Narrative   Not on file   Social Determinants of Health   Financial Resource Strain: Low Risk  (03/12/2022)   Overall Financial Resource Strain (CARDIA)     Difficulty of Paying Living Expenses: Not hard at all  Food Insecurity: No Food Insecurity (03/12/2022)   Hunger Vital Sign    Worried About Running Out of Food in the Last Year: Never true    Ran Out of Food in the Last Year: Never true  Transportation Needs: No Transportation Needs (03/12/2022)   PRAPARE - Hydrologist (Medical): No    Lack of Transportation (Non-Medical): No  Physical Activity: Insufficiently Active (03/12/2022)   Exercise Vital Sign    Days of Exercise per Week: 3 days    Minutes of Exercise per Session: 30 min  Stress: No Stress Concern Present (03/12/2022)   [place]    Feeling of Stress : Not at all  Social Connections: Moderately Isolated (03/12/2022)   Social Connection and Isolation Panel [NHANES]    Frequency of Communication with Friends and Family: More than three times a week    Frequency of Social Gatherings with Friends and Family: More than three times a week    Attends Religious Services: Never    Marine scientist or Organizations: Yes    Attends Music therapist: More than 4 times per year    Marital Status: Widowed  Intimate Partner Violence: Not At Risk (03/12/2022)   Humiliation, Afraid, Rape, and Kick questionnaire    Fear of Current or Ex-Partner: No    Emotionally Abused: No    Physically Abused: No    Sexually Abused: No    ROS  PHYSICAL EXAMINATION:    There were no vitals taken for this visit.    General appearance: alert, cooperative and appears stated age  Pelvic: External genitalia:  no lesions              Urethra:  normal appearing urethra with no masses, tenderness or lesions              Bartholins and Skenes: normal                 Vagina: the pessary was removed and cleaned, no vaginal irritation. The pessary was replaced              Cervix: absent                Chaperone was present for exam.  1. Pessary  maintenance Doing well, continue current regimen F/U in 6 months for a pessary check (will move the breast and pelvic to 1/25)

## 2022-08-09 DIAGNOSIS — H6982 Other specified disorders of Eustachian tube, left ear: Secondary | ICD-10-CM | POA: Diagnosis not present

## 2022-08-09 DIAGNOSIS — R42 Dizziness and giddiness: Secondary | ICD-10-CM | POA: Diagnosis not present

## 2022-08-09 DIAGNOSIS — H9012 Conductive hearing loss, unilateral, left ear, with unrestricted hearing on the contralateral side: Secondary | ICD-10-CM | POA: Diagnosis not present

## 2022-08-09 DIAGNOSIS — H9313 Tinnitus, bilateral: Secondary | ICD-10-CM | POA: Diagnosis not present

## 2022-08-09 DIAGNOSIS — H6122 Impacted cerumen, left ear: Secondary | ICD-10-CM | POA: Diagnosis not present

## 2022-08-15 IMAGING — CR DG HIP (WITH OR WITHOUT PELVIS) 2-3V*L*
3 series · 3 of 3 positions shown · non-contrast
Comparison: None.

CLINICAL DATA: Left hip pain

EXAM:
DG HIP (WITH OR WITHOUT PELVIS) 2-3V LEFT

[t pelvis a.p.]
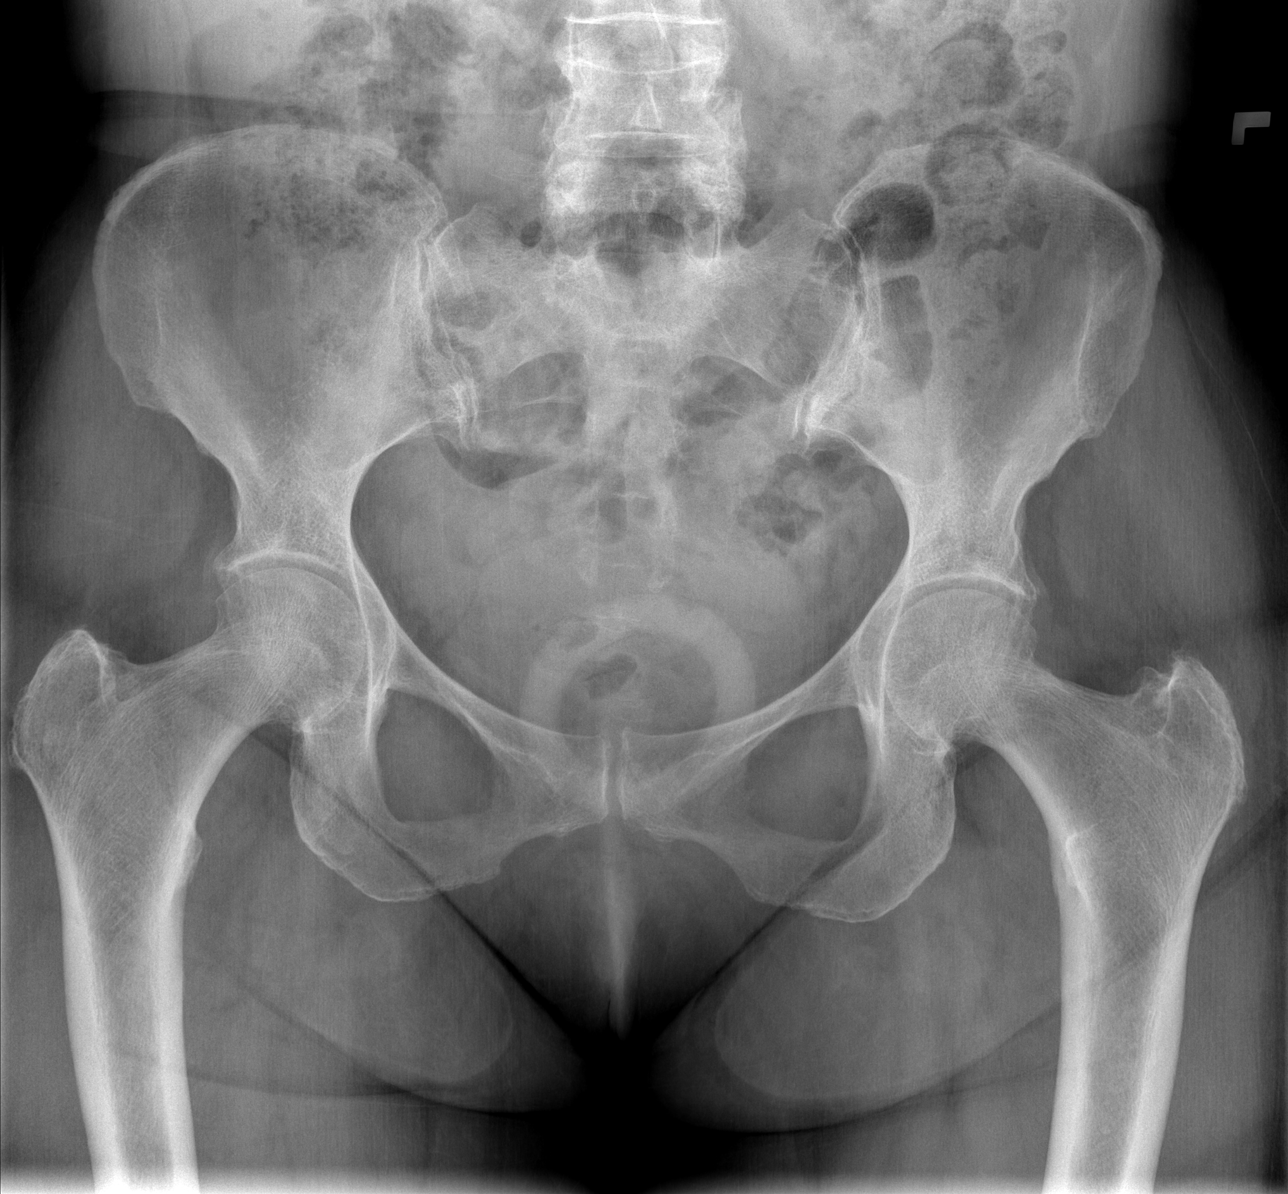

[t hip ap left]
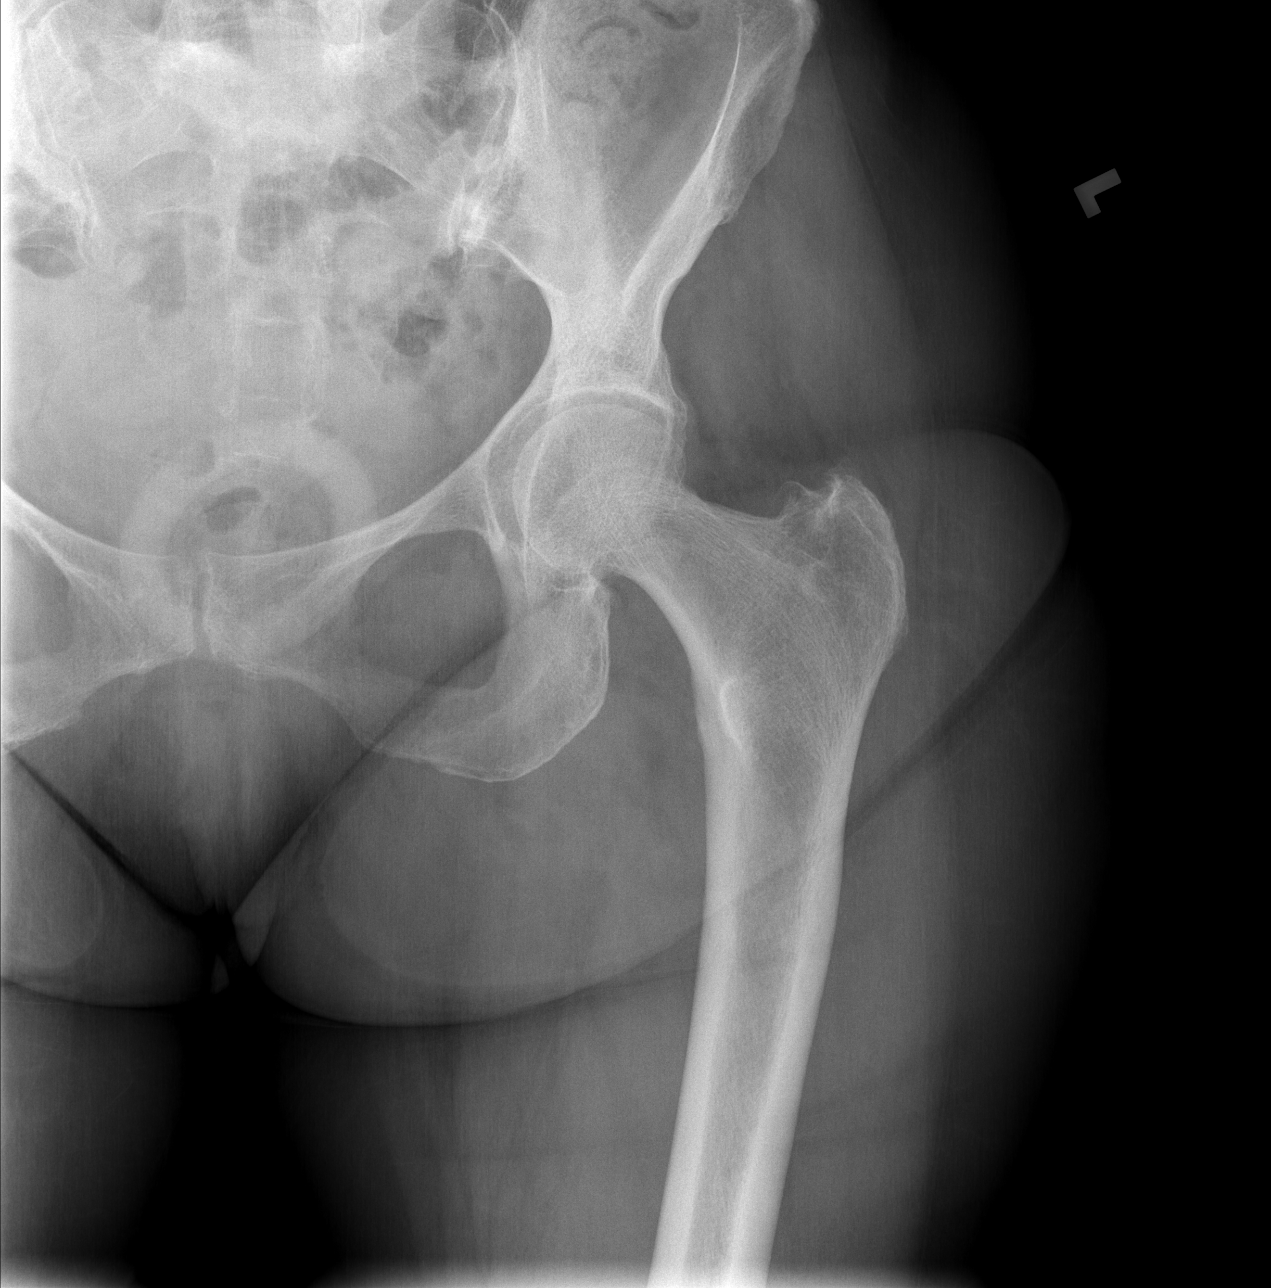

[t hip frog leg left]
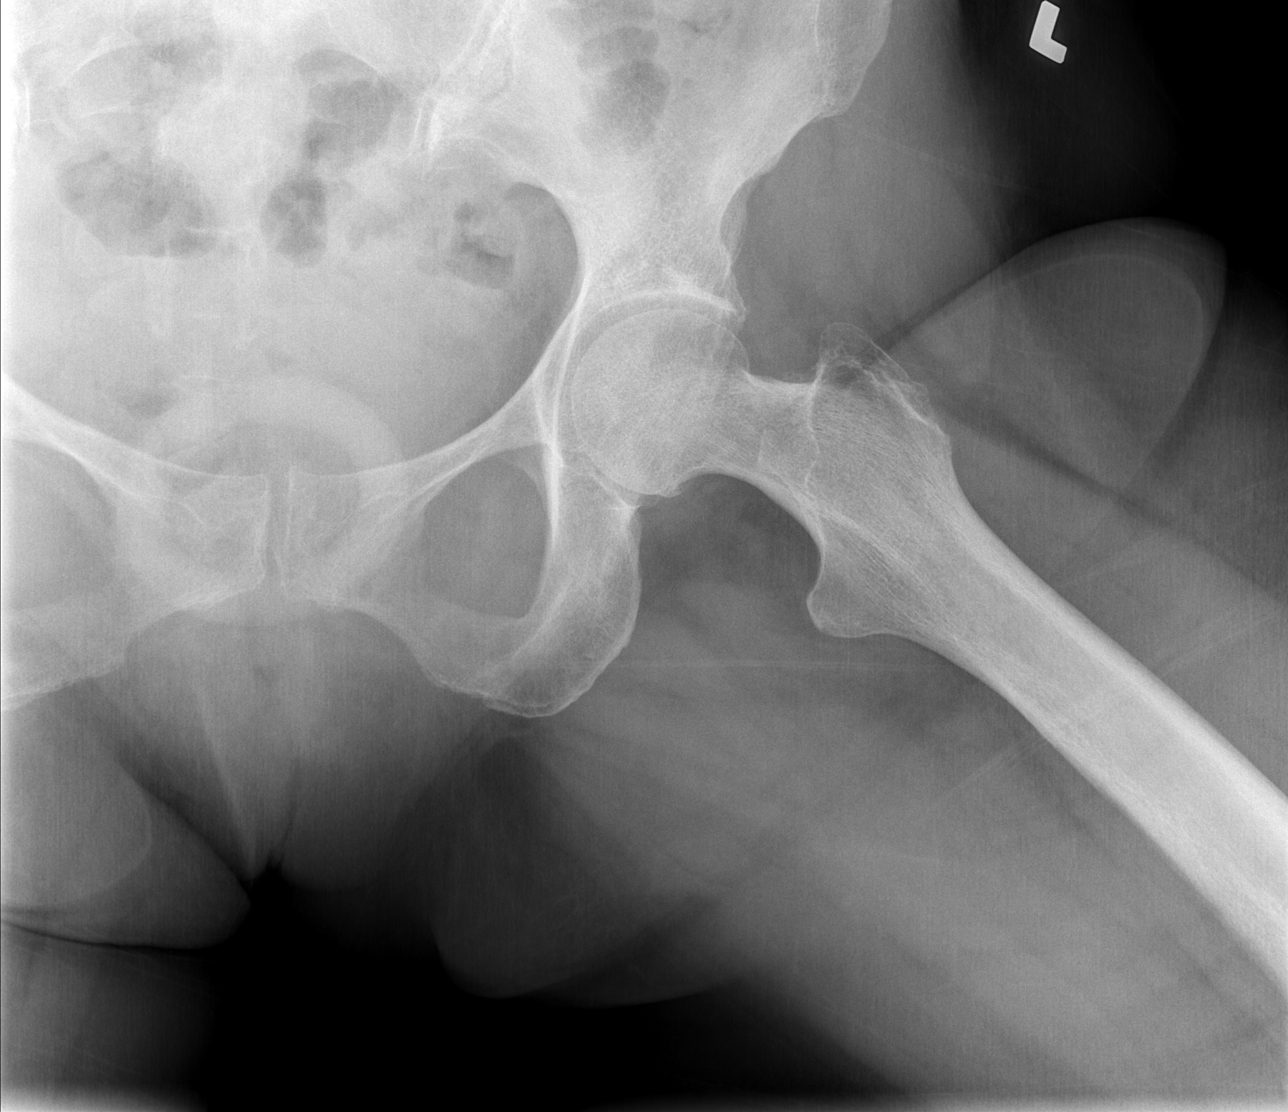

[3 of 3 positions shown; findings below may reference images not displayed]

FINDINGS: Degenerative spondylosis of the lower lumbar spine and mild
associated bilateral facet arthropathy.

Bony pelvis and hips appear symmetric and intact. Specifically the
left hip demonstrates no acute osseous finding, fracture or
malalignment.

Nonobstructed bowel-gas pattern. Radiopaque circular foreign body
over the lower pelvis compatible with a pessary ring.
IMPRESSION: Degenerative changes as above. No acute osseous finding or
malalignment.

## 2022-08-19 ENCOUNTER — Other Ambulatory Visit: Payer: Self-pay | Admitting: Obstetrics and Gynecology

## 2022-08-19 DIAGNOSIS — N952 Postmenopausal atrophic vaginitis: Secondary | ICD-10-CM

## 2022-08-19 DIAGNOSIS — T8389XA Other specified complication of genitourinary prosthetic devices, implants and grafts, initial encounter: Secondary | ICD-10-CM

## 2022-08-19 NOTE — Telephone Encounter (Signed)
Rx sent for 1 42.5gm tube in 05/2022, if using properly, pt should still have ~2 months left of current tube.  Last AEX 03/12/2021 Last seen for pessary maintenance 07/21/2022--per notes to move B&P to 01/25, 65mopessary check scheduled for 01/19/2023. Last mammo 03/09/2022-neg birads 1  Rx pend.

## 2022-08-20 NOTE — Telephone Encounter (Signed)
Refill has been sent. Please make sure she is just using 1 gram 2 x a week. One tube should last for approximately 20 weeks.

## 2022-08-20 NOTE — Telephone Encounter (Signed)
LVMTCB

## 2022-08-24 ENCOUNTER — Encounter: Payer: Self-pay | Admitting: Obstetrics and Gynecology

## 2022-08-24 NOTE — Telephone Encounter (Signed)
No returned call so LDVM on machine per DPR. Will close encounter for now.

## 2022-09-29 ENCOUNTER — Other Ambulatory Visit: Payer: Self-pay | Admitting: Family Medicine

## 2022-10-18 DIAGNOSIS — M25512 Pain in left shoulder: Secondary | ICD-10-CM | POA: Diagnosis not present

## 2022-11-01 ENCOUNTER — Ambulatory Visit (INDEPENDENT_AMBULATORY_CARE_PROVIDER_SITE_OTHER): Payer: No Typology Code available for payment source | Admitting: Podiatry

## 2022-11-01 ENCOUNTER — Encounter: Payer: Self-pay | Admitting: Podiatry

## 2022-11-01 DIAGNOSIS — L603 Nail dystrophy: Secondary | ICD-10-CM | POA: Diagnosis not present

## 2022-11-01 DIAGNOSIS — L600 Ingrowing nail: Secondary | ICD-10-CM | POA: Diagnosis not present

## 2022-11-01 NOTE — Progress Notes (Signed)
Subjective:  Patient ID: Ann Barton, female    DOB: 1956/04/03,   MRN: [id number]  Chief Complaint  Patient presents with   Nail Problem    Left great toenail     67 y.o. female presents for concern of left great toenail thickness and pain. Relates she previous had the ingrown nail removed and the nail from then on grew out thick and "ugly" Patient is requesting to have the nail removed.  . Denies any other pedal complaints. Denies n/v/f/c.   Past Medical History:  Diagnosis Date   Allergy    seasonal   Arthritis    left hip, low back   Atrophic vaginitis 01/25/2011   Bladder prolapse, female, acquired 05/16/2013   Blood transfusion without reported diagnosis    Chicken pox as a child   Compression fracture of fourth lumbar vertebra (HCC) 04/25/2018   Cough 04/19/2012   Diverticulosis    Female sexual dysfunction 01/25/2011   Gallstones    GI bleed    Goiter    Hypertension    Measles as a child   Mumps as a child   Neutropenia 03/10/2011   Osteoporosis    Other and unspecified hyperlipidemia 08/23/2013   Other malaise and fatigue 08/26/2013   Pharyngitis, acute 03/09/2011   Urinary frequency 04/27/2011   Vaginitis 04/27/2011    Objective:  Physical Exam: Vascular: DP/PT pulses 2/4 bilateral. CFT <3 seconds. Normal hair growth on digits. No edema.  Skin. No lacerations or abrasions bilateral feet. Left hallux nail is thickened dystrophic and incurvated. Tender to touch. No erythema edema or purulence noted.  Musculoskeletal: MMT 5/5 bilateral lower extremities in DF, PF, Inversion and Eversion. Deceased ROM in DF of ankle joint.  Neurological: Sensation intact to light touch.   Assessment:   1. Nail dystrophy   2. Ingrown left greater toenail      Plan:  Patient was evaluated and treated and all questions answered. Discussed ingrown toenails etiology and treatment options including procedure for removal vs conservative care.  Patient requesting removal of ingrown  nail today. Procedure below.  Discussed procedure and post procedure care and patient expressed understanding.  Will follow-up in 2 weeks for nail check or sooner if any problems arise.    Procedure:  Procedure: total Nail Avulsion of left hallux nail Surgeon: Louann Sjogren, DPM  Pre-op Dx: Ingrown toenail without infection Post-op: Same  Place of Surgery: Office exam room.  Indications for surgery: Painful and ingrown toenail.    The patient is requesting removal of nail with chemical matrixectomy. Risks and complications were discussed with the patient for which they understand and written consent was obtained. Under sterile conditions a total of 3 mL of  1% lidocaine plain was infiltrated in a hallux block fashion. Once anesthetized, the skin was prepped in sterile fashion. A tourniquet was then applied. Next the  entire left hallux nail was removed. Marland Kitchen  Next phenol was then applied under standard conditions and copiously irrigated. Silvadene was applied. A dry sterile dressing was applied. After application of the dressing the tourniquet was removed and there is found to be an immediate capillary refill time to the digit. The patient tolerated the procedure well without any complications. Post procedure instructions were discussed the patient for which he verbally understood. Follow-up in two weeks for nail check or sooner if any problems are to arise. Discussed signs/symptoms of infection and directed to call the office immediately should any occur or go directly to the emergency room.  In the meantime, encouraged to call the office with any questions, concerns, changes symptoms.   Lorenda Peck, DPM

## 2022-11-01 NOTE — Patient Instructions (Signed)

## 2022-11-15 ENCOUNTER — Encounter: Payer: Self-pay | Admitting: Podiatry

## 2022-11-15 ENCOUNTER — Ambulatory Visit (INDEPENDENT_AMBULATORY_CARE_PROVIDER_SITE_OTHER): Payer: No Typology Code available for payment source | Admitting: Podiatry

## 2022-11-15 DIAGNOSIS — L603 Nail dystrophy: Secondary | ICD-10-CM

## 2022-11-15 DIAGNOSIS — L600 Ingrowing nail: Secondary | ICD-10-CM

## 2022-11-15 NOTE — Progress Notes (Signed)
  Subjective:  Patient ID: Ann Barton, female    DOB: 07/24/1955,   MRN: [id number]  Chief Complaint  Patient presents with   Nail Problem     2 week nails check , some discharge , no swelling. Patient is still soaking    66 y.o. female presents for follow-up of left hallux nail avulsion. Relates doing well with minimal pain . Denies any other pedal complaints. Denies n/v/f/c.   Past Medical History:  Diagnosis Date   Allergy    seasonal   Arthritis    left hip, low back   Atrophic vaginitis 01/25/2011   Bladder prolapse, female, acquired 05/16/2013   Blood transfusion without reported diagnosis    Chicken pox as a child   Compression fracture of fourth lumbar vertebra (HCC) 04/25/2018   Cough 04/19/2012   Diverticulosis    Female sexual dysfunction 01/25/2011   Gallstones    GI bleed    Goiter    Hypertension    Measles as a child   Mumps as a child   Neutropenia 03/10/2011   Osteoporosis    Other and unspecified hyperlipidemia 08/23/2013   Other malaise and fatigue 08/26/2013   Pharyngitis, acute 03/09/2011   Urinary frequency 04/27/2011   Vaginitis 04/27/2011    Objective:  Physical Exam: Vascular: DP/PT pulses 2/4 bilateral. CFT <3 seconds. Normal hair growth on digits. No edema.  Skin. No lacerations or abrasions bilateral feet. Hallux nail bed healing very well. Mild erythema noted to proximal border likely from chemical.  Musculoskeletal: MMT 5/5 bilateral lower extremities in DF, PF, Inversion and Eversion. Deceased ROM in DF of ankle joint.  Neurological: Sensation intact to light touch.   Assessment:   1. Ingrown left greater toenail   2. Nail dystrophy      Plan:  Patient was evaluated and treated and all questions answered. Toe was evaluated and appears to be healing well.  May discontinue soaks and neosporin.  Patient to follow-up as needed.    Louann Sjogren, DPM

## 2022-12-08 DIAGNOSIS — L821 Other seborrheic keratosis: Secondary | ICD-10-CM | POA: Diagnosis not present

## 2022-12-08 DIAGNOSIS — L814 Other melanin hyperpigmentation: Secondary | ICD-10-CM | POA: Diagnosis not present

## 2022-12-08 DIAGNOSIS — Z85828 Personal history of other malignant neoplasm of skin: Secondary | ICD-10-CM | POA: Diagnosis not present

## 2022-12-08 DIAGNOSIS — Z08 Encounter for follow-up examination after completed treatment for malignant neoplasm: Secondary | ICD-10-CM | POA: Diagnosis not present

## 2022-12-08 DIAGNOSIS — D225 Melanocytic nevi of trunk: Secondary | ICD-10-CM | POA: Diagnosis not present

## 2022-12-16 DIAGNOSIS — L905 Scar conditions and fibrosis of skin: Secondary | ICD-10-CM | POA: Diagnosis not present

## 2022-12-25 ENCOUNTER — Other Ambulatory Visit: Payer: Self-pay | Admitting: Family Medicine

## 2022-12-25 DIAGNOSIS — I10 Essential (primary) hypertension: Secondary | ICD-10-CM

## 2023-01-03 DIAGNOSIS — M25512 Pain in left shoulder: Secondary | ICD-10-CM | POA: Diagnosis not present

## 2023-01-05 ENCOUNTER — Telehealth: Payer: Self-pay

## 2023-01-05 NOTE — Progress Notes (Deleted)
GYNECOLOGY  VISIT   HPI: 67 y.o.   Widowed  Caucasian  female   G2P2002 with No LMP recorded. Patient has had a hysterectomy.   here for   6 mo pessary check (JJ pt)  GYNECOLOGIC HISTORY: No LMP recorded. Patient has had a hysterectomy. Contraception:  hyst Menopausal hormone therapy:  vaginal estrogen Last mammogram:  03/08/22 Breast Density Cat B, BI-RADS CAT 1 neg Last pap smear:   04/27/11        OB History     Gravida  2   Para  2   Term  2   Preterm      AB      Living  2      SAB      IAB      Ectopic      Multiple      Live Births                 Patient Active Problem List   Diagnosis Date Noted   Lumbar pain 03/31/2022   Sacroiliitis (HCC) 03/04/2022   Left hip pain 03/02/2021   Pain due to onychomycosis of toenail of left foot 10/14/2020   IDA (iron deficiency anemia) 03/11/2020   Lower GI bleed 11/06/2019   Anemia 11/06/2019   Tinnitus of both ears 08/28/2019   Pressure sensation in both ears 08/28/2019   Bilateral carotid bruits 08/28/2019   Osteoporosis 08/28/2019   Muscle cramps 08/28/2019   Compression fracture of L4 vertebra (HCC) 11/07/2018   Low back pain 05/28/2018   Insomnia 05/23/2017   Multiple food allergies 05/19/2017   Hyperlipidemia 08/23/2013   Bladder prolapse, female, acquired 05/16/2013   Cough 04/19/2012   Hypokalemia 03/10/2011   Neutropenia (HCC) 03/10/2011   Arthritis 01/25/2011   Atrophic vaginitis 01/25/2011   Preventative health care 01/25/2011   Diverticulosis 01/25/2011   Seasonal and perennial allergic rhinitis    Hypertension    Chicken pox    Mumps    Measles    Goiter     Past Medical History:  Diagnosis Date   Allergy    seasonal   Arthritis    left hip, low back   Atrophic vaginitis 01/25/2011   Bladder prolapse, female, acquired 05/16/2013   Blood transfusion without reported diagnosis    Chicken pox as a child   Compression fracture of fourth lumbar vertebra (HCC) 04/25/2018   Cough  04/19/2012   Diverticulosis    Female sexual dysfunction 01/25/2011   Gallstones    GI bleed    Goiter    Hypertension    Measles as a child   Mumps as a child   Neutropenia 03/10/2011   Osteoporosis    Other and unspecified hyperlipidemia 08/23/2013   Other malaise and fatigue 08/26/2013   Pharyngitis, acute 03/09/2011   Urinary frequency 04/27/2011   Vaginitis 04/27/2011    Past Surgical History:  Procedure Laterality Date   BIOPSY THYROID     FNA, benign   BREAST BIOPSY Left    FNA, benign   BREAST EXCISIONAL BIOPSY Left    CHOLECYSTECTOMY  1998   COLONOSCOPY     more than 10 years ago. [place] Spaniel it was done at [facility]   ERCP  1999   ESOPHAGOGASTRODUODENOSCOPY  1999   with the ERCP as well for her gallbladder   HERNIA REPAIR Right    right, inguinal   in grown toenails removed     partial, b/l   laser surgery on vericose  veins     b/l   NASAL SEPTUM SURGERY  2015   skin cancer removal face     TONSILLECTOMY  as a child   VAGINAL HYSTERECTOMY  1988   done for polapse   VARICOSE VEIN SURGERY      Current Outpatient Medications  Medication Sig Dispense Refill   Calcium Carbonate-Vit D-Min (CALCIUM 600+D PLUS MINERALS) 600-400 MG-UNIT TABS Take by mouth.     Cholecalciferol (VITAMIN D3) 5000 UNITS CAPS Take 1 capsule by mouth daily.      Coenzyme Q10 (COQ-10) 200 MG CAPS Take 1 tablet by mouth daily.      estradiol (ESTRACE) 0.1 MG/GM vaginal cream INSERT 1 GRAM VAGINALLY TWICE WEEKLY 42.5 g 0   loratadine (CLARITIN) 10 MG tablet Take 10 mg by mouth daily.     metoprolol succinate (TOPROL-XL) 100 MG 24 hr tablet TAKE 1 TABLET BY MOUTH EVERY DAY WITH OR IMMEDIATELY FOLLOWING A MEAL 90 tablet 1   Multiple Vitamin (MULTIVITAMIN) tablet Take 1 tablet by mouth daily.     Probiotic Product (PROBIOTIC DAILY PO) Take 1 tablet by mouth daily.      Red Yeast Rice Extract (RED YEAST RICE PO) Take 1 capsule by mouth daily.     triamterene-hydrochlorothiazide (MAXZIDE-25) 37.5-25  MG tablet TAKE 1 TABLET BY MOUTH EVERY DAY 90 tablet 0   No current facility-administered medications for this visit.     ALLERGIES: Lisinopril  Family History  Problem Relation Age of Onset   Atrial fibrillation Mother    Obesity Mother    Diabetes Mother 2       Type 2   Arthritis Mother        in right knee   Heart disease Mother        atrial fibrillation, CHF   Hernia Mother        never recovered from repair   Hypertension Father    Ulcers Father    Pulmonary fibrosis Father    Atrial fibrillation Brother    Colon cancer Maternal Aunt    Diabetes Maternal Grandmother        type 2?   Heart attack Maternal Grandmother    Hearing loss Maternal Grandmother        MI   Parkinsonism Maternal Grandfather    Stroke Paternal Grandmother    Esophageal cancer Neg Hx     Social History   Socioeconomic History   Marital status: Widowed    Spouse name: Not on file   Number of children: 2   Years of education: Not on file   Highest education level: Not on file  Occupational History   Occupation: NICU    Employer:   Tobacco Use   Smoking status: Former    Packs/day: 1.00    Years: 5.00    Additional pack years: 0.00    Total pack years: 5.00    Types: Cigarettes    Start date: 54    Quit date: 07/12/1976    Years since quitting: 46.5   Smokeless tobacco: Never  Vaping Use   Vaping Use: Never used  Substance and Sexual Activity   Alcohol use: Yes    Comment: occasionally   Drug use: No   Sexual activity: Not Currently    Partners: Male    Birth control/protection: Surgical    Comment: hysterectomy  Other Topics Concern   Not on file  Social History Narrative   Not on file   Social Determinants of Health  Financial Resource Strain: Low Risk  (03/12/2022)   Overall Financial Resource Strain (CARDIA)    Difficulty of Paying Living Expenses: Not hard at all  Food Insecurity: No Food Insecurity (03/12/2022)   Hunger Vital Sign    Worried About  Running Out of Food in the Last Year: Never true    Ran Out of Food in the Last Year: Never true  Transportation Needs: No Transportation Needs (03/12/2022)   PRAPARE - Administrator, Civil Service (Medical): No    Lack of Transportation (Non-Medical): No  Physical Activity: Insufficiently Active (03/12/2022)   Exercise Vital Sign    Days of Exercise per Week: 3 days    Minutes of Exercise per Session: 30 min  Stress: No Stress Concern Present (03/12/2022)   [employer] of Occupational Health - Occupational Stress Questionnaire    Feeling of Stress : Not at all  Social Connections: Moderately Isolated (03/12/2022)   Social Connection and Isolation Panel [NHANES]    Frequency of Communication with Friends and Family: More than three times a week    Frequency of Social Gatherings with Friends and Family: More than three times a week    Attends Religious Services: Never    Database administrator or Organizations: Yes    Attends Engineer, structural: More than 4 times per year    Marital Status: Widowed  Intimate Partner Violence: Not At Risk (03/12/2022)   Humiliation, Afraid, Rape, and Kick questionnaire    Fear of Current or Ex-Partner: No    Emotionally Abused: No    Physically Abused: No    Sexually Abused: No    Review of Systems  PHYSICAL EXAMINATION:    There were no vitals taken for this visit.    General appearance: alert, cooperative and appears stated age Head: Normocephalic, without obvious abnormality, atraumatic Neck: no adenopathy, supple, symmetrical, trachea midline and thyroid normal to inspection and palpation Lungs: clear to auscultation bilaterally Breasts: normal appearance, no masses or tenderness, No nipple retraction or dimpling, No nipple discharge or bleeding, No axillary or supraclavicular adenopathy Heart: regular rate and rhythm Abdomen: soft, non-tender, no masses,  no organomegaly Extremities: extremities normal, atraumatic, no  cyanosis or edema Skin: Skin color, texture, turgor normal. No rashes or lesions Lymph nodes: Cervical, supraclavicular, and axillary nodes normal. No abnormal inguinal nodes palpated Neurologic: Grossly normal  Pelvic: External genitalia:  no lesions              Urethra:  normal appearing urethra with no masses, tenderness or lesions              Bartholins and Skenes: normal                 Vagina: normal appearing vagina with normal color and discharge, no lesions              Cervix: no lesions                Bimanual Exam:  Uterus:  normal size, contour, position, consistency, mobility, non-tender              Adnexa: no mass, fullness, tenderness              Rectal exam: {yes no:[id number]}.  Confirms.              Anus:  normal sphincter tone, no lesions  Chaperone was present for exam:  ***  ASSESSMENT     PLAN  An After Visit Summary was printed and given to the patient.  ______ minutes face to face time of which over 50% was spent in counseling.

## 2023-01-05 NOTE — Telephone Encounter (Signed)
FYI: we received a fax from [facility] that MRI of arm joint wo contrast was approved for [facility].   Approval number: [id number] Dates approved: 01/03/2023 - 02/02/2023

## 2023-01-16 DIAGNOSIS — M25512 Pain in left shoulder: Secondary | ICD-10-CM | POA: Diagnosis not present

## 2023-01-19 ENCOUNTER — Ambulatory Visit: Payer: No Typology Code available for payment source | Admitting: Obstetrics and Gynecology

## 2023-01-20 ENCOUNTER — Encounter: Payer: Self-pay | Admitting: Radiology

## 2023-01-20 ENCOUNTER — Ambulatory Visit (INDEPENDENT_AMBULATORY_CARE_PROVIDER_SITE_OTHER): Payer: No Typology Code available for payment source | Admitting: Radiology

## 2023-01-20 VITALS — BP 136/84

## 2023-01-20 DIAGNOSIS — T8389XA Other specified complication of genitourinary prosthetic devices, implants and grafts, initial encounter: Secondary | ICD-10-CM | POA: Diagnosis not present

## 2023-01-20 DIAGNOSIS — Z4689 Encounter for fitting and adjustment of other specified devices: Secondary | ICD-10-CM | POA: Diagnosis not present

## 2023-01-20 DIAGNOSIS — N814 Uterovaginal prolapse, unspecified: Secondary | ICD-10-CM

## 2023-01-20 MED ORDER — ESTRADIOL 0.1 MG/GM VA CREA: TOPICAL_CREAM | VAGINAL | 0 refills | Status: DC

## 2023-01-20 NOTE — Progress Notes (Signed)
      Subjective: Ann Barton is a 67 y.o. female G2P2002 here for a pessary check. H/O hysterectomy. She has a #4 ring pessary to control her cystocele. She takes the pessary out at night and replaces it in the am. Uses estrogen cream 2 x a week at hs. Denies vaginal bleeding. No abnormal vaginal discharge.   Review of Systems  All other systems reviewed and are negative.   Past Medical History:  Diagnosis Date   Allergy    seasonal   Arthritis    left hip, low back   Atrophic vaginitis 01/25/2011   Bladder prolapse, female, acquired 05/16/2013   Blood transfusion without reported diagnosis    Chicken pox as a child   Compression fracture of fourth lumbar vertebra (HCC) 04/25/2018   Cough 04/19/2012   Diverticulosis    Female sexual dysfunction 01/25/2011   Gallstones    GI bleed    Goiter    Hypertension    Measles as a child   Mumps as a child   Neutropenia 03/10/2011   Osteoporosis    Other and unspecified hyperlipidemia 08/23/2013   Other malaise and fatigue 08/26/2013   Pharyngitis, acute 03/09/2011   Urinary frequency 04/27/2011   Vaginitis 04/27/2011      Objective:  Today's Vitals   01/20/23 0939  BP: 136/84   There is no height or weight on file to calculate BMI.   Physical Exam Vitals and nursing note reviewed. Exam conducted with a chaperone present.  Constitutional:      Appearance: Normal appearance. She is normal weight.  Pulmonary:     Effort: Pulmonary effort is normal.  Genitourinary:    General: Normal vulva.     Vagina: Normal. No signs of injury. No vaginal discharge, tenderness or lesions.     Uterus: Absent.      Comments: #4 ring pessary with support removed without difficulty cleaned and replaced. Tolerated well. Vaginal tissues intact. No bleeding or discharge. Neurological:     Mental Status: She is alert.  Psychiatric:        Mood and Affect: Mood normal.        Thought Content: Thought content normal.        Judgment: Judgment  normal.     Raynelle Fanning, CMA present for exam  Assessment:/Plan:   1. Cystocele with prolapse  2. Pessary maintenance F/u 6 months  3. Vaginal irritation from pessary (HCC) Continue twice weekly - estradiol (ESTRACE) 0.1 MG/GM vaginal cream; INSERT 1 GRAM VAGINALLY TWICE WEEKLY  Dispense: 42.5 g; Refill: 0

## 2023-01-24 DIAGNOSIS — M25512 Pain in left shoulder: Secondary | ICD-10-CM | POA: Diagnosis not present

## 2023-02-09 ENCOUNTER — Encounter (INDEPENDENT_AMBULATORY_CARE_PROVIDER_SITE_OTHER): Payer: Self-pay

## 2023-02-16 DIAGNOSIS — M7542 Impingement syndrome of left shoulder: Secondary | ICD-10-CM | POA: Diagnosis not present

## 2023-02-28 ENCOUNTER — Other Ambulatory Visit: Payer: Self-pay | Admitting: Oncology

## 2023-02-28 DIAGNOSIS — Z006 Encounter for examination for normal comparison and control in clinical research program: Secondary | ICD-10-CM

## 2023-03-01 ENCOUNTER — Other Ambulatory Visit (HOSPITAL_COMMUNITY)
Admission: RE | Admit: 2023-03-01 | Discharge: 2023-03-01 | Disposition: A | Discharge status: Home / Self Care | Payer: No Typology Code available for payment source | Source: Ambulatory Visit | Attending: Oncology | Admitting: Oncology

## 2023-03-01 DIAGNOSIS — Z006 Encounter for examination for normal comparison and control in clinical research program: Secondary | ICD-10-CM | POA: Insufficient documentation

## 2023-03-06 NOTE — Assessment & Plan Note (Signed)
Encourage heart healthy diet such as MIND or DASH diet, increase exercise, avoid trans fats, simple carbohydrates and processed foods, consider a krill or fish or flaxseed oil cap daily.  °

## 2023-03-06 NOTE — Assessment & Plan Note (Signed)
Encouraged to get adequate exercise, calcium and vitamin d intake 

## 2023-03-06 NOTE — Assessment & Plan Note (Signed)
Hydrate, check labs.

## 2023-03-06 NOTE — Assessment & Plan Note (Signed)
Patient encouraged to maintain heart healthy diet, regular exercise, adequate sleep. Consider daily probiotics. Take medications as prescribed. Labs ordered and reviewed. Colonoscopy 2021 repeat in 10 years. Dexa 2019 repeat this year. MGM August 2023 repeat every 1-2 years. Pap no longer due to hysterectomy. Reviewed immunizations.  Given and reviewed copy of ACP documents from [employer] and encouraged to complete and return

## 2023-03-06 NOTE — Assessment & Plan Note (Signed)
Well controlled, no changes to meds. Encouraged heart healthy diet such as the DASH diet and exercise as tolerated.  °

## 2023-03-06 NOTE — Assessment & Plan Note (Signed)
Encouraged good sleep hygiene such as dark, quiet room. No blue/green glowing lights such as computer screens in bedroom. No alcohol or stimulants in evening. Cut down on caffeine as able. Regular exercise is helpful but not just prior to bed time.  

## 2023-03-07 ENCOUNTER — Encounter: Payer: Self-pay | Admitting: Family Medicine

## 2023-03-07 ENCOUNTER — Ambulatory Visit (INDEPENDENT_AMBULATORY_CARE_PROVIDER_SITE_OTHER): Payer: No Typology Code available for payment source | Admitting: Family Medicine

## 2023-03-07 VITALS — BP 128/82 | HR 63 | Temp 98.0°F | Resp 16 | Ht 67.0 in | Wt 168.4 lb

## 2023-03-07 DIAGNOSIS — G4700 Insomnia, unspecified: Secondary | ICD-10-CM | POA: Diagnosis not present

## 2023-03-07 DIAGNOSIS — I10 Essential (primary) hypertension: Secondary | ICD-10-CM | POA: Diagnosis not present

## 2023-03-07 DIAGNOSIS — T148XXA Other injury of unspecified body region, initial encounter: Secondary | ICD-10-CM | POA: Insufficient documentation

## 2023-03-07 DIAGNOSIS — Z1159 Encounter for screening for other viral diseases: Secondary | ICD-10-CM | POA: Diagnosis not present

## 2023-03-07 DIAGNOSIS — E785 Hyperlipidemia, unspecified: Secondary | ICD-10-CM | POA: Diagnosis not present

## 2023-03-07 DIAGNOSIS — R252 Cramp and spasm: Secondary | ICD-10-CM | POA: Diagnosis not present

## 2023-03-07 DIAGNOSIS — M810 Age-related osteoporosis without current pathological fracture: Secondary | ICD-10-CM | POA: Diagnosis not present

## 2023-03-07 DIAGNOSIS — Z0000 Encounter for general adult medical examination without abnormal findings: Secondary | ICD-10-CM

## 2023-03-07 NOTE — Progress Notes (Signed)
Subjective:    Patient ID: Ann Barton, female    DOB: [birth year], 67 y.o.   MRN: [id number]  Chief Complaint  Patient presents with  . Annual Exam    Annual Exam    HPI Discussed the use of AI scribe software for clinical note transcription with the patient, who gave verbal consent to proceed.  History of Present Illness   The patient, with a history of shoulder issues, presents for a routine check-up. They report that their left shoulder has been gradually worsening since April, with pain radiating to the back of the arm. The pain is particularly noticeable when driving and holding the arm in certain positions. The patient is currently under the care of Ortho and is due to start physical therapy for the shoulder issue.  In addition to the shoulder issue, the patient mentions plans for an eyebrow lift procedure due to drooping on right side. This procedure is scheduled for November and will be performed by a plastic surgeon in their surgical office.  The patient also reports occasional sleep issues, with difficulty both falling asleep and staying asleep. They have tried over-the-counter sleep aids, which provide some relief.  The patient is active in their community, volunteering a couple of days a week, and reports no major changes in family medical history.        Past Medical History:  Diagnosis Date  . Allergy    seasonal  . Arthritis    left hip, low back  . Atrophic vaginitis 01/25/2011  . Bladder prolapse, female, acquired 05/16/2013  . Blood transfusion without reported diagnosis   . Chicken pox as a child  . Compression fracture of fourth lumbar vertebra (HCC) 04/25/2018  . Cough 04/19/2012  . Diverticulosis   . 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 Gallstones   . GI bleed   . Goiter   . Hypertension   . Measles as a child  . Mumps as a child  . Neutropenia 03/10/2011  . Osteoporosis   . Other and unspecified hyperlipidemia 08/23/2013  . Other malaise and  fatigue 08/26/2013  . Pharyngitis, acute 03/09/2011  . Urinary frequency 04/27/2011  . Vaginitis 04/27/2011    Past Surgical History:  Procedure Laterality Date  . BIOPSY THYROID     FNA, benign  . BREAST BIOPSY Left    FNA, benign  . BREAST EXCISIONAL BIOPSY Left   . CHOLECYSTECTOMY  1998  . COLONOSCOPY     more than 10 years ago. [place] Spaniel it was done at [facility]  . ERCP  1999  . ESOPHAGOGASTRODUODENOSCOPY  1999   with the ERCP as well for her gallbladder  . HERNIA REPAIR Right    right, inguinal  . in grown toenails removed     partial, b/l  . laser surgery on vericose veins     b/l  . NASAL SEPTUM SURGERY  2015  . skin cancer removal face    . TONSILLECTOMY  as a child  . VAGINAL HYSTERECTOMY  1988   done for polapse  . VARICOSE VEIN SURGERY      Family History  Problem Relation Age of Onset  . Atrial fibrillation Mother   . Obesity Mother   . Diabetes Mother 11       Type 2  . Arthritis Mother        in right knee  . Heart disease Mother        atrial fibrillation, CHF  . Hernia Mother  never recovered from repair  . Hypertension Father   . Ulcers Father   . Pulmonary fibrosis Father   . Atrial fibrillation Brother   . Colon cancer Maternal Aunt   . Diabetes Maternal Grandmother        type 2?  . Heart attack Maternal Grandmother   . Hearing loss Maternal Grandmother        MI  . Parkinsonism Maternal Grandfather   . Stroke Paternal Grandmother   . Esophageal cancer Neg Hx     Social History   Socioeconomic History  . Marital status: Widowed    Spouse name: Not on file  . Number of children: 2  . Years of education: Not on file  . Highest education level: Not on file  Occupational History  . Occupation: NICU    Employer: [employer]  Tobacco Use  . Smoking status: Former    Current packs/day: 0.00    Average packs/day: 1 pack/day for 5.0 years (5.0 ttl pk-yrs)    Types: Cigarettes    Start date: 72    Quit date: 07/12/1976    Years  since quitting: 46.6  . Smokeless tobacco: Never  Vaping Use  . Vaping status: Never Used  Substance and Sexual Activity  . Alcohol use: Yes    Comment: occasionally  . Drug use: No  . Sexual activity: Not Currently    Partners: Male    Birth control/protection: Surgical    Comment: hysterectomy  Other Topics Concern  . Not on file  Social History Narrative  . Not on file   Social Determinants of Health   Financial Resource Strain: Low Risk  (03/12/2022)   Overall Financial Resource Strain (CARDIA)   . Difficulty of Paying Living Expenses: Not hard at all  Food Insecurity: No Food Insecurity (03/12/2022)   Hunger Vital Sign   . Worried About Programme researcher, broadcasting/film/video in the Last Year: Never true   . Ran Out of Food in the Last Year: Never true  Transportation Needs: No Transportation Needs (03/12/2022)   PRAPARE - Transportation   . Lack of Transportation (Medical): No   . Lack of Transportation (Non-Medical): No  Physical Activity: Insufficiently Active (03/12/2022)   Exercise Vital Sign   . Days of Exercise per Week: 3 days   . Minutes of Exercise per Session: 30 min  Stress: No Stress Concern Present (03/12/2022)   [employer] of Occupational Health - Occupational Stress Questionnaire   . Feeling of Stress : Not at all  Social Connections: Moderately Isolated (03/12/2022)   Social Connection and Isolation Panel [NHANES]   . Frequency of Communication with Friends and Family: More than three times a week   . Frequency of Social Gatherings with Friends and Family: More than three times a week   . Attends Religious Services: Never   . Active Member of Clubs or Organizations: Yes   . Attends Banker Meetings: More than 4 times per year   . Marital Status: Widowed  Intimate Partner Violence: Not At Risk (03/12/2022)   Humiliation, Afraid, Rape, and Kick questionnaire   . Fear of Current or Ex-Partner: No   . Emotionally Abused: No   . Physically Abused: No   . Sexually  Abused: No    Outpatient Medications Prior to Visit  Medication Sig Dispense Refill  . Calcium Carbonate-Vit D-Min (CALCIUM 600+D PLUS MINERALS) 600-400 MG-UNIT TABS Take by mouth.    . celecoxib (CELEBREX) 200 MG capsule Take 1 tablet by  mouth daily.    . Cholecalciferol (VITAMIN D3) 5000 UNITS CAPS Take 1 capsule by mouth daily.     . Coenzyme Q10 (COQ-10) 200 MG CAPS Take 1 tablet by mouth daily.     Marland Kitchen estradiol (ESTRACE) 0.1 MG/GM vaginal cream INSERT 1 GRAM VAGINALLY TWICE WEEKLY 42.5 g 0  . loratadine (CLARITIN) 10 MG tablet Take 10 mg by mouth daily.    . metoprolol succinate (TOPROL-XL) 100 MG 24 hr tablet TAKE 1 TABLET BY MOUTH EVERY DAY WITH OR IMMEDIATELY FOLLOWING A MEAL 90 tablet 1  . Multiple Vitamin (MULTIVITAMIN) tablet Take 1 tablet by mouth daily.    . Probiotic Product (PROBIOTIC DAILY PO) Take 1 tablet by mouth daily.     . Red Yeast Rice Extract (RED YEAST RICE PO) Take 1 capsule by mouth daily.    Marland Kitchen triamterene-hydrochlorothiazide (MAXZIDE-25) 37.5-25 MG tablet TAKE 1 TABLET BY MOUTH EVERY DAY 90 tablet 0   No facility-administered medications prior to visit.    Allergies  Allergen Reactions  . Lisinopril Cough    Review of Systems  Constitutional:  Negative for chills, fever and malaise/fatigue.  HENT:  Negative for congestion and hearing loss.   Eyes:  Negative for blurred vision and discharge.  Respiratory:  Negative for cough, sputum production and shortness of breath.   Cardiovascular:  Negative for chest pain, palpitations and leg swelling.  Gastrointestinal:  Negative for abdominal pain, blood in stool, constipation, diarrhea, heartburn, nausea and vomiting.  Genitourinary:  Negative for dysuria, frequency, hematuria and urgency.  Musculoskeletal:  Positive for joint pain. Negative for back pain, falls and myalgias.  Skin:  Negative for rash.  Neurological:  Negative for dizziness, sensory change, loss of consciousness, weakness and headaches.   Endo/Heme/Allergies:  Negative for environmental allergies. Does not bruise/bleed easily.  Psychiatric/Behavioral:  Negative for depression and suicidal ideas. The patient has insomnia. The patient is not nervous/anxious.        Objective:    Physical Exam Constitutional:      General: She is not in acute distress.    Appearance: Normal appearance. She is not diaphoretic.  HENT:     Head: Normocephalic and atraumatic.     Right Ear: Tympanic membrane, ear canal and external ear normal.     Left Ear: Tympanic membrane, ear canal and external ear normal.     Nose: Nose normal.     Mouth/Throat:     Mouth: Mucous membranes are moist.     Pharynx: Oropharynx is clear. No oropharyngeal exudate.  Eyes:     General: No scleral icterus.       Right eye: No discharge.        Left eye: No discharge.     Conjunctiva/sclera: Conjunctivae normal.     Pupils: Pupils are equal, round, and reactive to light.  Neck:     Thyroid: No thyromegaly.  Cardiovascular:     Rate and Rhythm: Normal rate and regular rhythm.     Heart sounds: Normal heart sounds. No murmur heard. Pulmonary:     Effort: Pulmonary effort is normal. No respiratory distress.     Breath sounds: Normal breath sounds. No wheezing or rales.  Abdominal:     General: Bowel sounds are normal. There is no distension.     Palpations: Abdomen is soft. There is no mass.     Tenderness: There is no abdominal tenderness.  Musculoskeletal:        General: No tenderness. Normal range of motion.  Cervical back: Normal range of motion and neck supple.  Lymphadenopathy:     Cervical: No cervical adenopathy.  Skin:    General: Skin is warm and dry.     Findings: No rash.  Neurological:     General: No focal deficit present.     Mental Status: She is alert and oriented to person, place, and time.     Cranial Nerves: No cranial nerve deficit.     Coordination: Coordination normal.     Deep Tendon Reflexes: Reflexes are normal and  symmetric. Reflexes normal.  Psychiatric:        Mood and Affect: Mood normal.        Behavior: Behavior normal.        Thought Content: Thought content normal.        Judgment: Judgment normal.    BP 128/82 (BP Location: Left Arm, Patient Position: Sitting, Cuff Size: Normal)   Pulse 63   Temp 98 F (36.7 C) (Oral)   Resp 16   Ht 5\' 7"  (1.702 m)   Wt 168 lb 6.4 oz (76.4 kg)   SpO2 96%   BMI 26.38 kg/m  Wt Readings from Last 3 Encounters:  03/07/23 168 lb 6.4 oz (76.4 kg)  07/21/22 168 lb (76.2 kg)  03/09/22 160 lb (72.6 kg)    Diabetic Foot Exam - Simple   No data filed    Lab Results  Component Value Date   WBC 5.6 03/04/2022   HGB 13.3 03/04/2022   HCT 39.8 03/04/2022   PLT 222.0 03/04/2022   GLUCOSE 87 03/04/2022   CHOL 178 03/04/2022   TRIG 192.0 (H) 03/04/2022   HDL 48.80 03/04/2022   LDLCALC 91 03/04/2022   ALT 15 03/04/2022   AST 17 03/04/2022   NA 138 03/04/2022   K 4.2 03/04/2022   CL 100 03/04/2022   CREATININE 0.83 03/04/2022   BUN 20 03/04/2022   CO2 29 03/04/2022   TSH 0.57 03/04/2022    Lab Results  Component Value Date   TSH 0.57 03/04/2022   Lab Results  Component Value Date   WBC 5.6 03/04/2022   HGB 13.3 03/04/2022   HCT 39.8 03/04/2022   MCV 88.4 03/04/2022   PLT 222.0 03/04/2022   Lab Results  Component Value Date   NA 138 03/04/2022   K 4.2 03/04/2022   CO2 29 03/04/2022   GLUCOSE 87 03/04/2022   BUN 20 03/04/2022   CREATININE 0.83 03/04/2022   BILITOT 0.3 03/04/2022   ALKPHOS 61 03/04/2022   AST 17 03/04/2022   ALT 15 03/04/2022   PROT 6.8 03/04/2022   ALBUMIN 4.3 03/04/2022   CALCIUM 9.7 03/04/2022   ANIONGAP 5 03/11/2020   GFR 73.27 03/04/2022   Lab Results  Component Value Date   CHOL 178 03/04/2022   Lab Results  Component Value Date   HDL 48.80 03/04/2022   Lab Results  Component Value Date   LDLCALC 91 03/04/2022   Lab Results  Component Value Date   TRIG 192.0 (H) 03/04/2022   Lab Results   Component Value Date   CHOLHDL 4 03/04/2022   No results found for: "HGBA1C"     Assessment & Plan:  Primary hypertension Assessment & Plan: Well controlled, no changes to meds. Encouraged heart healthy diet such as the DASH diet and exercise as tolerated.   Orders: -     CBC with Differential/Platelet -     TSH  Hyperlipidemia, unspecified hyperlipidemia type Assessment & Plan: Encourage  heart healthy diet such as MIND or DASH diet, increase exercise, avoid trans fats, simple carbohydrates and processed foods, consider a krill or fish or flaxseed oil cap daily.   Orders: -     Lipid panel  Insomnia, unspecified type Assessment & Plan: Encouraged good sleep hygiene such as dark, quiet room. No blue/green glowing lights such as computer screens in bedroom. No alcohol or stimulants in evening. Cut down on caffeine as able. Regular exercise is helpful but not just prior to bed time.     Muscle cramps Assessment & Plan: Hydrate, check labs.   Orders: -     Comprehensive metabolic panel -     Magnesium  Osteoporosis, unspecified osteoporosis type, unspecified pathological fracture presence Assessment & Plan: Encouraged to get adequate exercise, calcium and vitamin d intake. Taking Vitamin D 3 5000 international units  daily  Orders: -     DG Bone Density; Future -     VITAMIN D 25 Hydroxy (Vit-D Deficiency, Fractures)  Preventative health care Assessment & Plan: Patient encouraged to maintain heart healthy diet, regular exercise, adequate sleep. Consider daily probiotics. Take medications as prescribed. Labs ordered and reviewed. Colonoscopy 2021 repeat in 10 years. Dexa 2019 repeat this year. MGM August 2023 repeat every 1-2 years. Pap no longer due to hysterectomy. Reviewed immunizations.  Given and reviewed copy of ACP documents from [facility] Secretary of State and encouraged to complete and return    Nerve damage  Need for hepatitis C screening test -     Hepatitis C  antibody    Assessment and Plan    Left Shoulder Pain Gradual onset, no history of trauma. Pain radiates to the back of the arm. MRI performed and physical therapy scheduled. -Continue with physical therapy as planned with Dr. Markus Jarvis office.  Facial Nerve Damage Drooping of the eyebrow, planning for a surgical lift in November. -Continue with planned surgical procedure on November 12th.  Osteoporosis Last bone density scan performed in November 2019, history of intolerance to Fosamax due to nightmares. -Order a bone density scan. -Consider Prolia injection if bone density has worsened, given intolerance to Fosamax.  General Health Maintenance -Order Hepatitis C screening. -Consider Tetanus shot at [facility], due for 10-year update. -Consider Flu shot and COVID booster in the fall. -Encourage maintaining physical activity, aiming for 4000-8000 steps per day. -Encourage hydration, 60-80 ounces of fluids per day. -Encourage maintaining social connections. -Schedule mammogram for winter, last performed in August. -Next colonoscopy due in 2031, last performed in 2021. -Consider Magnesium Glycinate for sleep issues.         Danise Edge, MD

## 2023-03-07 NOTE — Patient Instructions (Addendum)
T[facility] at [facility]  Flu and COVID boosters each fall  Live to [address] [facility]    Preventive Care 65 Years and Older, Female Preventive care refers to lifestyle choices and visits with your health care provider that can promote health and wellness. Preventive care visits are also called wellness exams. What can I expect for my preventive care visit? Counseling Your health care provider may ask you questions about your: Medical history, including: Past medical problems. Family medical history. Pregnancy and menstrual history. History of falls. Current health, including: Memory and ability to understand (cognition). Emotional well-being. Home life and relationship well-being. Sexual activity and sexual health. Lifestyle, including: Alcohol, nicotine or tobacco, and drug use. Access to firearms. Diet, exercise, and sleep habits. Work and work Astronomer. Sunscreen use. Safety issues such as seatbelt and bike helmet use. Physical exam Your health care provider will check your: Height and weight. These may be used to calculate your BMI (body mass index). BMI is a measurement that tells if you are at a healthy weight. Waist circumference. This measures the distance around your waistline. This measurement also tells if you are at a healthy weight and may help predict your risk of certain diseases, such as type 2 diabetes and high blood pressure. Heart rate and blood pressure. Body temperature. Skin for abnormal spots. What immunizations do I need?  Vaccines are usually given at various ages, according to a schedule. Your health care provider will recommend vaccines for you based on your age, medical history, and lifestyle or other factors, such as travel or where you work. What tests do I need? Screening Your health care provider may recommend screening tests for certain conditions. This may include: Lipid and cholesterol levels. Hepatitis C test. Hepatitis B test. HIV (human  immunodeficiency virus) test. STI (sexually transmitted infection) testing, if you are at risk. Lung cancer screening. Colorectal cancer screening. Diabetes screening. This is done by checking your blood sugar (glucose) after you have not eaten for a while (fasting). Mammogram. Talk with your health care provider about how often you should have regular mammograms. BRCA-related cancer screening. This may be done if you have a family history of breast, ovarian, tubal, or peritoneal cancers. Bone density scan. This is done to screen for osteoporosis. Talk with your health care provider about your test results, treatment options, and if necessary, the need for more tests. Follow these instructions at home: Eating and drinking  Eat a diet that includes fresh fruits and vegetables, whole grains, lean protein, and low-fat dairy products. Limit your intake of foods with high amounts of sugar, saturated fats, and salt. Take vitamin and mineral supplements as recommended by your health care provider. Do not drink alcohol if your health care provider tells you not to drink. If you drink alcohol: Limit how much you have to 0-1 drink a day. Know how much alcohol is in your drink. In the [place] one drink equals one 12 oz bottle of beer (355 mL), one 5 oz glass of wine (148 mL), or one 1 oz glass of hard liquor (44 mL). Lifestyle Brush your teeth every morning and night with fluoride toothpaste. Floss one time each day. Exercise for at least 30 minutes 5 or more days each week. Do not use any products that contain nicotine or tobacco. These products include cigarettes, chewing tobacco, and vaping devices, such as e-cigarettes. If you need help quitting, ask your health care provider. Do not use drugs. If you are sexually active, practice safe  sex. Use a condom or other form of protection in order to prevent STIs. Take aspirin only as told by your health care provider. Make sure that you understand how much  to take and what form to take. Work with your health care provider to find out whether it is safe and beneficial for you to take aspirin daily. Ask your health care provider if you need to take a cholesterol-lowering medicine (statin). Find healthy ways to manage stress, such as: Meditation, yoga, or listening to music. Journaling. Talking to a trusted person. Spending time with friends and family. Minimize exposure to UV radiation to reduce your risk of skin cancer. Safety Always wear your seat belt while driving or riding in a vehicle. Do not drive: If you have been drinking alcohol. Do not ride with someone who has been drinking. When you are tired or distracted. While texting. If you have been using any mind-altering substances or drugs. Wear a helmet and other protective equipment during sports activities. If you have firearms in your house, make sure you follow all gun safety procedures. What's next? Visit your health care provider once a year for an annual wellness visit. Ask your health care provider how often you should have your eyes and teeth checked. Stay up to date on all vaccines. This information is not intended to replace advice given to you by your health care provider. Make sure you discuss any questions you have with your health care provider. Document Revised: 12/24/2020 Document Reviewed: 12/24/2020 Elsevier Patient Education  2024 [employer].

## 2023-03-08 LAB — LIPID PANEL
Cholesterol: 156 mg/dL (ref 0–200)
HDL: 41.8 mg/dL (ref >39.00)
NonHDL: 114.34
Total CHOL/HDL Ratio: 4
Triglycerides: 223 mg/dL — ABNORMAL HIGH (ref 0.0–149.0)
VLDL: 44.6 mg/dL — ABNORMAL HIGH (ref 0.0–40.0)

## 2023-03-08 LAB — CBC WITH DIFFERENTIAL/PLATELET
Basophils Absolute: 0 10*3/uL (ref 0.0–0.1)
Basophils Relative: 0.7 % (ref 0.0–3.0)
Eosinophils Absolute: 0.2 10*3/uL (ref 0.0–0.7)
Eosinophils Relative: 3.9 % (ref 0.0–5.0)
HCT: 38.7 % (ref 36.0–46.0)
Hemoglobin: 12.8 g/dL (ref 12.0–15.0)
Lymphocytes Relative: 16.8 % (ref 12.0–46.0)
Lymphs Abs: 1.1 10*3/uL (ref 0.7–4.0)
MCHC: 33.1 g/dL (ref 30.0–36.0)
MCV: 89.3 fl (ref 78.0–100.0)
Monocytes Absolute: 0.4 10*3/uL (ref 0.1–1.0)
Monocytes Relative: 7 % (ref 3.0–12.0)
Neutro Abs: 4.5 10*3/uL (ref 1.4–7.7)
Neutrophils Relative %: 71.6 % (ref 43.0–77.0)
Platelets: 240 10*3/uL (ref 150.0–400.0)
RBC: 4.34 Mil/uL (ref 3.87–5.11)
RDW: 13.3 % (ref 11.5–15.5)
WBC: 6.3 10*3/uL (ref 4.0–10.5)

## 2023-03-08 LAB — COMPREHENSIVE METABOLIC PANEL
ALT: 15 U/L (ref 0–35)
AST: 17 U/L (ref 0–37)
Albumin: 4.1 g/dL (ref 3.5–5.2)
Alkaline Phosphatase: 75 U/L (ref 39–117)
BUN: 21 mg/dL (ref 6–23)
CO2: 28 meq/L (ref 19–32)
Calcium: 9.4 mg/dL (ref 8.4–10.5)
Chloride: 100 meq/L (ref 96–112)
Creatinine, Ser: 0.85 mg/dL (ref 0.40–1.20)
GFR: 70.7 mL/min (ref >60.00)
Glucose, Bld: 86 mg/dL (ref 70–99)
Potassium: 4.4 mEq/L (ref 3.5–5.1)
Sodium: 138 mEq/L (ref 135–145)
Total Bilirubin: 0.3 mg/dL (ref 0.2–1.2)
Total Protein: 6.5 g/dL (ref 6.0–8.3)

## 2023-03-08 LAB — MAGNESIUM: Magnesium: 2 mg/dL (ref 1.5–2.5)

## 2023-03-08 LAB — TSH: TSH: 0.96 u[IU]/mL (ref 0.35–5.50)

## 2023-03-08 LAB — LDL CHOLESTEROL, DIRECT: Direct LDL: 82 mg/dL

## 2023-03-08 LAB — VITAMIN D 25 HYDROXY (VIT D DEFICIENCY, FRACTURES): VITD: 66.36 ng/mL (ref 30.00–100.00)

## 2023-03-08 LAB — HEPATITIS C ANTIBODY: Hepatitis C Ab: NONREACTIVE

## 2023-03-10 DIAGNOSIS — M7542 Impingement syndrome of left shoulder: Secondary | ICD-10-CM | POA: Diagnosis not present

## 2023-03-10 DIAGNOSIS — M25512 Pain in left shoulder: Secondary | ICD-10-CM | POA: Diagnosis not present

## 2023-03-12 ENCOUNTER — Other Ambulatory Visit: Payer: Self-pay | Admitting: Family Medicine

## 2023-03-12 DIAGNOSIS — I10 Essential (primary) hypertension: Secondary | ICD-10-CM

## 2023-03-12 LAB — GENECONNECT MOLECULAR SCREEN

## 2023-03-12 LAB — HELIX MOLECULAR SCREEN: Genetic Analysis Overall Interpretation: NEGATIVE

## 2023-03-17 ENCOUNTER — Ambulatory Visit (HOSPITAL_BASED_OUTPATIENT_CLINIC_OR_DEPARTMENT_OTHER)
Admission: RE | Admit: 2023-03-17 | Discharge: 2023-03-17 | Disposition: A | Discharge status: Home / Self Care | Payer: No Typology Code available for payment source | Source: Ambulatory Visit | Attending: Family Medicine | Admitting: Family Medicine

## 2023-03-17 DIAGNOSIS — M810 Age-related osteoporosis without current pathological fracture: Secondary | ICD-10-CM | POA: Insufficient documentation

## 2023-03-17 DIAGNOSIS — Z780 Asymptomatic menopausal state: Secondary | ICD-10-CM | POA: Diagnosis not present

## 2023-03-18 DIAGNOSIS — M7542 Impingement syndrome of left shoulder: Secondary | ICD-10-CM | POA: Diagnosis not present

## 2023-03-18 DIAGNOSIS — M25512 Pain in left shoulder: Secondary | ICD-10-CM | POA: Diagnosis not present

## 2023-03-22 ENCOUNTER — Telehealth: Payer: Self-pay | Admitting: Family Medicine

## 2023-03-22 NOTE — Telephone Encounter (Signed)
Copied from CRM [id number]. Topic: Medicare AWV >> Mar 22, 2023 11:06 AM Payton Doughty wrote: Reason for CRM: LM 03/22/2023 to R/S AWV appt - [facility]  Verlee Rossetti; Care Guide Ambulatory Clinical Support [place] [facility] Direct Dial: [phone]

## 2023-03-23 ENCOUNTER — Telehealth: Payer: Self-pay

## 2023-03-23 NOTE — Telephone Encounter (Signed)
Dr Abner Greenspan would like for the pt to start Prolia.   Dexa result 03/21/23: "She is correct we did discuss that she had previously tried Fosamax and did not tolerate. Please start the process to get her started on Prolia in office twice a year."

## 2023-03-24 ENCOUNTER — Ambulatory Visit (INDEPENDENT_AMBULATORY_CARE_PROVIDER_SITE_OTHER): Payer: No Typology Code available for payment source | Admitting: *Deleted

## 2023-03-24 DIAGNOSIS — Z0000 Encounter for general adult medical examination without abnormal findings: Secondary | ICD-10-CM | POA: Diagnosis not present

## 2023-03-24 NOTE — Patient Instructions (Signed)
[name] , Thank you for taking time to come for your Medicare Wellness Visit. I appreciate your ongoing commitment to your health goals. Please review the following plan we discussed and let me know if I can assist you in the future.   These are the goals we discussed:  Goals   None     This is a list of the screening recommended for you and due dates:  Health Maintenance  Topic Date Due   COVID-19 Vaccine (6 - 2023-24 season) 03/13/2023   Flu Shot  10/10/2023*   DTaP/Tdap/Td vaccine (2 - Td or Tdap) 05/15/2023   Mammogram  03/08/2024   Medicare Annual Wellness Visit  03/23/2024   Colon Cancer Screening  01/21/2030   Pneumonia Vaccine  Completed   DEXA scan (bone density measurement)  Completed   Hepatitis C Screening  Completed   Zoster (Shingles) Vaccine  Completed   HPV Vaccine  Aged Out  *Topic was postponed. The date shown is not the original due date.     Next appointment: Follow up in one year for your annual wellness visit.   Preventive Care 68 Years and Older, Female Preventive care refers to lifestyle choices and visits with your health care provider that can promote health and wellness. What does preventive care include? A yearly physical exam. This is also called an annual well check. Dental exams once or twice a year. Routine eye exams. Ask your health care provider how often you should have your eyes checked. Personal lifestyle choices, including: Daily care of your teeth and gums. Regular physical activity. Eating a healthy diet. Avoiding tobacco and drug use. Limiting alcohol use. Practicing safe sex. Taking low-dose aspirin every day. Taking vitamin and mineral supplements as recommended by your health care provider. What happens during an annual well check? The services and screenings done by your health care provider during your annual well check will depend on your age, overall health, lifestyle risk factors, and family history of  disease. Counseling  Your health care provider may ask you questions about your: Alcohol use. Tobacco use. Drug use. Emotional well-being. Home and relationship well-being. Sexual activity. Eating habits. History of falls. Memory and ability to understand (cognition). Work and work Astronomer. Reproductive health. Screening  You may have the following tests or measurements: Height, weight, and BMI. Blood pressure. Lipid and cholesterol levels. These may be checked every 5 years, or more frequently if you are over 77 years old. Skin check. Lung cancer screening. You may have this screening every year starting at age 12 if you have a 30-pack-year history of smoking and currently smoke or have quit within the past 15 years. Fecal occult blood test (FOBT) of the stool. You may have this test every year starting at age 41. Flexible sigmoidoscopy or colonoscopy. You may have a sigmoidoscopy every 5 years or a colonoscopy every 10 years starting at age 36. Hepatitis C blood test. Hepatitis B blood test. Sexually transmitted disease (STD) testing. Diabetes screening. This is done by checking your blood sugar (glucose) after you have not eaten for a while (fasting). You may have this done every 1-3 years. Bone density scan. This is done to screen for osteoporosis. You may have this done starting at age 30. Mammogram. This may be done every 1-2 years. Talk to your health care provider about how often you should have regular mammograms. Talk with your health care provider about your test results, treatment options, and if necessary, the need for more tests.  Vaccines  Your health care provider may recommend certain vaccines, such as: Influenza vaccine. This is recommended every year. Tetanus, diphtheria, and acellular pertussis (Tdap, Td) vaccine. You may need a Td booster every 10 years. Zoster vaccine. You may need this after age 2. Pneumococcal 13-valent conjugate (PCV13) vaccine. One  dose is recommended after age 50. Pneumococcal polysaccharide (PPSV23) vaccine. One dose is recommended after age 62. Talk to your health care provider about which screenings and vaccines you need and how often you need them. This information is not intended to replace advice given to you by your health care provider. Make sure you discuss any questions you have with your health care provider. Document Released: 07/25/2015 Document Revised: 03/17/2016 Document Reviewed: 04/29/2015 Elsevier Interactive Patient Education  2017 [employer].  Fall Prevention in the Home Falls can cause injuries. They can happen to people of all ages. There are many things you can do to make your home safe and to help prevent falls. What can I do on the outside of my home? Regularly fix the edges of walkways and driveways and fix any cracks. Remove anything that might make you trip as you walk through a door, such as a raised step or threshold. Trim any bushes or trees on the path to your home. Use bright outdoor lighting. Clear any walking paths of anything that might make someone trip, such as rocks or tools. Regularly check to see if handrails are loose or broken. Make sure that both sides of any steps have handrails. Any raised decks and porches should have guardrails on the edges. Have any leaves, snow, or ice cleared regularly. Use sand or salt on walking paths during winter. Clean up any spills in your garage right away. This includes oil or grease spills. What can I do in the bathroom? Use night lights. Install grab bars by the toilet and in the tub and shower. Do not use towel bars as grab bars. Use non-skid mats or decals in the tub or shower. If you need to sit down in the shower, use a plastic, non-slip stool. Keep the floor dry. Clean up any water that spills on the floor as soon as it happens. Remove soap buildup in the tub or shower regularly. Attach bath mats securely with double-sided  non-slip rug tape. Do not have throw rugs and other things on the floor that can make you trip. What can I do in the bedroom? Use night lights. Make sure that you have a light by your bed that is easy to reach. Do not use any sheets or blankets that are too big for your bed. They should not hang down onto the floor. Have a firm chair that has side arms. You can use this for support while you get dressed. Do not have throw rugs and other things on the floor that can make you trip. What can I do in the kitchen? Clean up any spills right away. Avoid walking on wet floors. Keep items that you use a lot in easy-to-reach places. If you need to reach something above you, use a strong step stool that has a grab bar. Keep electrical cords out of the way. Do not use floor polish or wax that makes floors slippery. If you must use wax, use non-skid floor wax. Do not have throw rugs and other things on the floor that can make you trip. What can I do with my stairs? Do not leave any items on the stairs. Make sure that  there are handrails on both sides of the stairs and use them. Fix handrails that are broken or loose. Make sure that handrails are as long as the stairways. Check any carpeting to make sure that it is firmly attached to the stairs. Fix any carpet that is loose or worn. Avoid having throw rugs at the top or bottom of the stairs. If you do have throw rugs, attach them to the floor with carpet tape. Make sure that you have a light switch at the top of the stairs and the bottom of the stairs. If you do not have them, ask someone to add them for you. What else can I do to help prevent falls? Wear shoes that: Do not have high heels. Have rubber bottoms. Are comfortable and fit you well. Are closed at the toe. Do not wear sandals. If you use a stepladder: Make sure that it is fully opened. Do not climb a closed stepladder. Make sure that both sides of the stepladder are locked into place. Ask  someone to hold it for you, if possible. Clearly mark and make sure that you can see: Any grab bars or handrails. First and last steps. Where the edge of each step is. Use tools that help you move around (mobility aids) if they are needed. These include: Canes. Walkers. Scooters. Crutches. Turn on the lights when you go into a dark area. Replace any light bulbs as soon as they burn out. Set up your furniture so you have a clear path. Avoid moving your furniture around. If any of your floors are uneven, fix them. If there are any pets around you, be aware of where they are. Review your medicines with your doctor. Some medicines can make you feel dizzy. This can increase your chance of falling. Ask your doctor what other things that you can do to help prevent falls. This information is not intended to replace advice given to you by your health care provider. Make sure you discuss any questions you have with your health care provider. Document Released: 04/24/2009 Document Revised: 12/04/2015 Document Reviewed: 08/02/2014 Elsevier Interactive Patient Education  2017 [employer].

## 2023-03-24 NOTE — Progress Notes (Signed)
Subjective:   Ann Barton is a 67 y.o. female who presents for Medicare Annual (Subsequent) preventive examination.  Visit Complete: Virtual  I connected with  Mitzi Davenport on 03/24/23 by a audio enabled telemedicine application and verified that I am speaking with the correct person using two identifiers.  Patient Location: Home  Provider Location: Office/Clinic  I discussed the limitations of evaluation and management by telemedicine. The patient expressed understanding and agreed to proceed.  Patient Medicare AWV questionnaire was completed by the patient on 03/23/23; I have confirmed that all information answered by patient is correct and no changes since this date.  Review of Systems     Cardiac Risk Factors include: advanced age (>58men, >77 women);dyslipidemia;hypertension     Objective:    Vital Signs: Unable to obtain new vitals due to this being a telehealth visit.      03/24/2023    1:42 PM 03/12/2022    9:03 AM 03/11/2020    8:59 AM 11/06/2019    8:00 PM 01/11/2018    1:15 PM  Advanced Directives  Does Patient Have a Medical Advance Directive? Yes Yes Yes Yes Yes  Type of Estate agent of [place];Living will Healthcare Power of [place];Living will Living will;Healthcare Power of [employer] Power of [place];Living will Healthcare Power of [place];Living will  Does patient want to make changes to medical advance directive? No - Patient declined No - Patient declined  No - Patient declined   Copy of Healthcare Power of Attorney in Chart? No - copy requested No - copy requested No - copy requested  No - copy requested    Current Medications (verified) Outpatient Encounter Medications as of 03/24/2023  Medication Sig   Calcium Carbonate-Vit D-Min (CALCIUM 600+D PLUS MINERALS) 600-400 MG-UNIT TABS Take by mouth.   celecoxib (CELEBREX) 200 MG capsule Take 1 tablet by mouth daily.   Cholecalciferol (VITAMIN D3) 5000 UNITS CAPS Take 1 capsule  by mouth daily.    Coenzyme Q10 (COQ-10) 200 MG CAPS Take 1 tablet by mouth daily.    estradiol (ESTRACE) 0.1 MG/GM vaginal cream INSERT 1 GRAM VAGINALLY TWICE WEEKLY   loratadine (CLARITIN) 10 MG tablet Take 10 mg by mouth daily.   metoprolol succinate (TOPROL-XL) 100 MG 24 hr tablet TAKE 1 TABLET BY MOUTH EVERY DAY WITH OR IMMEDIATELY FOLLOWING A MEAL   Multiple Vitamin (MULTIVITAMIN) tablet Take 1 tablet by mouth daily.   Probiotic Product (PROBIOTIC DAILY PO) Take 1 tablet by mouth daily.    Red Yeast Rice Extract (RED YEAST RICE PO) Take 1 capsule by mouth daily.   triamterene-hydrochlorothiazide (MAXZIDE-25) 37.5-25 MG tablet TAKE 1 TABLET BY MOUTH EVERY DAY   No facility-administered encounter medications on file as of 03/24/2023.    Allergies (verified) Lisinopril   History: Past Medical History:  Diagnosis Date   Allergy    seasonal   Arthritis    left hip, low back   Atrophic vaginitis 01/25/2011   Bladder prolapse, female, acquired 05/16/2013   Blood transfusion without reported diagnosis    Chicken pox as a child   Compression fracture of fourth lumbar vertebra (HCC) 04/25/2018   Cough 04/19/2012   Diverticulosis    Female sexual dysfunction 01/25/2011   Gallstones    GI bleed    Goiter    Heart murmur    Hypertension    Measles as a child   Mumps as a child   Neutropenia 03/10/2011   Osteoporosis    Other and  unspecified hyperlipidemia 08/23/2013   Other malaise and fatigue 08/26/2013   Pharyngitis, acute 03/09/2011   Urinary frequency 04/27/2011   Vaginitis 04/27/2011   Past Surgical History:  Procedure Laterality Date   BIOPSY THYROID     FNA, benign   BREAST BIOPSY Left    FNA, benign   BREAST EXCISIONAL BIOPSY Left    CHOLECYSTECTOMY  1998   COLONOSCOPY     more than 10 years ago. [place] Spaniel it was done at [facility]   ERCP  1999   ESOPHAGOGASTRODUODENOSCOPY  1999   with the ERCP as well for her gallbladder   HERNIA REPAIR Right    right,  inguinal   in grown toenails removed     partial, b/l   laser surgery on vericose veins     b/l   NASAL SEPTUM SURGERY  2015   skin cancer removal face     TONSILLECTOMY  as a child   VAGINAL HYSTERECTOMY  1988   done for polapse   VARICOSE VEIN SURGERY     Family History  Problem Relation Age of Onset   Atrial fibrillation Mother    Obesity Mother    Diabetes Mother 96       Type 2   Arthritis Mother        in right knee   Heart disease Mother        atrial fibrillation, CHF   Hernia Mother        never recovered from repair   Hypertension Father    Ulcers Father    Pulmonary fibrosis Father    Atrial fibrillation Brother    Colon cancer Maternal Aunt    Diabetes Maternal Grandmother        type 2?   Heart attack Maternal Grandmother    Hearing loss Maternal Grandmother        MI   Heart disease Maternal Grandmother    Parkinsonism Maternal Grandfather    Stroke Paternal Grandmother    Esophageal cancer Neg Hx    Social History   Socioeconomic History   Marital status: Widowed    Spouse name: Not on file   Number of children: 2   Years of education: Not on file   Highest education level: Not on file  Occupational History   Occupation: NICU    Employer: [employer]  Tobacco Use   Smoking status: Former    Current packs/day: 0.00    Average packs/day: 1 pack/day for 5.0 years (5.0 ttl pk-yrs)    Types: Cigarettes    Start date: 79    Quit date: 07/12/1976    Years since quitting: 46.7   Smokeless tobacco: Never  Vaping Use   Vaping status: Never Used  Substance and Sexual Activity   Alcohol use: Yes    Comment: Light-1-2 drinks/week, wine, beer   Drug use: No   Sexual activity: Not Currently    Partners: Male    Birth control/protection: Surgical    Comment: hysterectomy  Other Topics Concern   Not on file  Social History Narrative   Not on file   Social Determinants of Health   Financial Resource Strain: Low Risk  (03/23/2023)   Overall  Financial Resource Strain (CARDIA)    Difficulty of Paying Living Expenses: Not hard at all  Food Insecurity: No Food Insecurity (03/23/2023)   Hunger Vital Sign    Worried About Running Out of Food in the Last Year: Never true    Ran Out of Food  in the Last Year: Never true  Transportation Needs: No Transportation Needs (03/23/2023)   PRAPARE - Administrator, Civil Service (Medical): No    Lack of Transportation (Non-Medical): No  Physical Activity: Sufficiently Active (03/23/2023)   Exercise Vital Sign    Days of Exercise per Week: 5 days    Minutes of Exercise per Session: 40 min  Stress: No Stress Concern Present (03/23/2023)   [employer] of Occupational Health - Occupational Stress Questionnaire    Feeling of Stress : Only a little  Social Connections: Unknown (03/23/2023)   Social Connection and Isolation Panel [NHANES]    Frequency of Communication with Friends and Family: Twice a week    Frequency of Social Gatherings with Friends and Family: Three times a week    Attends Religious Services: Not on file    Active Member of Clubs or Organizations: Yes    Attends Club or Organization Meetings: More than 4 times per year    Marital Status: Widowed    Tobacco Counseling Counseling given: Not Answered   Clinical Intake:  Pre-visit preparation completed: Yes  Pain : No/denies pain  Nutritional Risks: None Diabetes: No  How often do you need to have someone help you when you read instructions, pamphlets, or other written materials from your doctor or pharmacy?: 1 - Never  Interpreter Needed?: No  Information entered by :: Donne Anon, CMA   Activities of Daily Living    03/23/2023   10:54 AM  In your present state of health, do you have any difficulty performing the following activities:  Hearing? 0  Vision? 0  Difficulty concentrating or making decisions? 0  Walking or climbing stairs? 0  Dressing or bathing? 0  Doing errands, shopping? 0   Preparing Food and eating ? N  Using the Toilet? N  In the past six months, have you accidently leaked urine? N  Do you have problems with loss of bowel control? N  Managing your Medications? N  Managing your Finances? N  Housekeeping or managing your Housekeeping? N    Patient Care Team: Bradd Canary, MD as PCP - General (Family Medicine) Romualdo Bolk, MD (Inactive) as Consulting Physician (Obstetrics and Gynecology)  Indicate any recent Medical Services you may have received from other than [facility] providers in the past year (date may be approximate).     Assessment:   This is a routine wellness examination for Nattaly.  Hearing/Vision screen No results found.   Goals Addressed   None    Depression Screen    03/24/2023    1:43 PM 03/07/2023    2:23 PM 03/12/2022    9:05 AM 03/04/2022    1:18 PM 03/02/2021    3:28 PM 08/28/2019   10:34 AM 05/25/2018   10:57 AM  PHQ 2/9 Scores  PHQ - 2 Score 0 0 0 0 0 0 2  PHQ- 9 Score  0  0   8    Fall Risk    03/23/2023   10:54 AM 03/07/2023    2:23 PM 03/12/2022    9:03 AM 03/04/2022    1:18 PM 03/02/2021    3:28 PM  Fall Risk   Falls in the past year? 0 0 0 0 0  Number falls in past yr: 0 0 0 0 0  Injury with Fall? 0 0 0 0 0  Risk for fall due to : No Fall Risks  No Fall Risks  No Fall Risks  Follow up  Falls evaluation completed Falls evaluation completed Falls evaluation completed  Falls evaluation completed    MEDICARE RISK AT HOME: Medicare Risk at Home Any stairs in or around the home?: No If so, are there any without handrails?: No Home free of loose throw rugs in walkways, pet beds, electrical cords, etc?: Yes Adequate lighting in your home to reduce risk of falls?: Yes Life alert?: No Use of a cane, walker or w/c?: No Grab bars in the bathroom?: Yes Shower chair or bench in shower?: No Elevated toilet seat or a handicapped toilet?: Yes  TIMED UP AND GO:  Was the test performed?  No    Cognitive Function:         03/24/2023    1:45 PM 03/12/2022    9:13 AM  6CIT Screen  What Year? 0 points 0 points  What month? 0 points 0 points  What time? 0 points 0 points  Count back from 20 0 points 0 points  Months in reverse 0 points 0 points  Repeat phrase 0 points 0 points  Total Score 0 points 0 points    Immunizations Immunization History  Administered Date(s) Administered   Fluad Quad(high Dose 65+) 04/05/2022   Influenza Split 04/11/2012, 04/11/2020   Influenza,inj,Quad PF,6+ Mos 04/16/2019   Influenza-Unspecified 04/11/2013, 04/11/2014, 03/28/2017, 04/18/2017, 04/16/2019, 03/23/2021   PFIZER(Purple Top)SARS-COV-2 Vaccination 07/02/2019, 07/23/2019, 04/08/2020, 10/23/2020   Pfizer Covid-19 Vaccine Bivalent Booster 34yrs & up 04/08/2021   Pneumococcal Conjugate-13 05/14/2013   Pneumococcal Polysaccharide-23 06/26/2020   Respiratory Syncytial Virus Vaccine,Recomb Aduvanted(Arexvy) 03/24/2022   Tdap 05/14/2013   Zoster Recombinant(Shingrix) 01/29/2021   Zoster, Unspecified 07/17/2021    TDAP status: Up to date  Flu Vaccine status: Due, Education has been provided regarding the importance of this vaccine. Advised may receive this vaccine at local pharmacy or Health Dept. Aware to provide a copy of the vaccination record if obtained from local pharmacy or Health Dept. Verbalized acceptance and understanding.  Pneumococcal vaccine status: Up to date  Covid-19 vaccine status: Information provided on how to obtain vaccines.   Qualifies for Shingles Vaccine? Yes   Zostavax completed No   Shingrix Completed?: Yes  Screening Tests Health Maintenance  Topic Date Due   COVID-19 Vaccine (6 - 2023-24 season) 03/13/2023   Medicare Annual Wellness (AWV)  03/13/2023   INFLUENZA VACCINE  10/10/2023 (Originally 02/10/2023)   DTaP/Tdap/Td (2 - Td or Tdap) 05/15/2023   MAMMOGRAM  03/08/2024   Colonoscopy  01/21/2030   Pneumonia Vaccine 48+ Years old  Completed   DEXA SCAN  Completed   Hepatitis  C Screening  Completed   Zoster Vaccines- Shingrix  Completed   HPV VACCINES  Aged Out    Health Maintenance  Health Maintenance Due  Topic Date Due   COVID-19 Vaccine (6 - 2023-24 season) 03/13/2023   Medicare Annual Wellness (AWV)  03/13/2023    Colorectal cancer screening: Type of screening: Colonoscopy. Completed 01/22/20. Repeat every 10 years  Mammogram status: Completed 03/08/22. Repeat every year  Bone Density status: Completed 03/17/23. Results reflect: Bone density results: OSTEOPOROSIS. Repeat every 2 years.  Lung Cancer Screening: (Low Dose CT Chest recommended if Age 56-80 years, 20 pack-year currently smoking OR have quit w/in 15years.) does not qualify.    Additional Screening:  Hepatitis C Screening: does qualify; Completed 03/07/23  Vision Screening: Recommended annual ophthalmology exams for early detection of glaucoma and other disorders of the eye. Is the patient up to date with their annual eye exam?  Yes  Who  is the provider or what is the name of the office in which the patient attends annual eye exams? [facility] If pt is not established with a provider, would they like to be referred to a provider to establish care? No .   Dental Screening: Recommended annual dental exams for proper oral hygiene  Diabetic Foot Exam: N/a  Community Resource Referral / Chronic Care Management: CRR required this visit?  No   CCM required this visit?  No     Plan:     I have personally reviewed and noted the following in the patient's chart:   Medical and social history Use of alcohol, tobacco or illicit drugs  Current medications and supplements including opioid prescriptions. Patient is not currently taking opioid prescriptions. Functional ability and status Nutritional status Physical activity Advanced directives List of other physicians Hospitalizations, surgeries, and ER visits in previous 12 months Vitals Screenings to include cognitive, depression, and  falls Referrals and appointments  In addition, I have reviewed and discussed with patient certain preventive protocols, quality metrics, and best practice recommendations. A written personalized care plan for preventive services as well as general preventive health recommendations were provided to patient.     Donne Anon, CMA   03/24/2023   After Visit Summary: (MyChart) Due to this being a telephonic visit, the after visit summary with patients personalized plan was offered to patient via MyChart   Nurse Notes: None

## 2023-03-25 DIAGNOSIS — M7542 Impingement syndrome of left shoulder: Secondary | ICD-10-CM | POA: Diagnosis not present

## 2023-03-25 DIAGNOSIS — M25512 Pain in left shoulder: Secondary | ICD-10-CM | POA: Diagnosis not present

## 2023-03-30 DIAGNOSIS — M7542 Impingement syndrome of left shoulder: Secondary | ICD-10-CM | POA: Diagnosis not present

## 2023-04-01 DIAGNOSIS — M7542 Impingement syndrome of left shoulder: Secondary | ICD-10-CM | POA: Diagnosis not present

## 2023-04-01 DIAGNOSIS — M25512 Pain in left shoulder: Secondary | ICD-10-CM | POA: Diagnosis not present

## 2023-04-06 ENCOUNTER — Telehealth: Payer: Self-pay

## 2023-04-06 NOTE — Telephone Encounter (Signed)
Prolia VOB initiated via [email or web address] 

## 2023-04-06 NOTE — Telephone Encounter (Signed)
Prolia BIV in separate encounter. Will route encounter back once benefit verification is complete.

## 2023-04-15 DIAGNOSIS — M7542 Impingement syndrome of left shoulder: Secondary | ICD-10-CM | POA: Diagnosis not present

## 2023-04-15 DIAGNOSIS — M25512 Pain in left shoulder: Secondary | ICD-10-CM | POA: Diagnosis not present

## 2023-04-22 ENCOUNTER — Encounter: Payer: Self-pay | Admitting: Family

## 2023-04-22 ENCOUNTER — Other Ambulatory Visit (HOSPITAL_COMMUNITY): Payer: Self-pay

## 2023-04-22 NOTE — Telephone Encounter (Deleted)
Pt ready for scheduling for PROLIA on or after : 04/22/23  Out-of-pocket cost due at time of visit: $320  Primary: [employer] Prolia co-insurance: 20% Admin fee co-insurance: 0%  Secondary: --- Prolia co-insurance:  Admin fee co-insurance:   Medical Benefit Details: Date Benefits were checked: 04/07/23 Deductible: NO/ Coinsurance: 20%/ Admin Fee: 0%  Prior Auth: --- PA# Expiration Date:   # of doses approved:  Pharmacy benefit: Copay $285 If patient wants fill through the pharmacy benefit please send prescription to: [facility], and include estimated need by date in rx notes. Pharmacy will ship medication directly to the office.  Patient NOT eligible for Prolia Copay Card. Copay Card can make patient's cost as little as $25. Link to apply: [email or web address]  ** This summary of benefits is an estimation of the patient's out-of-pocket cost. Exact cost may very based on individual plan coverage.

## 2023-04-22 NOTE — Telephone Encounter (Signed)
Pharmacy Patient Advocate Encounter   Received notification from  amgen Portal that prior authorization for prolia is required/requested.   Insurance verification completed.   The patient is insured through  [employer]  .   Per test claim: PA required; PA submitted to [facility] via Availity Key/confirmation #/EOC [id number] Status is pending

## 2023-04-26 ENCOUNTER — Other Ambulatory Visit (HOSPITAL_COMMUNITY): Payer: Self-pay

## 2023-04-26 NOTE — Telephone Encounter (Signed)
Pt ready for scheduling for PROLIA on or after : 04/26/23  Out-of-pocket cost due at time of visit: $320  Primary: [employer] Prolia co-insurance: 20% Admin fee co-insurance: 0%  Secondary: --- Prolia co-insurance:  Admin fee co-insurance:   Medical Benefit Details: Date Benefits were checked: 04/22/23 Deductible: NO/ Coinsurance: 20%/ Admin Fee: 0%  Prior Auth: APPROVED PA# [id number] Expiration Date: 04/22/23-04/21/24  Pharmacy benefit: Copay $285 If patient wants fill through the pharmacy benefit please send prescription to:  [facility] , and include estimated need by date in rx notes. Pharmacy will ship medication directly to the office.  Patient NOT eligible for Prolia Copay Card. Copay Card can make patient's cost as little as $25. Link to apply: [email or web address]://[email or web address]  ** This summary of benefits is an estimation of the patient's out-of-pocket cost. Exact cost may very based on individual plan coverage.

## 2023-05-02 NOTE — Telephone Encounter (Signed)
Mychart message sent.

## 2023-05-12 ENCOUNTER — Other Ambulatory Visit: Payer: Self-pay

## 2023-05-12 DIAGNOSIS — T8389XA Other specified complication of genitourinary prosthetic devices, implants and grafts, initial encounter: Secondary | ICD-10-CM

## 2023-05-12 MED ORDER — ESTRADIOL 0.1 MG/GM VA CREA
TOPICAL_CREAM | VAGINAL | 0 refills | Status: DC
Start: 2023-05-12 — End: 2023-09-27

## 2023-05-12 NOTE — Telephone Encounter (Signed)
Med refill request:  estradiol 0.1 mg vaginal cream Last OV:  01/20/23 Last AEX: 03/12/21 Next OV: 12/24/23 for 6 month pessary check Last MMG (if hormonal med) 03/08/22 Refill sent to provider for approval/denial.

## 2023-05-26 DIAGNOSIS — C44309 Unspecified malignant neoplasm of skin of other parts of face: Secondary | ICD-10-CM | POA: Diagnosis not present

## 2023-05-26 DIAGNOSIS — C44319 Basal cell carcinoma of skin of other parts of face: Secondary | ICD-10-CM | POA: Diagnosis not present

## 2023-05-26 DIAGNOSIS — Z789 Other specified health status: Secondary | ICD-10-CM | POA: Diagnosis not present

## 2023-05-26 DIAGNOSIS — L905 Scar conditions and fibrosis of skin: Secondary | ICD-10-CM | POA: Diagnosis not present

## 2023-05-26 DIAGNOSIS — R208 Other disturbances of skin sensation: Secondary | ICD-10-CM | POA: Diagnosis not present

## 2023-05-26 DIAGNOSIS — C4339 Malignant melanoma of other parts of face: Secondary | ICD-10-CM | POA: Diagnosis not present

## 2023-05-26 DIAGNOSIS — C44329 Squamous cell carcinoma of skin of other parts of face: Secondary | ICD-10-CM | POA: Diagnosis not present

## 2023-06-10 ENCOUNTER — Other Ambulatory Visit: Payer: Self-pay | Admitting: Family Medicine

## 2023-06-10 DIAGNOSIS — I10 Essential (primary) hypertension: Secondary | ICD-10-CM

## 2023-06-14 DIAGNOSIS — L821 Other seborrheic keratosis: Secondary | ICD-10-CM | POA: Diagnosis not present

## 2023-06-14 DIAGNOSIS — L814 Other melanin hyperpigmentation: Secondary | ICD-10-CM | POA: Diagnosis not present

## 2023-06-14 DIAGNOSIS — Z85828 Personal history of other malignant neoplasm of skin: Secondary | ICD-10-CM | POA: Diagnosis not present

## 2023-06-14 DIAGNOSIS — D225 Melanocytic nevi of trunk: Secondary | ICD-10-CM | POA: Diagnosis not present

## 2023-06-14 DIAGNOSIS — L239 Allergic contact dermatitis, unspecified cause: Secondary | ICD-10-CM | POA: Diagnosis not present

## 2023-06-14 DIAGNOSIS — Z08 Encounter for follow-up examination after completed treatment for malignant neoplasm: Secondary | ICD-10-CM | POA: Diagnosis not present

## 2023-06-22 DIAGNOSIS — H2513 Age-related nuclear cataract, bilateral: Secondary | ICD-10-CM | POA: Diagnosis not present

## 2023-06-23 DIAGNOSIS — H524 Presbyopia: Secondary | ICD-10-CM | POA: Diagnosis not present

## 2023-06-23 DIAGNOSIS — H52223 Regular astigmatism, bilateral: Secondary | ICD-10-CM | POA: Diagnosis not present

## 2023-06-30 DIAGNOSIS — Z48817 Encounter for surgical aftercare following surgery on the skin and subcutaneous tissue: Secondary | ICD-10-CM | POA: Diagnosis not present

## 2023-07-26 ENCOUNTER — Ambulatory Visit (INDEPENDENT_AMBULATORY_CARE_PROVIDER_SITE_OTHER): Payer: No Typology Code available for payment source | Admitting: Radiology

## 2023-07-26 VITALS — BP 116/80 | HR 61

## 2023-07-26 DIAGNOSIS — N814 Uterovaginal prolapse, unspecified: Secondary | ICD-10-CM | POA: Diagnosis not present

## 2023-07-26 DIAGNOSIS — Z4689 Encounter for fitting and adjustment of other specified devices: Secondary | ICD-10-CM | POA: Diagnosis not present

## 2023-07-26 NOTE — Progress Notes (Signed)
      Subjective: Ann Barton is a 68 y.o. female G2P2002 here for a pessary check. H/O hysterectomy. She has a #4 ring pessary to control her cystocele. She takes the pessary out at night and replaces it in the am. Uses estrogen cream 2 x a week at hs. Denies vaginal bleeding. No abnormal vaginal discharge. Feels like the bladder is coming out around the pessary.    Review of Systems  All other systems reviewed and are negative.   Past Medical History:  Diagnosis Date   Allergy     seasonal   Arthritis    left hip, low back   Atrophic vaginitis 01/25/2011   Bladder prolapse, female, acquired 05/16/2013   Blood transfusion without reported diagnosis    Chicken pox as a child   Compression fracture of fourth lumbar vertebra (HCC) 04/25/2018   Cough 04/19/2012   Diverticulosis    Female sexual dysfunction 01/25/2011   Gallstones    GI bleed    Goiter    Heart murmur    Hypertension    Measles as a child   Mumps as a child   Neutropenia 03/10/2011   Osteoporosis    Other and unspecified hyperlipidemia 08/23/2013   Other malaise and fatigue 08/26/2013   Pharyngitis, acute 03/09/2011   Urinary frequency 04/27/2011   Vaginitis 04/27/2011      Objective:  Today's Vitals   07/26/23 1042  BP: 116/80  Pulse: 61  SpO2: 92%   There is no height or weight on file to calculate BMI.   Physical Exam Vitals and nursing note reviewed. Exam conducted with a chaperone present.  Constitutional:      Appearance: Normal appearance. She is normal weight.  Pulmonary:     Effort: Pulmonary effort is normal.  Genitourinary:    General: Normal vulva.     Vagina: Normal. No signs of injury. No vaginal discharge, tenderness or lesions.     Uterus: Absent.      Comments: #4 ring pessary with support removed without difficulty cleaned. Refitted with a #6 ring with support for better management. Tolerated well. Vaginal tissues intact. No bleeding or discharge. Neurological:     Mental  Status: She is alert.  Psychiatric:        Mood and Affect: Mood normal.        Thought Content: Thought content normal.        Judgment: Judgment normal.     Darice Hoit, CMA present for exam  Assessment:/Plan:   1. Cystocele with prolapse New pessary provided  2. Pessary maintenance F/u 6 months Continue estrogen cream 2x/week Reminded she does not have to removed nightly. May leave in for a week or two at a time and clean prn.

## 2023-08-15 ENCOUNTER — Telehealth (INDEPENDENT_AMBULATORY_CARE_PROVIDER_SITE_OTHER): Payer: Self-pay | Admitting: Otolaryngology

## 2023-08-15 ENCOUNTER — Ambulatory Visit (INDEPENDENT_AMBULATORY_CARE_PROVIDER_SITE_OTHER): Payer: No Typology Code available for payment source | Admitting: Otolaryngology

## 2023-08-15 NOTE — Telephone Encounter (Signed)
Called and left vm to confirm appt and address for 08/16/2023.

## 2023-08-16 ENCOUNTER — Ambulatory Visit (INDEPENDENT_AMBULATORY_CARE_PROVIDER_SITE_OTHER): Payer: No Typology Code available for payment source | Admitting: Otolaryngology

## 2023-08-16 ENCOUNTER — Encounter (INDEPENDENT_AMBULATORY_CARE_PROVIDER_SITE_OTHER): Payer: Self-pay

## 2023-08-16 VITALS — BP 149/85 | HR 98 | Ht 68.0 in | Wt 164.0 lb

## 2023-08-16 DIAGNOSIS — H6122 Impacted cerumen, left ear: Secondary | ICD-10-CM | POA: Insufficient documentation

## 2023-08-16 DIAGNOSIS — H9313 Tinnitus, bilateral: Secondary | ICD-10-CM | POA: Diagnosis not present

## 2023-08-16 DIAGNOSIS — H9012 Conductive hearing loss, unilateral, left ear, with unrestricted hearing on the contralateral side: Secondary | ICD-10-CM | POA: Insufficient documentation

## 2023-08-16 NOTE — Progress Notes (Signed)
 Patient ID: Ann Barton, female   DOB: 04-Mar-1956, 68 y.o.   MRN: [id number]  Follow-up: Bilateral tinnitus, left ear conductive hearing loss  HPI: The patient is a 68 year old female who returns today for follow-up evaluation.  She was last seen in January 2024.  At that time, she was complaining of bilateral tinnitus.  Her tinnitus was described as a high-pitched ringing noise.  It was nonpulsatile.  She was noted to have retracted left tympanic membrane and left ear conductive hearing loss.  The strategies to cope with tinnitus were discussed.  The patient was instructed to perform Valsalva exercise multiple times a day.  The patient returns today reporting no significant change in her tinnitus or her hearing loss.  The tinnitus is no longer bothering her.  She denies any otalgia, otorrhea, or vertigo.  Exam: General: Communicates without difficulty, well nourished, no acute distress. Head: Normocephalic, no evidence injury, no tenderness, facial buttresses intact without stepoff. Face/sinus: No tenderness to palpation and percussion. Facial movement is normal and symmetric. Eyes: PERRL, EOMI. No scleral icterus, conjunctivae clear. Neuro: CN II exam reveals vision grossly intact.  No nystagmus at any point of gaze. EAC: Left ear cerumen impaction.  The right tympanic membrane and middle ear space are normal. Nose: External evaluation reveals normal support and skin without lesions.  Dorsum is intact.  Anterior rhinoscopy reveals pink mucosa over anterior aspect of inferior turbinates and intact septum.  No purulence noted. Oral:  Oral cavity and oropharynx are intact, symmetric, without erythema or edema.  Mucosa is moist without lesions. Neck: Full range of motion without pain.  There is no significant lymphadenopathy.  No masses palpable.  Thyroid  bed within normal limits to palpation.  Parotid glands and submandibular glands equal bilaterally without mass.  Trachea is midline. Neuro:  CN 2-12 grossly  intact. Gait normal.   Procedure: Left ear cerumen disimpaction Anesthesia: None Description: Under the operating microscope, the cerumen is carefully removed with a combination of cerumen currette, alligator forceps, and suction catheters.  After the cerumen is removed, the left tympanic membrane is significantly retracted.  No mass, erythema, or lesions. The patient tolerated the procedure well.   Assessment: 1.  Left ear cerumen impaction.  After the left ear cerumen removal procedure, the left tympanic membrane is noted to be significantly retracted. 2.  The right tympanic membrane and middle ear space are normal. 3.  Subjectively stable left ear conductive hearing loss. 4.  Bilateral subjective tinnitus.  Plan: 1.  Otomicroscopy with left ear cerumen disimpaction. 2.  The physical exam findings are reviewed with the patient. 3.  The strategies to cope with tinnitus, including the use of masker, hearing aids, tinnitus retraining therapy, and avoidance of caffeine and alcohol are discussed.  4.  The patient will return for reevaluation in 1 year, sooner if needed.

## 2023-09-07 ENCOUNTER — Other Ambulatory Visit: Payer: Self-pay | Admitting: Family Medicine

## 2023-09-27 ENCOUNTER — Other Ambulatory Visit: Payer: Self-pay | Admitting: Radiology

## 2023-09-27 DIAGNOSIS — N898 Other specified noninflammatory disorders of vagina: Secondary | ICD-10-CM

## 2023-09-27 NOTE — Telephone Encounter (Signed)
 Med refill request: estradiol 0.1 mg vaginal cream Last OV: 07/26/23 pessary check Last AEX: 03/12/21  Dr. Oscar La Next OV: 01/24/24 Last MMG (if hormonal med) Refill authorized: estradiol 0.1 mg vaginal cream. Please approve or deny as appropriate.

## 2023-09-27 NOTE — Telephone Encounter (Signed)
 Medication refill request: estrace vaginal cream Last OV:  07-26-23 Next OV: 01-24-24 Last MMG (if hormonal medication request): 03-08-22 birads 1:neg Refill authorized: rx last sent to pharmacy on 05-12-23 42.5 grams with 0 refills. Please approve or deny as appropriate

## 2023-12-08 ENCOUNTER — Other Ambulatory Visit: Payer: Self-pay | Admitting: Family Medicine

## 2023-12-08 DIAGNOSIS — I10 Essential (primary) hypertension: Secondary | ICD-10-CM

## 2024-01-04 ENCOUNTER — Ambulatory Visit (INDEPENDENT_AMBULATORY_CARE_PROVIDER_SITE_OTHER): Admitting: Podiatry

## 2024-01-04 ENCOUNTER — Ambulatory Visit (INDEPENDENT_AMBULATORY_CARE_PROVIDER_SITE_OTHER)

## 2024-01-04 ENCOUNTER — Encounter: Payer: Self-pay | Admitting: Podiatry

## 2024-01-04 DIAGNOSIS — M7752 Other enthesopathy of left foot: Secondary | ICD-10-CM

## 2024-01-04 MED ORDER — MELOXICAM 15 MG PO TABS: 15.0000 mg | ORAL_TABLET | Freq: Every day | ORAL | 0 refills | Status: DC

## 2024-01-04 NOTE — Progress Notes (Signed)
  Subjective:  Patient ID: Ann Barton, female    DOB: May 03, 1956,   MRN: [id number]  Chief Complaint  Patient presents with   Foot Pain    Rm21 Left hallux pain that started 1 month ago/ hurts to flex foot and with pressure no treatment    68 y.o. female presents for concern of left great toe pain that has been ongoing for about a month. Relates it hurts to flex the foot and when walking. Denies any treatments.  . Denies any other pedal complaints. Denies n/v/f/c.   Past Medical History:  Diagnosis Date   Allergy     seasonal   Arthritis    left hip, low back   Atrophic vaginitis 01/25/2011   Bladder prolapse, female, acquired 05/16/2013   Blood transfusion without reported diagnosis    Chicken pox as a child   Compression fracture of fourth lumbar vertebra (HCC) 04/25/2018   Cough 04/19/2012   Diverticulosis    Female sexual dysfunction 01/25/2011   Gallstones    GI bleed    Goiter    Heart murmur    Hypertension    Measles as a child   Mumps as a child   Neutropenia 03/10/2011   Osteoporosis    Other and unspecified hyperlipidemia 08/23/2013   Other malaise and fatigue 08/26/2013   Pharyngitis, acute 03/09/2011   Urinary frequency 04/27/2011   Vaginitis 04/27/2011    Objective:  Physical Exam: Vascular: DP/PT pulses 2/4 bilateral. CFT <3 seconds. Normal hair growth on digits. No edema.  Skin. No lacerations or abrasions bilateral feet.  Musculoskeletal: MMT 5/5 bilateral lower extremities in DF, PF, Inversion and Eversion. Deceased ROM in DF of ankle joint. Tender around the first MPJ mildly. Pain with ROM of the joint moslty in platnarflexion Neurological: Sensation intact to light touch.   Assessment:   1. Capsulitis of metatarsophalangeal (MTP) joint of left foot      Plan:  Patient was evaluated and treated and all questions answered. -Xrays reviewed. Mild and minimal joint space narorwing of first MPJ. No acute fractures or dislocations. Some spurring  and cystic changes noted.  -Discussed hallux limitus and  treatement options; conservative and  Surgical management; risks, benefits, alternatives discussed. All patient's questions answered. -Rx Meloxicam  provided. -Discussed injection deferred today.  -Recommend continue with good supportive shoes and inserts. Discussed stiff soled shoes and use of carbon fiber foot plate.   -Patient to return to office as needed or sooner if condition worsens.   Asberry Failing, DPM

## 2024-01-11 ENCOUNTER — Ambulatory Visit: Admitting: Podiatry

## 2024-01-19 ENCOUNTER — Encounter: Admitting: Family Medicine

## 2024-01-24 ENCOUNTER — Ambulatory Visit (INDEPENDENT_AMBULATORY_CARE_PROVIDER_SITE_OTHER): Payer: No Typology Code available for payment source | Admitting: Radiology

## 2024-01-24 ENCOUNTER — Encounter: Payer: Self-pay | Admitting: Family Medicine

## 2024-01-24 ENCOUNTER — Ambulatory Visit (INDEPENDENT_AMBULATORY_CARE_PROVIDER_SITE_OTHER): Admitting: Family Medicine

## 2024-01-24 ENCOUNTER — Encounter: Payer: Self-pay | Admitting: Radiology

## 2024-01-24 VITALS — BP 138/86 | Wt 166.6 lb

## 2024-01-24 VITALS — BP 134/84 | HR 62 | Temp 98.0°F | Ht 68.0 in | Wt 168.0 lb

## 2024-01-24 DIAGNOSIS — Z1231 Encounter for screening mammogram for malignant neoplasm of breast: Secondary | ICD-10-CM

## 2024-01-24 DIAGNOSIS — E663 Overweight: Secondary | ICD-10-CM | POA: Diagnosis not present

## 2024-01-24 DIAGNOSIS — Z4689 Encounter for fitting and adjustment of other specified devices: Secondary | ICD-10-CM | POA: Diagnosis not present

## 2024-01-24 DIAGNOSIS — Z7689 Persons encountering health services in other specified circumstances: Secondary | ICD-10-CM | POA: Diagnosis not present

## 2024-01-24 DIAGNOSIS — I10 Essential (primary) hypertension: Secondary | ICD-10-CM | POA: Diagnosis not present

## 2024-01-24 DIAGNOSIS — N814 Uterovaginal prolapse, unspecified: Secondary | ICD-10-CM | POA: Diagnosis not present

## 2024-01-24 DIAGNOSIS — E785 Hyperlipidemia, unspecified: Secondary | ICD-10-CM

## 2024-01-24 NOTE — Patient Instructions (Addendum)
-  It was nice to meet you and look forward to taking care of you.  -Ordered labs. Office will call with lab results and will be available on MyChart. -Continue all prescribed mediations.  -Ordered mammogram.  -Follow up in 1 year for physical.

## 2024-01-24 NOTE — Assessment & Plan Note (Signed)
 Stable. Continue Metoprolol  100mg  daily and Trimeterene-HCTZ 37.5-25 daily. Ordered CMP.

## 2024-01-24 NOTE — Progress Notes (Signed)
 New Patient Office Visit  Subjective   Patient ID: Ann Barton, female    DOB: February 07, 1956  Age: 68 y.o. MRN: [id number]  CC:  Chief Complaint  Patient presents with   Establish Care    HPI KYLIAH DEANDA presents to establish care with new provider.   Patients previous primary care provider: [facility] with Dr. CANDIE Horton. Last seen 02/2023.   Specialist: [facility] with Jami Chrzanowski, NP [facility]  [facility] with Asberry Failing (PRN)  [facility] with Dr. Daniel Moccasin   YUW:Rymnwpr. Patient is prescribed Metoprolol  100mg  daily and Trimeterene-HCTZ 37.5-25 daily.  Reports she monitors her BP, ranges around 120s/70s. Denies CP, SHOB, HA, lightheadedness, and lower extremity edema. Reports she has a dizzy episode about 2 year.  BP Readings from Last 3 Encounters:  01/24/24 134/84  01/24/24 138/86  08/16/23 (!) 149/85    Outpatient Encounter Medications as of 01/24/2024  Medication Sig   Calcium Carbonate-Vit D-Min (CALCIUM 600+D PLUS MINERALS) 600-400 MG-UNIT TABS Take by mouth.   Cholecalciferol (VITAMIN D3) 5000 UNITS CAPS Take 1 capsule by mouth daily.    Coenzyme Q10 (COQ-10) 200 MG CAPS Take 1 tablet by mouth daily.    estradiol  (ESTRACE ) 0.1 MG/GM vaginal cream INSERT 1 GRAM VAGINALLY TWICE WEEKLY   loratadine (CLARITIN) 10 MG tablet Take 10 mg by mouth daily.   meloxicam  (MOBIC ) 15 MG tablet Take 1 tablet (15 mg total) by mouth daily.   metoprolol  succinate (TOPROL -XL) 100 MG 24 hr tablet TAKE 1 TABLET BY MOUTH EVERY DAY WITH OR IMMEDIATELY FOLLOWING A MEAL   Multiple Vitamin (MULTIVITAMIN) tablet Take 1 tablet by mouth daily.   Red Yeast Rice Extract (RED YEAST RICE PO) Take 1 capsule by mouth daily.   triamterene -hydrochlorothiazide (MAXZIDE-25) 37.5-25 MG tablet Take 1 tablet by mouth daily.   [DISCONTINUED] Probiotic Product (PROBIOTIC DAILY PO) Take 1 tablet by mouth daily.    No facility-administered encounter medications  on file as of 01/24/2024.    Past Medical History:  Diagnosis Date   Allergy     seasonal   Arthritis    left hip, low back   Atrophic vaginitis 01/25/2011   Bladder prolapse, female, acquired 05/16/2013   Blood transfusion without reported diagnosis 11/07/2019   Chicken pox as a child   Compression fracture of fourth lumbar vertebra (HCC) 04/25/2018   Cough 04/19/2012   Diverticulosis    Female sexual dysfunction 01/25/2011   Gallstones    GI bleed    Goiter    Heart murmur    Hypertension    Measles as a child   Mumps as a child   Neutropenia 03/10/2011   Osteoporosis    Other and unspecified hyperlipidemia 08/23/2013   Other malaise and fatigue 08/26/2013   Pharyngitis, acute 03/09/2011   Urinary frequency 04/27/2011   Vaginitis 04/27/2011    Past Surgical History:  Procedure Laterality Date   BIOPSY THYROID      FNA, benign   BREAST BIOPSY Left    FNA, benign   BREAST EXCISIONAL BIOPSY Left    CHOLECYSTECTOMY  1998   COLONOSCOPY     more than 10 years ago. [place] it was done at [facility]   ERCP  1999   ESOPHAGOGASTRODUODENOSCOPY  1999   with the ERCP as well for her gallbladder   HERNIA REPAIR Right 1992?   right, inguinal   in grown toenails removed     partial, b/l  laser surgery on vericose veins     b/l   NASAL SEPTUM SURGERY  2015   skin cancer removal face     TONSILLECTOMY  as a child   VAGINAL HYSTERECTOMY  1988   done for polapse   VARICOSE VEIN SURGERY      Family History  Problem Relation Age of Onset   Atrial fibrillation Mother    Obesity Mother    Diabetes Mother 33       Type 2   Arthritis Mother        in right knee   Heart disease Mother        atrial fibrillation, CHF   Hernia Mother        never recovered from repair   Hypertension Father    Ulcers Father    Pulmonary fibrosis Father    Cancer Brother        Lung   Atrial fibrillation Brother    Colon cancer Maternal Aunt    Diabetes Maternal Grandmother        type  2?   Heart attack Maternal Grandmother    Hearing loss Maternal Grandmother        MI   Heart disease Maternal Grandmother    Parkinsonism Maternal Grandfather    Stroke Paternal Grandmother    Esophageal cancer Neg Hx     Social History   Socioeconomic History   Marital status: Widowed    Spouse name: Not on file   Number of children: 2   Years of education: Not on file   Highest education level: Associate degree: academic program  Occupational History   Occupation: NICU    Employer:   Tobacco Use   Smoking status: Former    Current packs/day: 0.00    Average packs/day: 1 pack/day for 5.0 years (5.0 ttl pk-yrs)    Types: Cigarettes    Start date: 52    Quit date: 07/12/1976    Years since quitting: 47.5   Smokeless tobacco: Never  Vaping Use   Vaping status: Never Used  Substance and Sexual Activity   Alcohol use: Yes    Comment: Light-1-2 drinks/week, wine, beer   Drug use: No   Sexual activity: Not Currently    Partners: Male    Birth control/protection: Surgical    Comment: hysterectomy  Other Topics Concern   Not on file  Social History Narrative   Not on file   Social Drivers of Health   Financial Resource Strain: Low Risk  (01/23/2024)   Overall Financial Resource Strain (CARDIA)    Difficulty of Paying Living Expenses: Not hard at all  Food Insecurity: No Food Insecurity (01/23/2024)   Hunger Vital Sign    Worried About Running Out of Food in the Last Year: Never true    Ran Out of Food in the Last Year: Never true  Transportation Needs: No Transportation Needs (01/23/2024)   PRAPARE - Administrator, Civil Service (Medical): No    Lack of Transportation (Non-Medical): No  Physical Activity: Insufficiently Active (01/23/2024)   Exercise Vital Sign    Days of Exercise per Week: 3 days    Minutes of Exercise per Session: 30 min  Stress: No Stress Concern Present (01/23/2024)   [employer] of Occupational Health - Occupational  Stress Questionnaire    Feeling of Stress: Not at all  Social Connections: Moderately Integrated (01/23/2024)   Social Connection and Isolation Panel    Frequency of Communication with Friends  and Family: More than three times a week    Frequency of Social Gatherings with Friends and Family: Once a week    Attends Religious Services: 1 to 4 times per year    Active Member of [employer] or Organizations: Yes    Attends Banker Meetings: More than 4 times per year    Marital Status: Widowed  Intimate Partner Violence: Not At Risk (03/24/2023)   Humiliation, Afraid, Rape, and Kick questionnaire    Fear of Current or Ex-Partner: No    Emotionally Abused: No    Physically Abused: No    Sexually Abused: No    ROS See HPI above    Objective  BP 134/84   Pulse 62   Temp 98 F (36.7 C) (Oral)   Ht 5' 8 (1.727 m)   Wt 168 lb (76.2 kg)   SpO2 98%   BMI 25.54 kg/m   Physical Exam Vitals reviewed.  Constitutional:      General: She is not in acute distress.    Appearance: Normal appearance. She is not ill-appearing, toxic-appearing or diaphoretic.  HENT:     Head: Normocephalic and atraumatic.  Eyes:     General:        Right eye: No discharge.        Left eye: No discharge.     Conjunctiva/sclera: Conjunctivae normal.  Cardiovascular:     Rate and Rhythm: Normal rate and regular rhythm.     Heart sounds: Normal heart sounds. No murmur heard.    No friction rub. No gallop.  Pulmonary:     Effort: Pulmonary effort is normal. No respiratory distress.     Breath sounds: Normal breath sounds.  Musculoskeletal:        General: Normal range of motion.     Right lower leg: No edema.     Left lower leg: No edema.  Skin:    General: Skin is warm and dry.  Neurological:     General: No focal deficit present.     Mental Status: She is alert and oriented to person, place, and time. Mental status is at baseline.  Psychiatric:        Mood and Affect: Mood normal.         Behavior: Behavior normal.        Thought Content: Thought content normal.        Judgment: Judgment normal.      Assessment & Plan:  Primary hypertension Assessment & Plan: Stable. Continue Metoprolol  100mg  daily and Trimeterene-HCTZ 37.5-25 daily. Ordered CMP.   Orders: -     Comprehensive metabolic panel with GFR  Hyperlipidemia, unspecified hyperlipidemia type -     Lipid panel  Overweight (BMI 25.0-29.9) -     CBC with Differential/Platelet -     Comprehensive metabolic panel with GFR -     Lipid panel -     TSH  Encounter for screening mammogram for malignant neoplasm of breast -     3D Screening Mammogram, Left and Right; Future  Encounter to establish care   1.Review health maintenance: -Covid booster: Declines  -AWV: needs appt.  -Mammogram: ordered, due 02/2024 2. Ordered labs (CBC, CMP, Lipid panel-not fasting, and TSH) based on dx of HTN, Hyperlipidemia, and BMI-overweight.  3. Continue all medications.  Return in about 1 year (around 01/23/2025) for physical; AWV with Dr. Luke.   Alphonso Gregson, NP

## 2024-01-24 NOTE — Progress Notes (Signed)
      Subjective: Ann Barton is a 68 y.o. female G2P2002 here for a pessary check. H/O hysterectomy. She has a #6 ring pessary to control her cystocele. She takes the pessary out at night and replaces it in the am. Uses estrogen cream 2 x a week at hs. Denies vaginal bleeding. No abnormal vaginal discharge.   Review of Systems  All other systems reviewed and are negative.   Past Medical History:  Diagnosis Date   Allergy     seasonal   Arthritis    left hip, low back   Atrophic vaginitis 01/25/2011   Bladder prolapse, female, acquired 05/16/2013   Blood transfusion without reported diagnosis    Chicken pox as a child   Compression fracture of fourth lumbar vertebra (HCC) 04/25/2018   Cough 04/19/2012   Diverticulosis    Female sexual dysfunction 01/25/2011   Gallstones    GI bleed    Goiter    Heart murmur    Hypertension    Measles as a child   Mumps as a child   Neutropenia 03/10/2011   Osteoporosis    Other and unspecified hyperlipidemia 08/23/2013   Other malaise and fatigue 08/26/2013   Pharyngitis, acute 03/09/2011   Urinary frequency 04/27/2011   Vaginitis 04/27/2011      Objective:  Today's Vitals   01/24/24 1100  BP: 138/86  Weight: 166 lb 9.6 oz (75.6 kg)   Body mass index is 25.33 kg/m.   Physical Exam Vitals and nursing note reviewed. Exam conducted with a chaperone present.  Constitutional:      Appearance: Normal appearance. She is normal weight.  Pulmonary:     Effort: Pulmonary effort is normal.  Genitourinary:    General: Normal vulva.     Vagina: Normal. No signs of injury. No vaginal discharge, tenderness or lesions.     Uterus: Absent.      Comments: #6 ring pessary with support removed without difficulty cleaned. Tolerated well. Vaginal tissues intact. No bleeding or discharge. Neurological:     Mental Status: She is alert.  Psychiatric:        Mood and Affect: Mood normal.        Thought Content: Thought content normal.         Judgment: Judgment normal.     Darice Hoit, CMA present for exam  Assessment:/Plan:   1. Cystocele with prolapse   2. Pessary maintenance F/u 6 months Continue estrogen cream 2x/week Reminded she does not have to removed nightly. May leave in for a week or two at a time and clean prn.

## 2024-01-25 LAB — COMPREHENSIVE METABOLIC PANEL WITH GFR
ALT: 16 U/L (ref 0–35)
AST: 20 U/L (ref 0–37)
Albumin: 4.2 g/dL (ref 3.5–5.2)
Alkaline Phosphatase: 58 U/L (ref 39–117)
BUN: 21 mg/dL (ref 6–23)
CO2: 28 meq/L (ref 19–32)
Calcium: 9.3 mg/dL (ref 8.4–10.5)
Chloride: 93 meq/L — ABNORMAL LOW (ref 96–112)
Creatinine, Ser: 0.97 mg/dL (ref 0.40–1.20)
GFR: 59.97 mL/min — ABNORMAL LOW (ref >60.00)
Glucose, Bld: 93 mg/dL (ref 70–99)
Potassium: 4.3 meq/L (ref 3.5–5.1)
Sodium: 128 meq/L — ABNORMAL LOW (ref 135–145)
Total Bilirubin: 0.3 mg/dL (ref 0.2–1.2)
Total Protein: 6.8 g/dL (ref 6.0–8.3)

## 2024-01-25 LAB — CBC WITH DIFFERENTIAL/PLATELET
Basophils Absolute: 0 K/uL (ref 0.0–0.1)
Basophils Relative: 0.8 % (ref 0.0–3.0)
Eosinophils Absolute: 0.3 K/uL (ref 0.0–0.7)
Eosinophils Relative: 4.8 % (ref 0.0–5.0)
HCT: 36.5 % (ref 36.0–46.0)
Hemoglobin: 12.4 g/dL (ref 12.0–15.0)
Lymphocytes Relative: 22 % (ref 12.0–46.0)
Lymphs Abs: 1.4 K/uL (ref 0.7–4.0)
MCHC: 34 g/dL (ref 30.0–36.0)
MCV: 86.1 fl (ref 78.0–100.0)
Monocytes Absolute: 0.4 K/uL (ref 0.1–1.0)
Monocytes Relative: 6.9 % (ref 3.0–12.0)
Neutro Abs: 4.1 K/uL (ref 1.4–7.7)
Neutrophils Relative %: 65.5 % (ref 43.0–77.0)
Platelets: 251 K/uL (ref 150.0–400.0)
RBC: 4.24 Mil/uL (ref 3.87–5.11)
RDW: 13.4 % (ref 11.5–15.5)
WBC: 6.3 K/uL (ref 4.0–10.5)

## 2024-01-25 LAB — TSH: TSH: 0.81 u[IU]/mL (ref 0.35–5.50)

## 2024-01-25 LAB — LIPID PANEL
Cholesterol: 137 mg/dL (ref 0–200)
HDL: 46.2 mg/dL (ref >39.00)
LDL Cholesterol: 58 mg/dL (ref 0–99)
NonHDL: 90.85
Total CHOL/HDL Ratio: 3
Triglycerides: 165 mg/dL — ABNORMAL HIGH (ref 0.0–149.0)
VLDL: 33 mg/dL (ref 0.0–40.0)

## 2024-01-26 ENCOUNTER — Ambulatory Visit: Payer: Self-pay | Admitting: Family Medicine

## 2024-01-26 DIAGNOSIS — E871 Hypo-osmolality and hyponatremia: Secondary | ICD-10-CM

## 2024-01-30 ENCOUNTER — Other Ambulatory Visit (INDEPENDENT_AMBULATORY_CARE_PROVIDER_SITE_OTHER)

## 2024-01-30 ENCOUNTER — Ambulatory Visit: Payer: Self-pay | Admitting: Family Medicine

## 2024-01-30 DIAGNOSIS — E871 Hypo-osmolality and hyponatremia: Secondary | ICD-10-CM | POA: Diagnosis not present

## 2024-01-30 LAB — COMPREHENSIVE METABOLIC PANEL WITH GFR
ALT: 15 U/L (ref 0–35)
AST: 19 U/L (ref 0–37)
Albumin: 4.4 g/dL (ref 3.5–5.2)
Alkaline Phosphatase: 48 U/L (ref 39–117)
BUN: 24 mg/dL — ABNORMAL HIGH (ref 6–23)
CO2: 28 meq/L (ref 19–32)
Calcium: 9.5 mg/dL (ref 8.4–10.5)
Chloride: 94 meq/L — ABNORMAL LOW (ref 96–112)
Creatinine, Ser: 0.85 mg/dL (ref 0.40–1.20)
GFR: 70.26 mL/min (ref >60.00)
Glucose, Bld: 102 mg/dL — ABNORMAL HIGH (ref 70–99)
Potassium: 3.8 meq/L (ref 3.5–5.1)
Sodium: 131 meq/L — ABNORMAL LOW (ref 135–145)
Total Bilirubin: 0.5 mg/dL (ref 0.2–1.2)
Total Protein: 7 g/dL (ref 6.0–8.3)

## 2024-03-04 ENCOUNTER — Encounter: Payer: Self-pay | Admitting: Family Medicine

## 2024-03-05 ENCOUNTER — Other Ambulatory Visit: Payer: Self-pay

## 2024-03-05 DIAGNOSIS — I10 Essential (primary) hypertension: Secondary | ICD-10-CM

## 2024-03-05 MED ORDER — TRIAMTERENE-HCTZ 37.5-25 MG PO TABS: 1.0000 | ORAL_TABLET | Freq: Every day | ORAL | 0 refills | Status: DC

## 2024-03-05 MED ORDER — METOPROLOL SUCCINATE ER 100 MG PO TB24: 100.0000 mg | ORAL_TABLET | Freq: Every day | ORAL | 1 refills | Status: AC

## 2024-03-21 ENCOUNTER — Other Ambulatory Visit: Payer: Self-pay

## 2024-03-21 DIAGNOSIS — N898 Other specified noninflammatory disorders of vagina: Secondary | ICD-10-CM

## 2024-03-21 MED ORDER — ESTRADIOL 0.1 MG/GM VA CREA: TOPICAL_CREAM | VAGINAL | 2 refills | Status: AC

## 2024-03-21 NOTE — Telephone Encounter (Signed)
 Patient requesting refill so that she can increase to 2x weekly as recommend.

## 2024-03-21 NOTE — Telephone Encounter (Signed)
 Increase the estrogen to nightly x 2 weeks and leave pessary out if possible. If bleeding continues schedule OV

## 2024-04-09 ENCOUNTER — Ambulatory Visit (INDEPENDENT_AMBULATORY_CARE_PROVIDER_SITE_OTHER)

## 2024-04-09 VITALS — Ht 68.0 in | Wt 168.0 lb

## 2024-04-09 DIAGNOSIS — Z0000 Encounter for general adult medical examination without abnormal findings: Secondary | ICD-10-CM

## 2024-04-09 NOTE — Progress Notes (Signed)
 Subjective:   Ann Barton is a 68 y.o. who presents for a Medicare Wellness preventive visit.  As a reminder, Annual Wellness Visits don't include a physical exam, and some assessments may be limited, especially if this visit is performed virtually. We may recommend an in-person follow-up visit with your provider if needed.  Visit Complete: Virtual I connected with  Ann Barton on 04/09/24 by a audio enabled telemedicine application and verified that I am speaking with the correct person using two identifiers.  Patient Location: Home  Provider Location: Home Office  I discussed the limitations of evaluation and management by telemedicine. The patient expressed understanding and agreed to proceed.  Vital Signs: Because this visit was a virtual/telehealth visit, some criteria may be missing or patient reported. Any vitals not documented were not able to be obtained and vitals that have been documented are patient reported.  VideoDeclined- This patient declined Librarian, academic. Therefore the visit was completed with audio only.  Persons Participating in Visit: Patient.  AWV Questionnaire: No: Patient Medicare AWV questionnaire was not completed prior to this visit.  Cardiac Risk Factors include: advanced age (>90men, >63 women);hypertension     Objective:    Today's Vitals   04/09/24 0958  Weight: 168 lb (76.2 kg)  Height: 5' 8 (1.727 m)   Body mass index is 25.54 kg/m.     04/09/2024   10:02 AM 03/24/2023    1:42 PM 03/12/2022    9:03 AM 03/11/2020    8:59 AM 11/06/2019    8:00 PM 01/11/2018    1:15 PM  Advanced Directives  Does Patient Have a Medical Advance Directive? Yes Yes Yes Yes Yes Yes   Type of Estate agent of [place];Living will Healthcare Power of [place];Living will Healthcare Power of [place];Living will Living will;Healthcare Power of [employer] Power of [place];Living will Healthcare Power of  [place];Living will  Does patient want to make changes to medical advance directive?  No - Patient declined No - Patient declined  No - Patient declined   Copy of Healthcare Power of Attorney in Chart? No - copy requested No - copy requested No - copy requested No - copy requested  No - copy requested      Data saved with a previous flowsheet row definition    Current Medications (verified) Outpatient Encounter Medications as of 04/09/2024  Medication Sig   Calcium Carbonate-Vit D-Min (CALCIUM 600+D PLUS MINERALS) 600-400 MG-UNIT TABS Take by mouth.   Cholecalciferol (VITAMIN D3) 5000 UNITS CAPS Take 1 capsule by mouth daily.    Coenzyme Q10 (COQ-10) 200 MG CAPS Take 1 tablet by mouth daily.    estradiol  (ESTRACE ) 0.1 MG/GM vaginal cream INSERT 1 GRAM VAGINALLY TWICE WEEKLY   loratadine (CLARITIN) 10 MG tablet Take 10 mg by mouth daily.   meloxicam  (MOBIC ) 15 MG tablet Take 1 tablet (15 mg total) by mouth daily.   metoprolol  succinate (TOPROL -XL) 100 MG 24 hr tablet Take 1 tablet (100 mg total) by mouth daily. Take with or immediately following a meal.   Multiple Vitamin (MULTIVITAMIN) tablet Take 1 tablet by mouth daily.   Red Yeast Rice Extract (RED YEAST RICE PO) Take 1 capsule by mouth daily.   triamterene -hydrochlorothiazide (MAXZIDE-25) 37.5-25 MG tablet Take 1 tablet by mouth daily.   No facility-administered encounter medications on file as of 04/09/2024.    Allergies (verified) Lisinopril   History: Past Medical History:  Diagnosis Date   Allergy   seasonal   Arthritis    left hip, low back   Atrophic vaginitis 01/25/2011   Bladder prolapse, female, acquired 05/16/2013   Blood transfusion without reported diagnosis 11/07/2019   Chicken pox as a child   Compression fracture of fourth lumbar vertebra (HCC) 04/25/2018   Cough 04/19/2012   Diverticulosis    Female sexual dysfunction 01/25/2011   Gallstones    GI bleed    Goiter    Heart murmur    Hypertension     Measles as a child   Mumps as a child   Neutropenia 03/10/2011   Osteoporosis    Other and unspecified hyperlipidemia 08/23/2013   Other malaise and fatigue 08/26/2013   Pharyngitis, acute 03/09/2011   Urinary frequency 04/27/2011   Vaginitis 04/27/2011   Past Surgical History:  Procedure Laterality Date   BIOPSY THYROID      FNA, benign   BREAST BIOPSY Left    FNA, benign   BREAST EXCISIONAL BIOPSY Left    CHOLECYSTECTOMY  1998   COLONOSCOPY     more than 10 years ago. [place] it was done at [facility]   ERCP  1999   ESOPHAGOGASTRODUODENOSCOPY  1999   with the ERCP as well for her gallbladder   HERNIA REPAIR Right 1992?   right, inguinal   in grown toenails removed     partial, b/l   laser surgery on vericose veins     b/l   NASAL SEPTUM SURGERY  2015   skin cancer removal face     TONSILLECTOMY  as a child   VAGINAL HYSTERECTOMY  1988   done for polapse   VARICOSE VEIN SURGERY     Family History  Problem Relation Age of Onset   Atrial fibrillation Mother    Obesity Mother    Diabetes Mother 69       Type 2   Arthritis Mother        in right knee   Heart disease Mother        atrial fibrillation, CHF   Hernia Mother        never recovered from repair   Hypertension Father    Ulcers Father    Pulmonary fibrosis Father    Cancer Brother        Lung   Atrial fibrillation Brother    Colon cancer Maternal Aunt    Diabetes Maternal Grandmother        type 2?   Heart attack Maternal Grandmother    Hearing loss Maternal Grandmother        MI   Heart disease Maternal Grandmother    Parkinsonism Maternal Grandfather    Stroke Paternal Grandmother    Esophageal cancer Neg Hx    Social History   Socioeconomic History   Marital status: Widowed    Spouse name: Not on file   Number of children: 2   Years of education: Not on file   Highest education level: Associate degree: academic program  Occupational History   Occupation: RETIRED/NICU    Employer: [employer]  Tobacco Use   Smoking status: Former    Current packs/day: 0.00    Average packs/day: 1 pack/day for 5.0 years (5.0 ttl pk-yrs)    Types: Cigarettes    Start date: 63    Quit date: 07/12/1976    Years since quitting: 47.7   Smokeless tobacco: Never  Vaping Use   Vaping status: Never Used  Substance and Sexual Activity   Alcohol use: Yes  Comment: Light-1-2 drinks/week, wine, beer   Drug use: No   Sexual activity: Not Currently    Partners: Male    Birth control/protection: Surgical    Comment: hysterectomy  Other Topics Concern   Not on file  Social History Narrative   Lives alone/2025   Social Drivers of Health   Financial Resource Strain: Low Risk  (04/09/2024)   Overall Financial Resource Strain (CARDIA)    Difficulty of Paying Living Expenses: Not hard at all  Food Insecurity: No Food Insecurity (04/09/2024)   Hunger Vital Sign    Worried About Running Out of Food in the Last Year: Never true    Ran Out of Food in the Last Year: Never true  Transportation Needs: No Transportation Needs (04/09/2024)   PRAPARE - Administrator, Civil Service (Medical): No    Lack of Transportation (Non-Medical): No  Physical Activity: Inactive (04/09/2024)   Exercise Vital Sign    Days of Exercise per Week: 0 days    Minutes of Exercise per Session: 0 min  Stress: No Stress Concern Present (04/09/2024)   Ann Barton of Occupational Health - Occupational Stress Questionnaire    Feeling of Stress: Not at all  Social Connections: Moderately Isolated (04/09/2024)   Social Connection and Isolation Panel    Frequency of Communication with Friends and Family: Three times a week    Frequency of Social Gatherings with Friends and Family: Once a week    Attends Religious Services: Never    Database administrator or Organizations: Yes    Attends Banker Meetings: 1 to 4 times per year    Marital Status: Widowed    Tobacco Counseling Counseling given:  Not Answered    Clinical Intake:  Pre-visit preparation completed: Yes  Pain : No/denies pain     BMI - recorded: 25.54 Nutritional Status: BMI 25 -29 Overweight Nutritional Risks: None Diabetes: No  No results found for: HGBA1C   How often do you need to have someone help you when you read instructions, pamphlets, or other written materials from your doctor or pharmacy?: 1 - Never  Interpreter Needed?: No  Information entered by :: Daxtyn Rottenberg, RMA   Activities of Daily Living     04/09/2024   10:00 AM 01/24/2024    2:27 PM  In your present state of health, do you have any difficulty performing the following activities:  Hearing? 0 0  Vision? 0 0  Difficulty concentrating or making decisions? 0 0  Walking or climbing stairs? 0 0  Dressing or bathing? 0 0  Doing errands, shopping? 0 0  Preparing Food and eating ? N   Using the Toilet? N   In the past six months, have you accidently leaked urine? N   Do you have problems with loss of bowel control? N   Managing your Medications? N   Managing your Finances? N   Housekeeping or managing your Housekeeping? N     Patient Care Team: Billy Philippe SAUNDERS, NP as PCP - General (Family Medicine) Jannis Kate Norris, MD as Consulting Physician (Obstetrics and Gynecology) Karis Clunes, MD as Consulting Physician (Otolaryngology)  I have updated your Care Teams any recent Medical Services you may have received from other providers in the past year.     Assessment:   This is a routine wellness examination for Ann Barton.  Hearing/Vision screen Hearing Screening - Comments:: Denies hearing difficulties   Vision Screening - Comments:: Wears eyeglasses[facility]   Goals Addressed               This Visit's Progress     Patient Stated (pt-stated)        Not at this time/2025       Depression Screen     04/09/2024   10:05 AM 01/24/2024    2:26 PM 03/24/2023    1:43 PM  03/07/2023    2:23 PM 03/12/2022    9:05 AM 03/04/2022    1:18 PM 03/02/2021    3:28 PM  PHQ 2/9 Scores  PHQ - 2 Score 3 0 0 0 0 0 0  PHQ- 9 Score 3 0  0  0     Fall Risk     04/09/2024   10:02 AM 01/24/2024    2:26 PM 03/23/2023   10:54 AM 03/07/2023    2:23 PM 03/12/2022    9:03 AM  Fall Risk   Falls in the past year? 0 0 0 0 0  Number falls in past yr: 0 0 0 0 0  Injury with Fall? 0 0 0 0 0  Risk for fall due to : Impaired balance/gait No Fall Risks No Fall Risks  No Fall Risks  Follow up Falls evaluation completed;Falls prevention discussed Falls evaluation completed Falls evaluation completed Falls evaluation completed Falls evaluation completed      Data saved with a previous flowsheet row definition    MEDICARE RISK AT HOME:  Medicare Risk at Home Any stairs in or around the home?: Yes (3 step outside in front) If so, are there any without handrails?: No Home free of loose throw rugs in walkways, pet beds, electrical cords, etc?: Yes Adequate lighting in your home to reduce risk of falls?: Yes Life alert?: No Use of a cane, walker or w/c?: No Grab bars in the bathroom?: Yes Shower chair or bench in shower?: No Elevated toilet seat or a handicapped toilet?: No  TIMED UP AND GO:  Was the test performed?  No  Cognitive Function: Declined/Normal: No cognitive concerns noted by patient or family. Patient alert, oriented, able to answer questions appropriately and recall recent events. No signs of memory loss or confusion.        03/24/2023    1:45 PM 03/12/2022    9:13 AM  6CIT Screen  What Year? 0 points 0 points  What month? 0 points 0 points  What time? 0 points 0 points  Count back from 20 0 points 0 points  Months in reverse 0 points 0 points  Repeat phrase 0 points 0 points  Total Score 0 points 0 points    Immunizations Immunization History  Administered Date(s) Administered   Fluad Quad(high Dose 65+) 04/05/2022   INFLUENZA, HIGH DOSE SEASONAL PF  04/04/2023, 03/26/2024   Influenza Split 04/11/2012, 04/11/2020   Influenza,inj,Quad PF,6+ Mos 04/16/2019   Influenza-Unspecified 04/11/2013, 04/11/2014, 03/28/2017, 04/18/2017, 04/16/2019, 03/23/2021   Moderna SARS-COV2 Booster Vaccination 03/26/2024   PFIZER(Purple Top)SARS-COV-2 Vaccination 07/02/2019, 07/23/2019, 04/08/2020, 10/23/2020   Pfizer Covid-19 Vaccine Bivalent Booster 38yrs & up 04/08/2021   Pneumococcal Conjugate-13 05/14/2013   Pneumococcal Polysaccharide-23 06/26/2020   Respiratory Syncytial Virus Vaccine,Recomb Aduvanted(Arexvy) 03/24/2022   Tdap 05/14/2013, 05/09/2023   Unspecified SARS-COV-2 Vaccination 04/11/2023   Zoster Recombinant(Shingrix) 01/29/2021   Zoster, Unspecified 07/17/2021    Screening Tests Health Maintenance  Topic Date Due   Medicare Annual Wellness (AWV)  03/23/2024   Mammogram  03/08/2024   COVID-19 Vaccine (7 - 2024-25 season)  09/23/2024   Colonoscopy  01/21/2030   DTaP/Tdap/Td (3 - Td or Tdap) 05/08/2033   Pneumococcal Vaccine: 50+ Years  Completed   Influenza Vaccine  Completed   DEXA SCAN  Completed   Hepatitis C Screening  Completed   Zoster Vaccines- Shingrix  Completed   HPV VACCINES  Aged Out   Meningococcal B Vaccine  Aged Out    Health Maintenance Items Addressed: See Nurse Notes at the end of this note  Additional Screening:  Vision Screening: Recommended annual ophthalmology exams for early detection of glaucoma and other disorders of the eye. Is the patient up to date with their annual eye exam?  No  Who is the provider or what is the name of the office in which the patient attends annual eye exams? [facility]/Dr. Joshua  Dental Screening: Recommended annual dental exams for proper oral hygiene  Community Resource Referral / Chronic Care Management: CRR required this visit?  No   CCM required this visit?  No   Plan:    I have personally reviewed and noted the following in the patient's chart:   Medical and  social history Use of alcohol, tobacco or illicit drugs  Current medications and supplements including opioid prescriptions. Patient is not currently taking opioid prescriptions. Functional ability and status Nutritional status Physical activity Advanced directives List of other physicians Hospitalizations, surgeries, and ER visits in previous 12 months Vitals Screenings to include cognitive, depression, and falls Referrals and appointments  In addition, I have reviewed and discussed with patient certain preventive protocols, quality metrics, and best practice recommendations. A written personalized care plan for preventive services as well as general preventive health recommendations were provided to patient.   Camellia Popescu L Lashann Hagg, CMA   04/09/2024   After Visit Summary: (MyChart) Due to this being a telephonic visit, the after visit summary with patients personalized plan was offered to patient via MyChart   Notes: Patient is due for a mamogram, which she stated that she will call to get scheduled.  She had no other concerns to address today.

## 2024-04-09 NOTE — Patient Instructions (Signed)
 [name],  Thank you for taking the time for your Medicare Wellness Visit. I appreciate your continued commitment to your health goals. Please review the care plan we discussed, and feel free to reach out if I can assist you further.  Medicare recommends these wellness visits once per year to help you and your care team stay ahead of potential health issues. These visits are designed to focus on prevention, allowing your provider to concentrate on managing your acute and chronic conditions during your regular appointments.  Please note that Annual Wellness Visits do not include a physical exam. Some assessments may be limited, especially if the visit was conducted virtually. If needed, we may recommend a separate in-person follow-up with your provider.  Ongoing Care Seeing your primary care provider every 3 to 6 months helps us  monitor your health and provide consistent, personalized care. Last office visit on 01/24/2024.    Referrals If a referral was made during today's visit and you haven't received any updates within two weeks, please contact the referred provider directly to check on the status.  Recommended Screenings:  Health Maintenance  Topic Date Due   Medicare Annual Wellness Visit  03/23/2024   Breast Cancer Screening  03/08/2024   COVID-19 Vaccine (7 - 2024-25 season) 09/23/2024   Colon Cancer Screening  01/21/2030   DTaP/Tdap/Td vaccine (3 - Td or Tdap) 05/08/2033   Pneumococcal Vaccine for age over 63  Completed   Flu Shot  Completed   DEXA scan (bone density measurement)  Completed   Hepatitis C Screening  Completed   Zoster (Shingles) Vaccine  Completed   HPV Vaccine  Aged Out   Meningitis B Vaccine  Aged Out       04/09/2024   10:02 AM  Advanced Directives  Does Patient Have a Medical Advance Directive? Yes  Type of Estate agent of Anna;Living will  Copy of Healthcare Power of Attorney in Chart? No - copy requested   Advance Care  Planning is important because it: Ensures you receive medical care that aligns with your values, goals, and preferences. Provides guidance to your family and loved ones, reducing the emotional burden of decision-making during critical moments.  Vision: Annual vision screenings are recommended for early detection of glaucoma, cataracts, and diabetic retinopathy. These exams can also reveal signs of chronic conditions such as diabetes and high blood pressure.  Dental: Annual dental screenings help detect early signs of oral cancer, gum disease, and other conditions linked to overall health, including heart disease and diabetes.  Please see the attached documents for additional preventive care recommendations.

## 2024-04-10 ENCOUNTER — Other Ambulatory Visit: Payer: Self-pay | Admitting: Family Medicine

## 2024-04-10 DIAGNOSIS — Z1231 Encounter for screening mammogram for malignant neoplasm of breast: Secondary | ICD-10-CM

## 2024-04-18 ENCOUNTER — Encounter: Payer: Self-pay | Admitting: Family

## 2024-04-18 ENCOUNTER — Ambulatory Visit: Admission: RE | Admit: 2024-04-18 | Discharge: 2024-04-18 | Disposition: A | Source: Ambulatory Visit

## 2024-04-18 DIAGNOSIS — Z1231 Encounter for screening mammogram for malignant neoplasm of breast: Secondary | ICD-10-CM

## 2024-04-23 ENCOUNTER — Other Ambulatory Visit: Payer: Self-pay | Admitting: Family Medicine

## 2024-04-23 DIAGNOSIS — R928 Other abnormal and inconclusive findings on diagnostic imaging of breast: Secondary | ICD-10-CM

## 2024-04-24 ENCOUNTER — Ambulatory Visit: Payer: Self-pay | Admitting: Family Medicine

## 2024-05-02 ENCOUNTER — Ambulatory Visit
Admission: RE | Admit: 2024-05-02 | Discharge: 2024-05-02 | Disposition: A | Discharge status: Home / Self Care | Source: Ambulatory Visit | Attending: Family Medicine | Admitting: Family Medicine

## 2024-05-02 DIAGNOSIS — R928 Other abnormal and inconclusive findings on diagnostic imaging of breast: Secondary | ICD-10-CM

## 2024-05-03 ENCOUNTER — Ambulatory Visit: Payer: Self-pay | Admitting: Family Medicine

## 2024-05-30 ENCOUNTER — Other Ambulatory Visit: Payer: Self-pay | Admitting: Family Medicine

## 2024-05-30 DIAGNOSIS — I10 Essential (primary) hypertension: Secondary | ICD-10-CM

## 2024-06-14 ENCOUNTER — Other Ambulatory Visit: Payer: Self-pay | Admitting: Podiatry

## 2024-06-15 ENCOUNTER — Ambulatory Visit: Admitting: Family Medicine

## 2024-06-20 ENCOUNTER — Ambulatory Visit: Admitting: Family Medicine

## 2024-06-20 VITALS — BP 134/86 | HR 71 | Temp 98.0°F | Ht 68.0 in | Wt 167.0 lb

## 2024-06-20 DIAGNOSIS — K439 Ventral hernia without obstruction or gangrene: Secondary | ICD-10-CM

## 2024-06-20 NOTE — Patient Instructions (Addendum)
-  It was great to see you today. -Placed an urgent referral to [facility] Surgery to evaluate hernia on right side of abdomen above umbilical area. Please call the office or send a MyChart message if you do not receive a phone call or a MyChart message about appointment in 1 week.  -Discussed about reason for emergent follow up with emergency department if hernia causes further concerns until seen by surgeon.

## 2024-06-20 NOTE — Progress Notes (Signed)
° °  Established Patient Office Visit   Subjective:  Patient ID: Ann Barton, female    DOB: 13-Feb-1956  Age: 68 y.o. MRN: [id number]  Chief Complaint  Patient presents with   Hernia    HPI Patient reports she is having more discomfort and complications with reducing her hernia that has been present for 7-8 years. Hernia is located above umbilical on the right side.  ROS See HPI above     Objective:   BP 134/86   Pulse 71   Temp 98 F (36.7 C) (Oral)   Ht 5' 8 (1.727 m)   Wt 167 lb (75.8 kg)   SpO2 98%   BMI 25.39 kg/m    Physical Exam Constitutional:      General: She is not in acute distress.    Appearance: Normal appearance. She is not ill-appearing.  HENT:     Head: Normocephalic.  Cardiovascular:     Rate and Rhythm: Normal rate.  Pulmonary:     Effort: Pulmonary effort is normal. No respiratory distress.  Abdominal:     Hernia: A hernia is present.   Skin:    General: Skin is warm and dry.  Neurological:     General: No focal deficit present.     Mental Status: She is alert and oriented to person, place, and time.  Psychiatric:        Mood and Affect: Mood normal.        Behavior: Behavior normal.        Thought Content: Thought content normal.        Judgment: Judgment normal.      Assessment & Plan:  Hernia of abdominal wall -     Ambulatory referral to General Surgery  -Placed an urgent referral to [facility] Surgery to evaluate hernia on right side of abdomen above umbilical area. Please call the office or send a MyChart message if you do not receive a phone call or a MyChart message about appointment in 1 week.  -Discussed about reason for emergent follow up with emergency department if hernia causes further concerns until seen by surgeon.   Annalysia Willenbring, NP

## 2024-07-13 ENCOUNTER — Telehealth: Admitting: Physician Assistant

## 2024-07-13 DIAGNOSIS — R3989 Other symptoms and signs involving the genitourinary system: Secondary | ICD-10-CM | POA: Diagnosis not present

## 2024-07-13 MED ORDER — CEPHALEXIN 500 MG PO CAPS: 500.0000 mg | ORAL_CAPSULE | Freq: Two times a day (BID) | ORAL | 0 refills | Status: AC

## 2024-07-13 NOTE — Progress Notes (Signed)

## 2024-07-25 NOTE — Progress Notes (Signed)
" ° °  Ann Barton 1956/05/20 [id number]   History:  68 y.o.  female G2P2002 here for annual exam and pessary check. H/O hysterectomy. She has a #6 ring pessary to control her cystocele. She takes the pessary out at night and replaces it in the am. Uses estrogen cream 2 x a week at hs. Denies vaginal bleeding. No abnormal vaginal discharge.   Gynecologic History Hysterectomy   Risk Factors for Medicare Patients >/= 5 sexual partners in a lifetime: No First intercourse <68 years of age: No H/O STD at any age: No Abnormal pap smear, < 3 negative paps within the last 7 years: No DES exposure (women born between [phone]): No Patient is on post breast cancer medication like Femara or, if medication like this is not needed, 5 years post breast cancer diagnosis: No   Health Maintenance Last Pap: 2020. Results were: normal Last mammogram: 04/18/24. Results were:normal  Last colonoscopy: 01/2020 Last Dexa: 03/17/23. Results were: osteoporosis, managed by PCP     Past medical history, past surgical history, family history and social history were all reviewed and documented in the EPIC chart.  ROS:  A ROS was performed and pertinent positives and negatives are included.  Exam:  Vitals:   07/26/24 1036  BP: 116/82  Pulse: 84  SpO2: 99%  Weight: 164 lb (74.4 kg)  Height: 5' 7.5 (1.715 m)   Body mass index is 25.31 kg/m.  General appearance:  Normal Thyroid :  Symmetrical, normal in size, without palpable masses or nodularity. Respiratory  Auscultation:  Clear without wheezing or rhonchi Cardiovascular  Auscultation:  Regular rate, without rubs, murmurs or gallops  Edema/varicosities:  Not grossly evident Abdominal  Soft,nontender, without masses, guarding or rebound.  Liver/spleen:  No organomegaly noted  Hernia:  None appreciated  Skin  Inspection:  Grossly normal Breasts: Examined lying and sitting.   Right: Without masses, retractions, nipple discharge or axillary  adenopathy.   Left: Without masses, retractions, nipple discharge or axillary adenopathy. Genitourinary   Inguinal/mons:  Normal without inguinal adenopathy  External genitalia:  Normal appearing vulva with no masses, tenderness, or lesions  BUS/Urethra/Skene's glands:  Normal  Vagina:  #6 ring pessary with support removed without difficulty cleaned. Tolerated well. Vaginal tissues intact. No bleeding or discharge.   Cervix:  absent  Uterus:  absent  Adnexa/parametria:     Rt: Normal in size, without masses or tenderness.   Lt: Normal in size, without masses or tenderness.  Anus and perineum: Normal   Ann Barton, CMA present for exam  Assessment/Plan:   1. Encounter for breast and pelvic examination (Primary) Repeat 2 years, low risk  2. Cystocele with prolapse  3. Pessary maintenance  4. Vaginal irritation from pessary Leave pessary in for longer periods of time, increase estrogen use to 3 times/week   Return in 6months for pessary check  Ann Barton B WHNP-BC 11:06 AM 07/26/2024  "

## 2024-07-26 ENCOUNTER — Encounter: Payer: Self-pay | Admitting: Radiology

## 2024-07-26 ENCOUNTER — Ambulatory Visit: Admitting: Radiology

## 2024-07-26 VITALS — BP 116/82 | HR 84 | Ht 67.5 in | Wt 164.0 lb

## 2024-07-26 DIAGNOSIS — N898 Other specified noninflammatory disorders of vagina: Secondary | ICD-10-CM

## 2024-07-26 DIAGNOSIS — Z01419 Encounter for gynecological examination (general) (routine) without abnormal findings: Secondary | ICD-10-CM | POA: Diagnosis not present

## 2024-07-26 DIAGNOSIS — N814 Uterovaginal prolapse, unspecified: Secondary | ICD-10-CM | POA: Diagnosis not present

## 2024-07-26 DIAGNOSIS — Z4689 Encounter for fitting and adjustment of other specified devices: Secondary | ICD-10-CM

## 2024-07-26 DIAGNOSIS — T8389XA Other specified complication of genitourinary prosthetic devices, implants and grafts, initial encounter: Secondary | ICD-10-CM

## 2025-01-23 ENCOUNTER — Ambulatory Visit: Admitting: Radiology

## 2025-02-18 ENCOUNTER — Encounter: Admitting: Family Medicine
